# Patient Record
Sex: Female | Born: 1935 | Race: White | Hispanic: No | Smoking: Current every day smoker
Health system: Southern US, Community
[De-identification: ages and names within clinical notes are randomized; demographics above are authoritative.]

## PROBLEM LIST (undated history)

## (undated) DIAGNOSIS — D751 Secondary polycythemia: Secondary | ICD-10-CM

## (undated) DIAGNOSIS — D333 Benign neoplasm of cranial nerves: Secondary | ICD-10-CM

## (undated) DIAGNOSIS — I739 Peripheral vascular disease, unspecified: Secondary | ICD-10-CM

## (undated) DIAGNOSIS — E785 Hyperlipidemia, unspecified: Secondary | ICD-10-CM

## (undated) DIAGNOSIS — Z96651 Presence of right artificial knee joint: Secondary | ICD-10-CM

## (undated) DIAGNOSIS — Z95828 Presence of other vascular implants and grafts: Secondary | ICD-10-CM

## (undated) DIAGNOSIS — Z950 Presence of cardiac pacemaker: Secondary | ICD-10-CM

## (undated) DIAGNOSIS — G459 Transient cerebral ischemic attack, unspecified: Secondary | ICD-10-CM

## (undated) DIAGNOSIS — G8929 Other chronic pain: Secondary | ICD-10-CM

## (undated) DIAGNOSIS — S22000A Wedge compression fracture of unspecified thoracic vertebra, initial encounter for closed fracture: Secondary | ICD-10-CM

## (undated) DIAGNOSIS — S62101A Fracture of unspecified carpal bone, right wrist, initial encounter for closed fracture: Secondary | ICD-10-CM

## (undated) DIAGNOSIS — M549 Dorsalgia, unspecified: Secondary | ICD-10-CM

## (undated) DIAGNOSIS — J449 Chronic obstructive pulmonary disease, unspecified: Secondary | ICD-10-CM

## (undated) HISTORY — DX: Secondary polycythemia: D75.1

## (undated) HISTORY — PX: OTHER SURGICAL HISTORY: SHX169

## (undated) HISTORY — PX: BACK SURGERY: SHX140

## (undated) HISTORY — PX: CHOLECYSTECTOMY: SHX55

## (undated) HISTORY — PX: REPLACEMENT TOTAL KNEE: SUR1224

## (undated) HISTORY — PX: TONSILLECTOMY: SUR1361

## (undated) HISTORY — PX: APPENDECTOMY: SHX54

## (undated) HISTORY — PX: ABDOMINAL HYSTERECTOMY: SHX81

## (undated) HISTORY — DX: Benign neoplasm of cranial nerves: D33.3

---

## 2004-04-22 ENCOUNTER — Other Ambulatory Visit: Payer: Self-pay

## 2004-06-20 ENCOUNTER — Ambulatory Visit: Payer: Self-pay | Admitting: Unknown Physician Specialty

## 2004-07-04 ENCOUNTER — Ambulatory Visit: Payer: Self-pay | Admitting: Unknown Physician Specialty

## 2004-07-30 ENCOUNTER — Ambulatory Visit: Payer: Self-pay | Admitting: Surgery

## 2004-07-30 ENCOUNTER — Other Ambulatory Visit: Payer: Self-pay

## 2004-08-06 ENCOUNTER — Ambulatory Visit: Payer: Self-pay | Admitting: Surgery

## 2004-10-09 ENCOUNTER — Ambulatory Visit: Payer: Self-pay | Admitting: Unknown Physician Specialty

## 2004-10-22 ENCOUNTER — Other Ambulatory Visit: Payer: Self-pay

## 2004-10-28 ENCOUNTER — Inpatient Hospital Stay: Payer: Self-pay | Admitting: Unknown Physician Specialty

## 2005-07-01 ENCOUNTER — Other Ambulatory Visit: Payer: Self-pay

## 2005-07-01 ENCOUNTER — Emergency Department: Payer: Self-pay | Admitting: Emergency Medicine

## 2005-12-25 ENCOUNTER — Other Ambulatory Visit: Payer: Self-pay

## 2005-12-25 ENCOUNTER — Inpatient Hospital Stay: Payer: Self-pay | Admitting: Internal Medicine

## 2005-12-26 ENCOUNTER — Other Ambulatory Visit: Payer: Self-pay

## 2005-12-30 ENCOUNTER — Ambulatory Visit: Payer: Self-pay | Admitting: Internal Medicine

## 2006-01-05 ENCOUNTER — Ambulatory Visit: Payer: Self-pay | Admitting: Internal Medicine

## 2006-02-05 ENCOUNTER — Ambulatory Visit: Payer: Self-pay | Admitting: Internal Medicine

## 2006-02-25 ENCOUNTER — Inpatient Hospital Stay: Payer: Self-pay | Admitting: Internal Medicine

## 2006-03-07 ENCOUNTER — Ambulatory Visit: Payer: Self-pay | Admitting: Internal Medicine

## 2006-04-07 ENCOUNTER — Ambulatory Visit: Payer: Self-pay | Admitting: Internal Medicine

## 2006-04-23 ENCOUNTER — Ambulatory Visit: Payer: Self-pay | Admitting: Unknown Physician Specialty

## 2006-05-08 ENCOUNTER — Ambulatory Visit: Payer: Self-pay | Admitting: Internal Medicine

## 2006-06-16 ENCOUNTER — Other Ambulatory Visit: Payer: Self-pay

## 2006-06-24 ENCOUNTER — Inpatient Hospital Stay: Payer: Self-pay | Admitting: Unknown Physician Specialty

## 2006-06-25 ENCOUNTER — Other Ambulatory Visit: Payer: Self-pay

## 2006-07-01 ENCOUNTER — Ambulatory Visit: Payer: Self-pay | Admitting: Internal Medicine

## 2006-07-14 ENCOUNTER — Ambulatory Visit: Payer: Self-pay | Admitting: Internal Medicine

## 2006-08-11 ENCOUNTER — Ambulatory Visit: Payer: Self-pay | Admitting: Internal Medicine

## 2006-08-13 ENCOUNTER — Ambulatory Visit: Payer: Self-pay | Admitting: Internal Medicine

## 2006-08-19 ENCOUNTER — Ambulatory Visit: Payer: Self-pay | Admitting: Internal Medicine

## 2006-09-07 ENCOUNTER — Ambulatory Visit: Payer: Self-pay | Admitting: Internal Medicine

## 2006-10-08 ENCOUNTER — Ambulatory Visit: Payer: Self-pay | Admitting: Internal Medicine

## 2006-11-06 ENCOUNTER — Ambulatory Visit: Payer: Self-pay | Admitting: Internal Medicine

## 2006-12-07 ENCOUNTER — Ambulatory Visit: Payer: Self-pay | Admitting: Internal Medicine

## 2007-01-06 ENCOUNTER — Ambulatory Visit: Payer: Self-pay | Admitting: Internal Medicine

## 2007-02-06 ENCOUNTER — Ambulatory Visit: Payer: Self-pay | Admitting: Internal Medicine

## 2007-02-16 ENCOUNTER — Ambulatory Visit: Payer: Self-pay

## 2007-02-21 ENCOUNTER — Ambulatory Visit: Payer: Self-pay | Admitting: Internal Medicine

## 2007-03-08 ENCOUNTER — Ambulatory Visit: Payer: Self-pay | Admitting: Internal Medicine

## 2007-04-08 ENCOUNTER — Ambulatory Visit: Payer: Self-pay | Admitting: Internal Medicine

## 2007-05-09 ENCOUNTER — Ambulatory Visit: Payer: Self-pay | Admitting: Internal Medicine

## 2007-06-08 ENCOUNTER — Ambulatory Visit: Payer: Self-pay | Admitting: Internal Medicine

## 2007-07-09 ENCOUNTER — Ambulatory Visit: Payer: Self-pay | Admitting: Internal Medicine

## 2007-08-08 ENCOUNTER — Ambulatory Visit: Payer: Self-pay | Admitting: Internal Medicine

## 2007-09-08 ENCOUNTER — Ambulatory Visit: Payer: Self-pay | Admitting: Internal Medicine

## 2007-10-09 ENCOUNTER — Ambulatory Visit: Payer: Self-pay | Admitting: Internal Medicine

## 2007-11-06 ENCOUNTER — Ambulatory Visit: Payer: Self-pay | Admitting: Internal Medicine

## 2007-12-07 ENCOUNTER — Ambulatory Visit: Payer: Self-pay | Admitting: Internal Medicine

## 2008-01-06 ENCOUNTER — Ambulatory Visit: Payer: Self-pay | Admitting: Internal Medicine

## 2008-02-06 ENCOUNTER — Ambulatory Visit: Payer: Self-pay | Admitting: Internal Medicine

## 2008-02-14 ENCOUNTER — Ambulatory Visit: Payer: Self-pay

## 2008-02-20 ENCOUNTER — Ambulatory Visit: Payer: Self-pay | Admitting: Internal Medicine

## 2008-03-07 ENCOUNTER — Ambulatory Visit: Payer: Self-pay | Admitting: Internal Medicine

## 2008-04-07 ENCOUNTER — Ambulatory Visit: Payer: Self-pay | Admitting: Internal Medicine

## 2008-05-08 ENCOUNTER — Ambulatory Visit: Payer: Self-pay | Admitting: Internal Medicine

## 2008-06-07 ENCOUNTER — Ambulatory Visit: Payer: Self-pay | Admitting: Internal Medicine

## 2008-06-21 ENCOUNTER — Ambulatory Visit: Payer: Self-pay | Admitting: Internal Medicine

## 2008-07-08 ENCOUNTER — Ambulatory Visit: Payer: Self-pay | Admitting: Internal Medicine

## 2008-07-19 ENCOUNTER — Ambulatory Visit: Payer: Self-pay | Admitting: Internal Medicine

## 2008-08-07 ENCOUNTER — Ambulatory Visit: Payer: Self-pay | Admitting: Internal Medicine

## 2008-08-16 ENCOUNTER — Ambulatory Visit: Payer: Self-pay | Admitting: Internal Medicine

## 2008-08-20 ENCOUNTER — Ambulatory Visit: Payer: Self-pay | Admitting: Internal Medicine

## 2008-09-07 ENCOUNTER — Ambulatory Visit: Payer: Self-pay | Admitting: Internal Medicine

## 2008-10-08 ENCOUNTER — Ambulatory Visit: Payer: Self-pay | Admitting: Internal Medicine

## 2008-11-05 ENCOUNTER — Ambulatory Visit: Payer: Self-pay | Admitting: Internal Medicine

## 2008-12-06 ENCOUNTER — Ambulatory Visit: Payer: Self-pay | Admitting: Internal Medicine

## 2009-01-05 ENCOUNTER — Ambulatory Visit: Payer: Self-pay | Admitting: Internal Medicine

## 2009-02-05 ENCOUNTER — Ambulatory Visit: Payer: Self-pay | Admitting: Internal Medicine

## 2009-03-07 ENCOUNTER — Ambulatory Visit: Payer: Self-pay | Admitting: Internal Medicine

## 2009-04-07 ENCOUNTER — Ambulatory Visit: Payer: Self-pay | Admitting: Internal Medicine

## 2009-05-08 ENCOUNTER — Ambulatory Visit: Payer: Self-pay | Admitting: Internal Medicine

## 2009-06-07 ENCOUNTER — Ambulatory Visit: Payer: Self-pay | Admitting: Internal Medicine

## 2009-07-08 ENCOUNTER — Ambulatory Visit: Payer: Self-pay | Admitting: Internal Medicine

## 2009-08-07 ENCOUNTER — Ambulatory Visit: Payer: Self-pay | Admitting: Internal Medicine

## 2009-08-15 ENCOUNTER — Ambulatory Visit: Payer: Self-pay | Admitting: Internal Medicine

## 2009-09-07 ENCOUNTER — Ambulatory Visit: Payer: Self-pay | Admitting: Internal Medicine

## 2009-10-08 ENCOUNTER — Ambulatory Visit: Payer: Self-pay | Admitting: Internal Medicine

## 2009-11-05 ENCOUNTER — Ambulatory Visit: Payer: Self-pay | Admitting: Internal Medicine

## 2009-12-06 ENCOUNTER — Ambulatory Visit: Payer: Self-pay | Admitting: Internal Medicine

## 2009-12-31 ENCOUNTER — Ambulatory Visit: Payer: Self-pay | Admitting: Internal Medicine

## 2010-01-05 ENCOUNTER — Ambulatory Visit: Payer: Self-pay | Admitting: Internal Medicine

## 2010-02-17 ENCOUNTER — Ambulatory Visit: Payer: Self-pay | Admitting: Family Medicine

## 2010-02-27 ENCOUNTER — Ambulatory Visit: Payer: Self-pay | Admitting: Internal Medicine

## 2010-03-07 ENCOUNTER — Ambulatory Visit: Payer: Self-pay | Admitting: Internal Medicine

## 2010-03-07 ENCOUNTER — Ambulatory Visit: Payer: Self-pay | Admitting: Otolaryngology

## 2010-03-08 ENCOUNTER — Ambulatory Visit: Payer: Self-pay | Admitting: Internal Medicine

## 2010-04-07 ENCOUNTER — Ambulatory Visit: Payer: Self-pay | Admitting: Internal Medicine

## 2010-05-08 ENCOUNTER — Ambulatory Visit: Payer: Self-pay | Admitting: Internal Medicine

## 2010-06-07 ENCOUNTER — Ambulatory Visit: Payer: Self-pay | Admitting: Internal Medicine

## 2010-07-08 ENCOUNTER — Ambulatory Visit: Payer: Self-pay

## 2010-07-08 ENCOUNTER — Ambulatory Visit: Payer: Self-pay | Admitting: Internal Medicine

## 2010-08-07 ENCOUNTER — Ambulatory Visit: Payer: Self-pay | Admitting: Internal Medicine

## 2010-09-07 ENCOUNTER — Ambulatory Visit: Payer: Self-pay | Admitting: Internal Medicine

## 2010-10-08 ENCOUNTER — Ambulatory Visit: Payer: Self-pay | Admitting: Internal Medicine

## 2010-11-06 ENCOUNTER — Ambulatory Visit: Payer: Self-pay | Admitting: Internal Medicine

## 2010-12-07 ENCOUNTER — Ambulatory Visit: Payer: Self-pay | Admitting: Internal Medicine

## 2011-01-06 ENCOUNTER — Ambulatory Visit: Payer: Self-pay | Admitting: Internal Medicine

## 2011-02-06 ENCOUNTER — Ambulatory Visit: Payer: Self-pay | Admitting: Internal Medicine

## 2011-03-08 ENCOUNTER — Ambulatory Visit: Payer: Self-pay | Admitting: Internal Medicine

## 2011-04-08 ENCOUNTER — Ambulatory Visit: Payer: Self-pay | Admitting: Internal Medicine

## 2011-05-09 ENCOUNTER — Ambulatory Visit: Payer: Self-pay | Admitting: Internal Medicine

## 2011-06-08 ENCOUNTER — Ambulatory Visit: Payer: Self-pay | Admitting: Internal Medicine

## 2011-07-09 ENCOUNTER — Ambulatory Visit: Payer: Self-pay | Admitting: Internal Medicine

## 2011-07-14 ENCOUNTER — Ambulatory Visit: Payer: Self-pay | Admitting: Vascular Surgery

## 2011-08-05 ENCOUNTER — Ambulatory Visit: Payer: Self-pay | Admitting: Internal Medicine

## 2011-08-08 ENCOUNTER — Ambulatory Visit: Payer: Self-pay | Admitting: Internal Medicine

## 2011-09-08 ENCOUNTER — Ambulatory Visit: Payer: Self-pay | Admitting: Internal Medicine

## 2011-09-17 LAB — CANCER CENTER HEMATOCRIT: HCT: 42.7 % (ref 35.0–47.0)

## 2011-10-08 LAB — CANCER CENTER HEMATOCRIT: HCT: 43.3 % (ref 35.0–47.0)

## 2011-10-09 ENCOUNTER — Ambulatory Visit: Payer: Self-pay | Admitting: Internal Medicine

## 2011-11-05 LAB — CBC CANCER CENTER
Basophil #: 0 x10 3/mm (ref 0.0–0.1)
Basophil %: 1.1 %
Eosinophil #: 0.1 x10 3/mm (ref 0.0–0.7)
HCT: 40 % (ref 35.0–47.0)
Lymphocyte #: 1.2 x10 3/mm (ref 1.0–3.6)
Lymphocyte %: 29.9 %
MCH: 24.1 pg — ABNORMAL LOW (ref 26.0–34.0)
MCHC: 32.2 g/dL (ref 32.0–36.0)
MCV: 75 fL — ABNORMAL LOW (ref 80–100)
Monocyte #: 0.5 x10 3/mm (ref 0.0–0.7)
Neutrophil #: 2.2 x10 3/mm (ref 1.4–6.5)
Neutrophil %: 55.4 %
RBC: 5.33 10*6/uL — ABNORMAL HIGH (ref 3.80–5.20)
RDW: 17.4 % — ABNORMAL HIGH (ref 11.5–14.5)
WBC: 4.1 x10 3/mm (ref 3.6–11.0)

## 2011-11-06 ENCOUNTER — Ambulatory Visit: Payer: Self-pay | Admitting: Internal Medicine

## 2011-12-03 LAB — CBC CANCER CENTER
Basophil #: 0 x10 3/mm (ref 0.0–0.1)
Eosinophil #: 0.1 x10 3/mm (ref 0.0–0.7)
Eosinophil %: 1.6 %
HGB: 13.3 g/dL (ref 12.0–16.0)
MCH: 23.9 pg — ABNORMAL LOW (ref 26.0–34.0)
Monocyte #: 0.4 x10 3/mm (ref 0.0–0.7)
Monocyte %: 10.1 %
RBC: 5.55 10*6/uL — ABNORMAL HIGH (ref 3.80–5.20)

## 2011-12-07 ENCOUNTER — Ambulatory Visit: Payer: Self-pay | Admitting: Internal Medicine

## 2011-12-17 LAB — CANCER CENTER HEMATOCRIT: HCT: 41.7 % (ref 35.0–47.0)

## 2012-01-06 ENCOUNTER — Ambulatory Visit: Payer: Self-pay | Admitting: Internal Medicine

## 2012-01-14 LAB — CBC CANCER CENTER
Basophil %: 1.4 %
Eosinophil %: 2.3 %
HCT: 42.9 % (ref 35.0–47.0)
HGB: 13.5 g/dL (ref 12.0–16.0)
Lymphocyte #: 1.3 x10 3/mm (ref 1.0–3.6)
Lymphocyte %: 30.1 %
MCH: 23.9 pg — ABNORMAL LOW (ref 26.0–34.0)
Monocyte #: 0.6 x10 3/mm (ref 0.2–0.9)
Monocyte %: 14.4 %
Neutrophil #: 2.2 x10 3/mm (ref 1.4–6.5)
RBC: 5.65 10*6/uL — ABNORMAL HIGH (ref 3.80–5.20)
RDW: 19.4 % — ABNORMAL HIGH (ref 11.5–14.5)

## 2012-02-06 ENCOUNTER — Ambulatory Visit: Payer: Self-pay | Admitting: Internal Medicine

## 2012-02-19 LAB — CBC CANCER CENTER
Basophil #: 0 x10 3/mm (ref 0.0–0.1)
Eosinophil #: 0.1 x10 3/mm (ref 0.0–0.7)
Eosinophil %: 2.3 %
HCT: 41.8 % (ref 35.0–47.0)
Lymphocyte #: 1.3 x10 3/mm (ref 1.0–3.6)
MCH: 24 pg — ABNORMAL LOW (ref 26.0–34.0)
Monocyte #: 0.6 x10 3/mm (ref 0.2–0.9)
Monocyte %: 12 %
Neutrophil #: 2.6 x10 3/mm (ref 1.4–6.5)
Platelet: 369 x10 3/mm (ref 150–440)
WBC: 4.6 x10 3/mm (ref 3.6–11.0)

## 2012-03-07 ENCOUNTER — Ambulatory Visit: Payer: Self-pay | Admitting: Internal Medicine

## 2012-03-18 LAB — CBC CANCER CENTER
Basophil #: 0 x10 3/mm (ref 0.0–0.1)
Eosinophil #: 0.1 x10 3/mm (ref 0.0–0.7)
HCT: 40 % (ref 35.0–47.0)
Lymphocyte #: 1.3 x10 3/mm (ref 1.0–3.6)
Lymphocyte %: 30.8 %
MCH: 23.7 pg — ABNORMAL LOW (ref 26.0–34.0)
MCHC: 31.3 g/dL — ABNORMAL LOW (ref 32.0–36.0)
Monocyte %: 13.6 %
Platelet: 335 x10 3/mm (ref 150–440)
RDW: 17.9 % — ABNORMAL HIGH (ref 11.5–14.5)
WBC: 4.1 x10 3/mm (ref 3.6–11.0)

## 2012-04-07 ENCOUNTER — Ambulatory Visit: Payer: Self-pay | Admitting: Internal Medicine

## 2012-04-08 LAB — CBC CANCER CENTER
Basophil #: 0.1 x10 3/mm (ref 0.0–0.1)
Eosinophil #: 0.1 x10 3/mm (ref 0.0–0.7)
HGB: 13.5 g/dL (ref 12.0–16.0)
Lymphocyte %: 23.5 %
MCH: 24.3 pg — ABNORMAL LOW (ref 26.0–34.0)
MCHC: 31.7 g/dL — ABNORMAL LOW (ref 32.0–36.0)
Neutrophil #: 3.1 x10 3/mm (ref 1.4–6.5)
Neutrophil %: 62.1 %
Platelet: 333 x10 3/mm (ref 150–440)
WBC: 4.9 x10 3/mm (ref 3.6–11.0)

## 2012-05-08 ENCOUNTER — Ambulatory Visit: Payer: Self-pay | Admitting: Internal Medicine

## 2012-05-20 LAB — CANCER CENTER HEMATOCRIT: HCT: 41 % (ref 35.0–47.0)

## 2012-06-07 ENCOUNTER — Ambulatory Visit: Payer: Self-pay | Admitting: Internal Medicine

## 2012-07-08 ENCOUNTER — Ambulatory Visit: Payer: Self-pay | Admitting: Internal Medicine

## 2012-07-08 LAB — CBC CANCER CENTER
Basophil %: 1.4 %
Eosinophil #: 0.1 x10 3/mm (ref 0.0–0.7)
Eosinophil %: 2.1 %
HGB: 13.2 g/dL (ref 12.0–16.0)
Lymphocyte %: 31.3 %
MCHC: 31.2 g/dL — ABNORMAL LOW (ref 32.0–36.0)
MCV: 78 fL — ABNORMAL LOW (ref 80–100)
Monocyte %: 13.6 %
Neutrophil #: 2.3 x10 3/mm (ref 1.4–6.5)
Neutrophil %: 51.6 %
RBC: 5.45 10*6/uL — ABNORMAL HIGH (ref 3.80–5.20)
RDW: 18.3 % — ABNORMAL HIGH (ref 11.5–14.5)

## 2012-08-07 ENCOUNTER — Ambulatory Visit: Payer: Self-pay | Admitting: Internal Medicine

## 2012-08-19 LAB — CANCER CENTER HEMATOCRIT: HCT: 40.6 % (ref 35.0–47.0)

## 2012-09-07 ENCOUNTER — Ambulatory Visit: Payer: Self-pay | Admitting: Internal Medicine

## 2012-09-23 LAB — CBC CANCER CENTER
Basophil #: 0 x10 3/mm (ref 0.0–0.1)
Eosinophil %: 2 %
Lymphocyte #: 1.3 x10 3/mm (ref 1.0–3.6)
MCHC: 32.3 g/dL (ref 32.0–36.0)
Monocyte %: 11.7 %
Neutrophil #: 1.8 x10 3/mm (ref 1.4–6.5)
Neutrophil %: 50.4 %
Platelet: 278 x10 3/mm (ref 150–440)
WBC: 3.6 x10 3/mm (ref 3.6–11.0)

## 2012-10-08 ENCOUNTER — Ambulatory Visit: Payer: Self-pay | Admitting: Internal Medicine

## 2012-10-27 ENCOUNTER — Encounter: Payer: Self-pay | Admitting: Unknown Physician Specialty

## 2012-11-05 ENCOUNTER — Ambulatory Visit: Payer: Self-pay | Admitting: Internal Medicine

## 2012-11-05 ENCOUNTER — Encounter: Payer: Self-pay | Admitting: Unknown Physician Specialty

## 2012-11-17 LAB — CBC CANCER CENTER
Basophil %: 1 %
Eosinophil #: 0.1 x10 3/mm (ref 0.0–0.7)
Eosinophil %: 1.5 %
HCT: 44.1 % (ref 35.0–47.0)
HGB: 14.2 g/dL (ref 12.0–16.0)
Lymphocyte #: 1.3 x10 3/mm (ref 1.0–3.6)
MCH: 26.3 pg (ref 26.0–34.0)
MCHC: 32.2 g/dL (ref 32.0–36.0)
MCV: 81 fL (ref 80–100)
Neutrophil #: 2.3 x10 3/mm (ref 1.4–6.5)
Neutrophil %: 54.7 %
RBC: 5.42 10*6/uL — ABNORMAL HIGH (ref 3.80–5.20)
RDW: 18.9 % — ABNORMAL HIGH (ref 11.5–14.5)
WBC: 4.2 x10 3/mm (ref 3.6–11.0)

## 2012-12-06 ENCOUNTER — Ambulatory Visit: Payer: Self-pay | Admitting: Internal Medicine

## 2012-12-08 LAB — CANCER CENTER HEMATOCRIT: HCT: 43.1 % (ref 35.0–47.0)

## 2012-12-09 DIAGNOSIS — R011 Cardiac murmur, unspecified: Secondary | ICD-10-CM | POA: Insufficient documentation

## 2013-01-05 ENCOUNTER — Ambulatory Visit: Payer: Self-pay | Admitting: Internal Medicine

## 2013-01-05 LAB — CBC CANCER CENTER
Eosinophil #: 0.1 x10 3/mm (ref 0.0–0.7)
Eosinophil %: 1.6 %
HCT: 42.6 % (ref 35.0–47.0)
HGB: 13.5 g/dL (ref 12.0–16.0)
Lymphocyte %: 30.4 %
MCH: 25.9 pg — ABNORMAL LOW (ref 26.0–34.0)
MCHC: 31.7 g/dL — ABNORMAL LOW (ref 32.0–36.0)
MCV: 82 fL (ref 80–100)
Monocyte %: 12 %
Neutrophil #: 2.2 x10 3/mm (ref 1.4–6.5)
Neutrophil %: 54.5 %
RBC: 5.21 10*6/uL — ABNORMAL HIGH (ref 3.80–5.20)
RDW: 16.8 % — ABNORMAL HIGH (ref 11.5–14.5)

## 2013-01-22 ENCOUNTER — Emergency Department: Payer: Self-pay | Admitting: Emergency Medicine

## 2013-01-26 LAB — CBC CANCER CENTER
Basophil #: 0 x10 3/mm (ref 0.0–0.1)
Basophil %: 0.4 %
Eosinophil %: 2 %
HCT: 40.5 % (ref 35.0–47.0)
HGB: 13.1 g/dL (ref 12.0–16.0)
Lymphocyte %: 32.1 %
MCH: 26.4 pg (ref 26.0–34.0)
MCHC: 32.4 g/dL (ref 32.0–36.0)
Monocyte #: 0.7 x10 3/mm (ref 0.2–0.9)
Monocyte %: 14 %
RBC: 4.97 10*6/uL (ref 3.80–5.20)

## 2013-02-05 ENCOUNTER — Ambulatory Visit: Payer: Self-pay | Admitting: Internal Medicine

## 2013-02-16 LAB — CBC CANCER CENTER
Basophil #: 0.1 x10 3/mm (ref 0.0–0.1)
Basophil %: 1.2 %
Eosinophil #: 0.1 x10 3/mm (ref 0.0–0.7)
HCT: 41.6 % (ref 35.0–47.0)
HGB: 13.8 g/dL (ref 12.0–16.0)
Lymphocyte #: 1.5 x10 3/mm (ref 1.0–3.6)
Lymphocyte %: 28 %
Monocyte #: 0.6 x10 3/mm (ref 0.2–0.9)
Neutrophil %: 58.6 %
Platelet: 321 x10 3/mm (ref 150–440)
RBC: 5.17 10*6/uL (ref 3.80–5.20)

## 2013-03-07 ENCOUNTER — Ambulatory Visit: Payer: Self-pay | Admitting: Internal Medicine

## 2013-03-29 LAB — CBC CANCER CENTER
Basophil #: 0.1 x10 3/mm (ref 0.0–0.1)
Basophil %: 0.8 %
Lymphocyte #: 1.5 x10 3/mm (ref 1.0–3.6)
Lymphocyte %: 24.6 %
MCV: 81 fL (ref 80–100)
Monocyte #: 0.7 x10 3/mm (ref 0.2–0.9)
Monocyte %: 12.1 %
Neutrophil #: 3.7 x10 3/mm (ref 1.4–6.5)
Platelet: 491 x10 3/mm — ABNORMAL HIGH (ref 150–440)
RBC: 5.38 10*6/uL — ABNORMAL HIGH (ref 3.80–5.20)
RDW: 18.2 % — ABNORMAL HIGH (ref 11.5–14.5)

## 2013-04-07 ENCOUNTER — Ambulatory Visit: Payer: Self-pay | Admitting: Internal Medicine

## 2013-04-19 LAB — CBC CANCER CENTER
Eosinophil %: 2 %
HCT: 42.3 % (ref 35.0–47.0)
HGB: 13.7 g/dL (ref 12.0–16.0)
Lymphocyte %: 24.6 %
MCV: 80 fL (ref 80–100)
Monocyte %: 10.9 %
Neutrophil #: 3.2 x10 3/mm (ref 1.4–6.5)
Platelet: 341 x10 3/mm (ref 150–440)
RBC: 5.27 10*6/uL — ABNORMAL HIGH (ref 3.80–5.20)
WBC: 5.3 x10 3/mm (ref 3.6–11.0)

## 2013-05-08 ENCOUNTER — Ambulatory Visit: Payer: Self-pay | Admitting: Internal Medicine

## 2013-05-17 LAB — CBC CANCER CENTER
Basophil #: 0.1 x10 3/mm (ref 0.0–0.1)
Basophil %: 1.1 %
Eosinophil %: 1.5 %
HGB: 14 g/dL (ref 12.0–16.0)
Lymphocyte #: 1.2 x10 3/mm (ref 1.0–3.6)
Lymphocyte %: 19.5 %
MCV: 80 fL (ref 80–100)
Monocyte #: 0.7 x10 3/mm (ref 0.2–0.9)
Neutrophil #: 4.1 x10 3/mm (ref 1.4–6.5)
Neutrophil %: 66.9 %
Platelet: 324 x10 3/mm (ref 150–440)
RBC: 5.38 10*6/uL — ABNORMAL HIGH (ref 3.80–5.20)
WBC: 6.1 x10 3/mm (ref 3.6–11.0)

## 2013-06-07 ENCOUNTER — Ambulatory Visit: Payer: Self-pay | Admitting: Internal Medicine

## 2013-06-21 LAB — CBC CANCER CENTER
Basophil #: 0 10*3/uL
Basophil %: 1.3 %
Eosinophil #: 0.1 10*3/uL
Eosinophil %: 2.2 %
HCT: 43 %
HGB: 13.8 g/dL
Lymphocyte %: 28.9 %
Lymphs Abs: 1.1 10*3/uL
MCH: 26.2 pg
MCHC: 32.2 g/dL
MCV: 82 fL
Monocyte #: 0.5 10*3/uL
Monocyte %: 13.6 %
Neutrophil #: 2.1 10*3/uL
Neutrophil %: 54 %
Platelet: 390 10*3/uL
RBC: 5.27 10*6/uL — ABNORMAL HIGH
RDW: 18.1 % — ABNORMAL HIGH
WBC: 4 10*3/uL

## 2013-06-27 DIAGNOSIS — I739 Peripheral vascular disease, unspecified: Secondary | ICD-10-CM | POA: Insufficient documentation

## 2013-06-28 LAB — COMPREHENSIVE METABOLIC PANEL
Albumin: 3 g/dL — ABNORMAL LOW (ref 3.4–5.0)
Alkaline Phosphatase: 80 U/L (ref 50–136)
Anion Gap: 7 (ref 7–16)
BUN: 6 mg/dL — ABNORMAL LOW (ref 7–18)
Bilirubin,Total: 0.3 mg/dL (ref 0.2–1.0)
Chloride: 105 mmol/L (ref 98–107)
Co2: 30 mmol/L (ref 21–32)
Creatinine: 0.92 mg/dL (ref 0.60–1.30)
EGFR (African American): >60
EGFR (Non-African Amer.): 56 — ABNORMAL LOW
Glucose: 92 mg/dL (ref 65–99)
SGPT (ALT): 16 U/L (ref 12–78)
Sodium: 142 mmol/L (ref 136–145)
Total Protein: 6.2 g/dL — ABNORMAL LOW (ref 6.4–8.2)

## 2013-06-28 LAB — LIPID PANEL
HDL Cholesterol: 45 mg/dL (ref 40–60)
Ldl Cholesterol, Calc: 21 mg/dL (ref 0–100)
VLDL Cholesterol, Calc: 31 mg/dL (ref 5–40)

## 2013-06-28 LAB — CANCER CENTER HEMATOCRIT: HCT: 43.2 % (ref 35.0–47.0)

## 2013-07-06 DIAGNOSIS — G8929 Other chronic pain: Secondary | ICD-10-CM | POA: Insufficient documentation

## 2013-07-08 ENCOUNTER — Ambulatory Visit: Payer: Self-pay | Admitting: Internal Medicine

## 2013-08-07 ENCOUNTER — Ambulatory Visit: Payer: Self-pay | Admitting: Internal Medicine

## 2013-08-23 LAB — CBC CANCER CENTER
Eosinophil %: 2.2 %
HCT: 42.3 % (ref 35.0–47.0)
HGB: 13.1 g/dL (ref 12.0–16.0)
Lymphocyte #: 1.4 x10 3/mm (ref 1.0–3.6)
Lymphocyte %: 38.6 %
MCHC: 31 g/dL — ABNORMAL LOW (ref 32.0–36.0)
MCV: 81 fL (ref 80–100)
Monocyte %: 11.1 %
Neutrophil %: 46.9 %
Platelet: 272 x10 3/mm (ref 150–440)
RBC: 5.2 10*6/uL (ref 3.80–5.20)
WBC: 3.7 x10 3/mm (ref 3.6–11.0)

## 2013-09-06 LAB — CBC CANCER CENTER
Basophil #: 0.1 x10 3/mm (ref 0.0–0.1)
Eosinophil %: 1.1 %
HCT: 44.4 % (ref 35.0–47.0)
HGB: 13.8 g/dL (ref 12.0–16.0)
Lymphocyte #: 1.1 x10 3/mm (ref 1.0–3.6)
MCHC: 31.1 g/dL — ABNORMAL LOW (ref 32.0–36.0)
MCV: 81 fL (ref 80–100)
Monocyte #: 0.6 x10 3/mm (ref 0.2–0.9)
Monocyte %: 12.7 %
Neutrophil %: 63.4 %
RBC: 5.48 10*6/uL — ABNORMAL HIGH (ref 3.80–5.20)
RDW: 17.1 % — ABNORMAL HIGH (ref 11.5–14.5)

## 2013-09-07 ENCOUNTER — Ambulatory Visit: Payer: Self-pay | Admitting: Internal Medicine

## 2013-09-28 LAB — CBC CANCER CENTER
BASOS PCT: 1.3 %
Basophil #: 0.1 x10 3/mm (ref 0.0–0.1)
EOS ABS: 0.1 x10 3/mm (ref 0.0–0.7)
Eosinophil %: 1.3 %
HCT: 43 % (ref 35.0–47.0)
HGB: 13.4 g/dL (ref 12.0–16.0)
LYMPHS ABS: 1.3 x10 3/mm (ref 1.0–3.6)
LYMPHS PCT: 25.9 %
MCH: 24.9 pg — AB (ref 26.0–34.0)
MCHC: 31.2 g/dL — ABNORMAL LOW (ref 32.0–36.0)
MCV: 80 fL (ref 80–100)
MONOS PCT: 10.9 %
Monocyte #: 0.5 x10 3/mm (ref 0.2–0.9)
Neutrophil #: 3 x10 3/mm (ref 1.4–6.5)
Neutrophil %: 60.6 %
Platelet: 332 x10 3/mm (ref 150–440)
RBC: 5.4 10*6/uL — ABNORMAL HIGH (ref 3.80–5.20)
RDW: 16.9 % — AB (ref 11.5–14.5)
WBC: 5 x10 3/mm (ref 3.6–11.0)

## 2013-10-08 ENCOUNTER — Ambulatory Visit: Payer: Self-pay | Admitting: Internal Medicine

## 2013-11-09 ENCOUNTER — Ambulatory Visit: Payer: Self-pay | Admitting: Internal Medicine

## 2013-11-10 LAB — CBC CANCER CENTER
BASOS ABS: 0 x10 3/mm (ref 0.0–0.1)
Basophil %: 0.6 %
EOS ABS: 0.1 x10 3/mm (ref 0.0–0.7)
EOS PCT: 1.3 %
HCT: 42.6 % (ref 35.0–47.0)
HGB: 13.3 g/dL (ref 12.0–16.0)
Lymphocyte #: 1.4 x10 3/mm (ref 1.0–3.6)
Lymphocyte %: 23.5 %
MCH: 23.4 pg — ABNORMAL LOW (ref 26.0–34.0)
MCHC: 31.1 g/dL — AB (ref 32.0–36.0)
MCV: 75 fL — ABNORMAL LOW (ref 80–100)
Monocyte #: 0.6 x10 3/mm (ref 0.2–0.9)
Monocyte %: 10.9 %
NEUTROS PCT: 63.7 %
Neutrophil #: 3.8 x10 3/mm (ref 1.4–6.5)
PLATELETS: 442 x10 3/mm — AB (ref 150–440)
RBC: 5.66 10*6/uL — AB (ref 3.80–5.20)
RDW: 17.1 % — ABNORMAL HIGH (ref 11.5–14.5)
WBC: 5.9 x10 3/mm (ref 3.6–11.0)

## 2013-11-24 LAB — CBC CANCER CENTER
Basophil #: 0 x10 3/mm (ref 0.0–0.1)
Basophil %: 0.2 %
EOS PCT: 1.7 %
Eosinophil #: 0.1 x10 3/mm (ref 0.0–0.7)
HCT: 42.3 % (ref 35.0–47.0)
HGB: 13.1 g/dL (ref 12.0–16.0)
LYMPHS ABS: 1.2 x10 3/mm (ref 1.0–3.6)
Lymphocyte %: 29.8 %
MCH: 23.2 pg — ABNORMAL LOW (ref 26.0–34.0)
MCHC: 30.8 g/dL — ABNORMAL LOW (ref 32.0–36.0)
MCV: 75 fL — AB (ref 80–100)
Monocyte #: 0.5 x10 3/mm (ref 0.2–0.9)
Monocyte %: 12.3 %
Neutrophil #: 2.3 x10 3/mm (ref 1.4–6.5)
Neutrophil %: 56 %
Platelet: 312 x10 3/mm (ref 150–440)
RBC: 5.62 10*6/uL — AB (ref 3.80–5.20)
RDW: 17.5 % — ABNORMAL HIGH (ref 11.5–14.5)
WBC: 4.1 x10 3/mm (ref 3.6–11.0)

## 2013-12-06 ENCOUNTER — Ambulatory Visit: Payer: Self-pay | Admitting: Internal Medicine

## 2013-12-08 ENCOUNTER — Ambulatory Visit: Payer: Self-pay | Admitting: Internal Medicine

## 2013-12-08 LAB — CBC CANCER CENTER
BASOS PCT: 1.3 %
Basophil #: 0.1 x10 3/mm (ref 0.0–0.1)
EOS PCT: 1.5 %
Eosinophil #: 0.1 x10 3/mm (ref 0.0–0.7)
HCT: 41 % (ref 35.0–47.0)
HGB: 12.6 g/dL (ref 12.0–16.0)
LYMPHS ABS: 1.5 x10 3/mm (ref 1.0–3.6)
LYMPHS PCT: 21.8 %
MCH: 22.7 pg — ABNORMAL LOW (ref 26.0–34.0)
MCHC: 30.8 g/dL — ABNORMAL LOW (ref 32.0–36.0)
MCV: 74 fL — ABNORMAL LOW (ref 80–100)
MONO ABS: 1 x10 3/mm — AB (ref 0.2–0.9)
Monocyte %: 14.3 %
Neutrophil #: 4.3 x10 3/mm (ref 1.4–6.5)
Neutrophil %: 61.1 %
Platelet: 447 x10 3/mm — ABNORMAL HIGH (ref 150–440)
RBC: 5.55 10*6/uL — AB (ref 3.80–5.20)
RDW: 17.7 % — AB (ref 11.5–14.5)
WBC: 7 x10 3/mm (ref 3.6–11.0)

## 2014-01-03 ENCOUNTER — Ambulatory Visit: Payer: Self-pay | Admitting: Family Medicine

## 2014-01-05 ENCOUNTER — Ambulatory Visit: Payer: Self-pay | Admitting: Internal Medicine

## 2014-01-12 LAB — CBC CANCER CENTER
BASOS ABS: 0.1 x10 3/mm (ref 0.0–0.1)
Basophil %: 1.3 %
Eosinophil #: 0.1 x10 3/mm (ref 0.0–0.7)
Eosinophil %: 1.4 %
HCT: 41.5 % (ref 35.0–47.0)
HGB: 13.2 g/dL (ref 12.0–16.0)
LYMPHS ABS: 1.4 x10 3/mm (ref 1.0–3.6)
Lymphocyte %: 20.6 %
MCH: 22.7 pg — ABNORMAL LOW (ref 26.0–34.0)
MCHC: 31.7 g/dL — AB (ref 32.0–36.0)
MCV: 72 fL — AB (ref 80–100)
Monocyte #: 0.7 x10 3/mm (ref 0.2–0.9)
Monocyte %: 10.6 %
NEUTROS PCT: 66.1 %
Neutrophil #: 4.5 x10 3/mm (ref 1.4–6.5)
PLATELETS: 460 x10 3/mm — AB (ref 150–440)
RBC: 5.81 10*6/uL — ABNORMAL HIGH (ref 3.80–5.20)
RDW: 18.4 % — AB (ref 11.5–14.5)
WBC: 6.8 x10 3/mm (ref 3.6–11.0)

## 2014-01-12 LAB — COMPREHENSIVE METABOLIC PANEL
ANION GAP: 5 — AB (ref 7–16)
Albumin: 3.5 g/dL (ref 3.4–5.0)
Alkaline Phosphatase: 113 U/L
BUN: 14 mg/dL (ref 7–18)
Bilirubin,Total: 0.4 mg/dL (ref 0.2–1.0)
CO2: 31 mmol/L (ref 21–32)
Calcium, Total: 9 mg/dL (ref 8.5–10.1)
Chloride: 102 mmol/L (ref 98–107)
Creatinine: 1.27 mg/dL (ref 0.60–1.30)
EGFR (Non-African Amer.): 38 — ABNORMAL LOW
GFR CALC AF AMER: 44 — AB
Glucose: 114 mg/dL — ABNORMAL HIGH (ref 65–99)
OSMOLALITY: 277 (ref 275–301)
POTASSIUM: 3.1 mmol/L — AB (ref 3.5–5.1)
SGOT(AST): 22 U/L (ref 15–37)
SGPT (ALT): 12 U/L (ref 12–78)
Sodium: 138 mmol/L (ref 136–145)
Total Protein: 7.3 g/dL (ref 6.4–8.2)

## 2014-01-12 LAB — LIPID PANEL
Cholesterol: 102 mg/dL (ref 0–200)
HDL Cholesterol: 36 mg/dL — ABNORMAL LOW (ref 40–60)
LDL CHOLESTEROL, CALC: 26 mg/dL (ref 0–100)
Triglycerides: 200 mg/dL (ref 0–200)
VLDL Cholesterol, Calc: 40 mg/dL (ref 5–40)

## 2014-02-05 ENCOUNTER — Ambulatory Visit: Payer: Self-pay | Admitting: Internal Medicine

## 2014-02-09 LAB — CBC CANCER CENTER
BASOS PCT: 2.4 %
Basophil #: 0.2 x10 3/mm — ABNORMAL HIGH (ref 0.0–0.1)
EOS ABS: 0.1 x10 3/mm (ref 0.0–0.7)
EOS PCT: 1.8 %
HCT: 40.8 % (ref 35.0–47.0)
HGB: 12.7 g/dL (ref 12.0–16.0)
Lymphocyte #: 1.5 x10 3/mm (ref 1.0–3.6)
Lymphocyte %: 22.6 %
MCH: 22.1 pg — ABNORMAL LOW (ref 26.0–34.0)
MCHC: 31.1 g/dL — ABNORMAL LOW (ref 32.0–36.0)
MCV: 71 fL — ABNORMAL LOW (ref 80–100)
MONOS PCT: 9.9 %
Monocyte #: 0.7 x10 3/mm (ref 0.2–0.9)
NEUTROS ABS: 4.2 x10 3/mm (ref 1.4–6.5)
NEUTROS PCT: 63.3 %
Platelet: 541 x10 3/mm — ABNORMAL HIGH (ref 150–440)
RBC: 5.74 10*6/uL — ABNORMAL HIGH (ref 3.80–5.20)
RDW: 19.4 % — AB (ref 11.5–14.5)
WBC: 6.6 x10 3/mm (ref 3.6–11.0)

## 2014-03-02 LAB — CBC CANCER CENTER
Basophil #: 0.1 x10 3/mm (ref 0.0–0.1)
Basophil %: 1.4 %
EOS ABS: 0.1 x10 3/mm (ref 0.0–0.7)
Eosinophil %: 1.3 %
HCT: 42.1 % (ref 35.0–47.0)
HGB: 13.1 g/dL (ref 12.0–16.0)
Lymphocyte #: 1.6 x10 3/mm (ref 1.0–3.6)
Lymphocyte %: 26.9 %
MCH: 22.2 pg — AB (ref 26.0–34.0)
MCHC: 31.1 g/dL — AB (ref 32.0–36.0)
MCV: 71 fL — ABNORMAL LOW (ref 80–100)
Monocyte #: 0.7 x10 3/mm (ref 0.2–0.9)
Monocyte %: 11.3 %
Neutrophil #: 3.5 x10 3/mm (ref 1.4–6.5)
Neutrophil %: 59.1 %
PLATELETS: 269 x10 3/mm (ref 150–440)
RBC: 5.91 10*6/uL — ABNORMAL HIGH (ref 3.80–5.20)
RDW: 20.4 % — ABNORMAL HIGH (ref 11.5–14.5)
WBC: 5.8 x10 3/mm (ref 3.6–11.0)

## 2014-03-07 ENCOUNTER — Ambulatory Visit: Payer: Self-pay | Admitting: Internal Medicine

## 2014-03-15 DIAGNOSIS — M8580 Other specified disorders of bone density and structure, unspecified site: Secondary | ICD-10-CM | POA: Insufficient documentation

## 2014-03-23 LAB — CBC CANCER CENTER
BASOS PCT: 1.2 %
Basophil #: 0.1 x10 3/mm (ref 0.0–0.1)
Eosinophil #: 0.1 x10 3/mm (ref 0.0–0.7)
Eosinophil %: 1.3 %
HCT: 43.4 % (ref 35.0–47.0)
HGB: 13.5 g/dL (ref 12.0–16.0)
Lymphocyte #: 1.5 x10 3/mm (ref 1.0–3.6)
Lymphocyte %: 20.8 %
MCH: 22.6 pg — ABNORMAL LOW (ref 26.0–34.0)
MCHC: 31.2 g/dL — ABNORMAL LOW (ref 32.0–36.0)
MCV: 72 fL — ABNORMAL LOW (ref 80–100)
Monocyte #: 0.9 x10 3/mm (ref 0.2–0.9)
Monocyte %: 11.8 %
Neutrophil #: 4.7 x10 3/mm (ref 1.4–6.5)
Neutrophil %: 64.9 %
PLATELETS: 398 x10 3/mm (ref 150–440)
RBC: 6.01 10*6/uL — ABNORMAL HIGH (ref 3.80–5.20)
RDW: 21.7 % — AB (ref 11.5–14.5)
WBC: 7.3 x10 3/mm (ref 3.6–11.0)

## 2014-04-02 DIAGNOSIS — R634 Abnormal weight loss: Secondary | ICD-10-CM | POA: Insufficient documentation

## 2014-04-02 DIAGNOSIS — R413 Other amnesia: Secondary | ICD-10-CM | POA: Insufficient documentation

## 2014-04-07 ENCOUNTER — Ambulatory Visit: Payer: Self-pay | Admitting: Internal Medicine

## 2014-04-16 ENCOUNTER — Other Ambulatory Visit (HOSPITAL_COMMUNITY): Payer: Self-pay | Admitting: Internal Medicine

## 2014-04-20 LAB — CBC CANCER CENTER
BASOS ABS: 0.1 x10 3/mm (ref 0.0–0.1)
Basophil %: 1.1 %
EOS ABS: 0.1 x10 3/mm (ref 0.0–0.7)
EOS PCT: 2.2 %
HCT: 41.8 % (ref 35.0–47.0)
HGB: 13 g/dL (ref 12.0–16.0)
LYMPHS ABS: 1.7 x10 3/mm (ref 1.0–3.6)
Lymphocyte %: 25.6 %
MCH: 23.4 pg — ABNORMAL LOW (ref 26.0–34.0)
MCHC: 31.1 g/dL — ABNORMAL LOW (ref 32.0–36.0)
MCV: 75 fL — AB (ref 80–100)
Monocyte #: 0.7 x10 3/mm (ref 0.2–0.9)
Monocyte %: 10.9 %
NEUTROS ABS: 4.1 x10 3/mm (ref 1.4–6.5)
NEUTROS PCT: 60.2 %
Platelet: 469 x10 3/mm — ABNORMAL HIGH (ref 150–440)
RBC: 5.57 10*6/uL — ABNORMAL HIGH (ref 3.80–5.20)
RDW: 23.4 % — ABNORMAL HIGH (ref 11.5–14.5)
WBC: 6.8 x10 3/mm (ref 3.6–11.0)

## 2014-05-08 ENCOUNTER — Ambulatory Visit: Payer: Self-pay | Admitting: Internal Medicine

## 2014-05-23 LAB — CBC CANCER CENTER
BASOS ABS: 0.1 x10 3/mm (ref 0.0–0.1)
BASOS PCT: 1.4 %
EOS ABS: 0.1 x10 3/mm (ref 0.0–0.7)
Eosinophil %: 1.4 %
HCT: 43.6 % (ref 35.0–47.0)
HGB: 13.5 g/dL (ref 12.0–16.0)
LYMPHS ABS: 1.2 x10 3/mm (ref 1.0–3.6)
Lymphocyte %: 26.6 %
MCH: 23.6 pg — ABNORMAL LOW (ref 26.0–34.0)
MCHC: 30.9 g/dL — AB (ref 32.0–36.0)
MCV: 77 fL — ABNORMAL LOW (ref 80–100)
MONO ABS: 0.5 x10 3/mm (ref 0.2–0.9)
Monocyte %: 10.7 %
Neutrophil #: 2.6 x10 3/mm (ref 1.4–6.5)
Neutrophil %: 59.9 %
PLATELETS: 303 x10 3/mm (ref 150–440)
RBC: 5.71 10*6/uL — ABNORMAL HIGH (ref 3.80–5.20)
RDW: 21.5 % — ABNORMAL HIGH (ref 11.5–14.5)
WBC: 4.3 x10 3/mm (ref 3.6–11.0)

## 2014-06-07 ENCOUNTER — Ambulatory Visit: Payer: Self-pay | Admitting: Internal Medicine

## 2014-06-13 LAB — CBC CANCER CENTER
Basophil #: 0.1 x10 3/mm (ref 0.0–0.1)
Basophil %: 1.9 %
Eosinophil #: 0.1 x10 3/mm (ref 0.0–0.7)
Eosinophil %: 2.8 %
HCT: 41 % (ref 35.0–47.0)
HGB: 12.5 g/dL (ref 12.0–16.0)
Lymphocyte #: 1.4 x10 3/mm (ref 1.0–3.6)
Lymphocyte %: 32 %
MCH: 23.9 pg — AB (ref 26.0–34.0)
MCHC: 30.6 g/dL — ABNORMAL LOW (ref 32.0–36.0)
MCV: 78 fL — AB (ref 80–100)
MONO ABS: 0.5 x10 3/mm (ref 0.2–0.9)
Monocyte %: 12.3 %
Neutrophil #: 2.2 x10 3/mm (ref 1.4–6.5)
Neutrophil %: 51 %
PLATELETS: 348 x10 3/mm (ref 150–440)
RBC: 5.24 10*6/uL — ABNORMAL HIGH (ref 3.80–5.20)
RDW: 19.8 % — AB (ref 11.5–14.5)
WBC: 4.3 x10 3/mm (ref 3.6–11.0)

## 2014-07-08 ENCOUNTER — Ambulatory Visit: Payer: Self-pay | Admitting: Internal Medicine

## 2014-07-11 LAB — CBC CANCER CENTER
BASOS ABS: 0 x10 3/mm (ref 0.0–0.1)
Basophil %: 0.3 %
EOS PCT: 0.2 %
Eosinophil #: 0 x10 3/mm (ref 0.0–0.7)
HCT: 41.4 % (ref 35.0–47.0)
HGB: 12.9 g/dL (ref 12.0–16.0)
LYMPHS PCT: 9.8 %
Lymphocyte #: 0.8 x10 3/mm — ABNORMAL LOW (ref 1.0–3.6)
MCH: 23.7 pg — ABNORMAL LOW (ref 26.0–34.0)
MCHC: 31.2 g/dL — AB (ref 32.0–36.0)
MCV: 76 fL — ABNORMAL LOW (ref 80–100)
MONO ABS: 0.9 x10 3/mm (ref 0.2–0.9)
Monocyte %: 11.3 %
Neutrophil #: 6.6 x10 3/mm — ABNORMAL HIGH (ref 1.4–6.5)
Neutrophil %: 78.4 %
PLATELETS: 461 x10 3/mm — AB (ref 150–440)
RBC: 5.45 10*6/uL — ABNORMAL HIGH (ref 3.80–5.20)
RDW: 19 % — ABNORMAL HIGH (ref 11.5–14.5)
WBC: 8.4 x10 3/mm (ref 3.6–11.0)

## 2014-08-07 ENCOUNTER — Ambulatory Visit: Payer: Self-pay | Admitting: Internal Medicine

## 2014-08-08 LAB — CBC CANCER CENTER
Basophil #: 0.1 x10 3/mm (ref 0.0–0.1)
Basophil %: 1.1 %
EOS PCT: 1.4 %
Eosinophil #: 0.1 x10 3/mm (ref 0.0–0.7)
HCT: 42.6 % (ref 35.0–47.0)
HGB: 13.2 g/dL (ref 12.0–16.0)
Lymphocyte #: 1.8 x10 3/mm (ref 1.0–3.6)
Lymphocyte %: 33.9 %
MCH: 23.8 pg — AB (ref 26.0–34.0)
MCHC: 31 g/dL — ABNORMAL LOW (ref 32.0–36.0)
MCV: 77 fL — ABNORMAL LOW (ref 80–100)
MONO ABS: 0.8 x10 3/mm (ref 0.2–0.9)
Monocyte %: 15.2 %
NEUTROS PCT: 48.4 %
Neutrophil #: 2.6 x10 3/mm (ref 1.4–6.5)
Platelet: 536 x10 3/mm — ABNORMAL HIGH (ref 150–440)
RBC: 5.55 10*6/uL — ABNORMAL HIGH (ref 3.80–5.20)
RDW: 20.5 % — ABNORMAL HIGH (ref 11.5–14.5)
WBC: 5.4 x10 3/mm (ref 3.6–11.0)

## 2014-08-22 LAB — CBC CANCER CENTER
BASOS PCT: 1.2 %
Basophil #: 0.1 x10 3/mm (ref 0.0–0.1)
EOS PCT: 1.3 %
Eosinophil #: 0.1 x10 3/mm (ref 0.0–0.7)
HCT: 45 % (ref 35.0–47.0)
HGB: 13.9 g/dL (ref 12.0–16.0)
LYMPHS ABS: 1.6 x10 3/mm (ref 1.0–3.6)
Lymphocyte %: 31.8 %
MCH: 23.5 pg — ABNORMAL LOW (ref 26.0–34.0)
MCHC: 30.8 g/dL — ABNORMAL LOW (ref 32.0–36.0)
MCV: 76 fL — AB (ref 80–100)
MONOS PCT: 11.8 %
Monocyte #: 0.6 x10 3/mm (ref 0.2–0.9)
NEUTROS ABS: 2.8 x10 3/mm (ref 1.4–6.5)
NEUTROS PCT: 53.9 %
Platelet: 317 x10 3/mm (ref 150–440)
RBC: 5.9 10*6/uL — ABNORMAL HIGH (ref 3.80–5.20)
RDW: 20.2 % — ABNORMAL HIGH (ref 11.5–14.5)
WBC: 5.2 x10 3/mm (ref 3.6–11.0)

## 2014-09-05 LAB — CBC CANCER CENTER
Basophil #: 0.1 x10 3/mm (ref 0.0–0.1)
Basophil %: 1.5 %
EOS ABS: 0.1 x10 3/mm (ref 0.0–0.7)
Eosinophil %: 2.8 %
HCT: 40.7 % (ref 35.0–47.0)
HGB: 12.8 g/dL (ref 12.0–16.0)
Lymphocyte #: 1.7 x10 3/mm (ref 1.0–3.6)
Lymphocyte %: 35 %
MCH: 23.7 pg — ABNORMAL LOW (ref 26.0–34.0)
MCHC: 31.4 g/dL — ABNORMAL LOW (ref 32.0–36.0)
MCV: 76 fL — ABNORMAL LOW (ref 80–100)
MONO ABS: 0.6 x10 3/mm (ref 0.2–0.9)
Monocyte %: 12.6 %
Neutrophil #: 2.4 x10 3/mm (ref 1.4–6.5)
Neutrophil %: 48.1 %
Platelet: 440 x10 3/mm (ref 150–440)
RBC: 5.38 10*6/uL — AB (ref 3.80–5.20)
RDW: 19.7 % — AB (ref 11.5–14.5)
WBC: 4.9 x10 3/mm (ref 3.6–11.0)

## 2014-09-07 ENCOUNTER — Ambulatory Visit: Payer: Self-pay | Admitting: Internal Medicine

## 2014-09-19 LAB — CBC CANCER CENTER
BASOS ABS: 0.1 x10 3/mm (ref 0.0–0.1)
Basophil %: 2.3 %
Eosinophil #: 0.1 x10 3/mm (ref 0.0–0.7)
Eosinophil %: 3.1 %
HCT: 41.2 % (ref 35.0–47.0)
HGB: 12.5 g/dL (ref 12.0–16.0)
LYMPHS ABS: 1.4 x10 3/mm (ref 1.0–3.6)
Lymphocyte %: 34.1 %
MCH: 23.6 pg — AB (ref 26.0–34.0)
MCHC: 30.5 g/dL — AB (ref 32.0–36.0)
MCV: 77 fL — ABNORMAL LOW (ref 80–100)
MONO ABS: 0.5 x10 3/mm (ref 0.2–0.9)
Monocyte %: 12.5 %
NEUTROS ABS: 1.9 x10 3/mm (ref 1.4–6.5)
Neutrophil %: 48 %
Platelet: 230 x10 3/mm (ref 150–440)
RBC: 5.32 10*6/uL — ABNORMAL HIGH (ref 3.80–5.20)
RDW: 19.5 % — AB (ref 11.5–14.5)
WBC: 4 x10 3/mm (ref 3.6–11.0)

## 2014-10-03 DIAGNOSIS — R0600 Dyspnea, unspecified: Secondary | ICD-10-CM | POA: Insufficient documentation

## 2014-10-03 DIAGNOSIS — R079 Chest pain, unspecified: Secondary | ICD-10-CM | POA: Insufficient documentation

## 2014-10-08 ENCOUNTER — Ambulatory Visit: Payer: Self-pay | Admitting: Internal Medicine

## 2014-10-10 LAB — CBC CANCER CENTER
BASOS ABS: 0.2 x10 3/mm — AB (ref 0.0–0.1)
Basophil %: 3 %
Eosinophil #: 0.1 x10 3/mm (ref 0.0–0.7)
Eosinophil %: 2.2 %
HCT: 40.8 % (ref 35.0–47.0)
HGB: 12.7 g/dL (ref 12.0–16.0)
Lymphocyte #: 1.8 x10 3/mm (ref 1.0–3.6)
Lymphocyte %: 31.2 %
MCH: 23.5 pg — ABNORMAL LOW (ref 26.0–34.0)
MCHC: 31.1 g/dL — ABNORMAL LOW (ref 32.0–36.0)
MCV: 76 fL — ABNORMAL LOW (ref 80–100)
MONOS PCT: 12.1 %
Monocyte #: 0.7 x10 3/mm (ref 0.2–0.9)
NEUTROS PCT: 51.5 %
Neutrophil #: 2.9 x10 3/mm (ref 1.4–6.5)
PLATELETS: 317 x10 3/mm (ref 150–440)
RBC: 5.4 10*6/uL — ABNORMAL HIGH (ref 3.80–5.20)
RDW: 20.1 % — ABNORMAL HIGH (ref 11.5–14.5)
WBC: 5.6 x10 3/mm (ref 3.6–11.0)

## 2014-10-25 DIAGNOSIS — R0681 Apnea, not elsewhere classified: Secondary | ICD-10-CM | POA: Insufficient documentation

## 2014-10-25 DIAGNOSIS — R600 Localized edema: Secondary | ICD-10-CM | POA: Insufficient documentation

## 2014-10-25 DIAGNOSIS — I071 Rheumatic tricuspid insufficiency: Secondary | ICD-10-CM | POA: Insufficient documentation

## 2014-10-31 ENCOUNTER — Ambulatory Visit: Payer: Self-pay | Admitting: Internal Medicine

## 2014-11-06 ENCOUNTER — Ambulatory Visit
Admit: 2014-11-06 | Disposition: A | Discharge status: Home / Self Care | Payer: Self-pay | Attending: Internal Medicine | Admitting: Internal Medicine

## 2014-11-08 ENCOUNTER — Ambulatory Visit: Payer: Self-pay | Admitting: Family Medicine

## 2014-11-30 LAB — CBC CANCER CENTER
Basophil #: 0.1 x10 3/mm (ref 0.0–0.1)
Basophil %: 3 %
Eosinophil #: 0.1 x10 3/mm (ref 0.0–0.7)
Eosinophil %: 2.7 %
HCT: 39.4 % (ref 35.0–47.0)
HGB: 12.6 g/dL (ref 12.0–16.0)
Lymphocyte #: 1.7 x10 3/mm (ref 1.0–3.6)
Lymphocyte %: 33.5 %
MCH: 24.1 pg — AB (ref 26.0–34.0)
MCHC: 31.9 g/dL — ABNORMAL LOW (ref 32.0–36.0)
MCV: 75 fL — AB (ref 80–100)
MONO ABS: 0.6 x10 3/mm (ref 0.2–0.9)
MONOS PCT: 12.7 %
NEUTROS ABS: 2.4 x10 3/mm (ref 1.4–6.5)
Neutrophil %: 48.1 %
Platelet: 319 x10 3/mm (ref 150–440)
RBC: 5.23 10*6/uL — ABNORMAL HIGH (ref 3.80–5.20)
RDW: 19 % — ABNORMAL HIGH (ref 11.5–14.5)
WBC: 5 x10 3/mm (ref 3.6–11.0)

## 2014-12-07 ENCOUNTER — Ambulatory Visit
Admit: 2014-12-07 | Disposition: A | Discharge status: Home / Self Care | Payer: Self-pay | Attending: Internal Medicine | Admitting: Internal Medicine

## 2015-01-04 LAB — CBC CANCER CENTER
BASOS ABS: 0 x10 3/mm (ref 0.0–0.1)
Basophil %: 0.8 %
Eosinophil #: 0.1 x10 3/mm (ref 0.0–0.7)
Eosinophil %: 1.9 %
HCT: 41.3 % (ref 35.0–47.0)
HGB: 13.4 g/dL (ref 12.0–16.0)
LYMPHS PCT: 29.3 %
Lymphocyte #: 1.7 x10 3/mm (ref 1.0–3.6)
MCH: 25.7 pg — ABNORMAL LOW (ref 26.0–34.0)
MCHC: 32.4 g/dL (ref 32.0–36.0)
MCV: 79 fL — ABNORMAL LOW (ref 80–100)
Monocyte #: 0.6 x10 3/mm (ref 0.2–0.9)
Monocyte %: 10.4 %
NEUTROS PCT: 57.6 %
Neutrophil #: 3.3 x10 3/mm (ref 1.4–6.5)
Platelet: 395 x10 3/mm (ref 150–440)
RBC: 5.22 10*6/uL — ABNORMAL HIGH (ref 3.80–5.20)
RDW: 20.8 % — ABNORMAL HIGH (ref 11.5–14.5)
WBC: 5.7 x10 3/mm (ref 3.6–11.0)

## 2015-01-26 ENCOUNTER — Other Ambulatory Visit: Payer: Self-pay | Admitting: Internal Medicine

## 2015-02-01 ENCOUNTER — Inpatient Hospital Stay: Payer: Medicare Other | Attending: Family Medicine

## 2015-02-01 ENCOUNTER — Inpatient Hospital Stay: Payer: Medicare Other

## 2015-02-01 ENCOUNTER — Encounter (INDEPENDENT_AMBULATORY_CARE_PROVIDER_SITE_OTHER): Payer: Self-pay

## 2015-02-01 DIAGNOSIS — D45 Polycythemia vera: Secondary | ICD-10-CM | POA: Insufficient documentation

## 2015-02-01 DIAGNOSIS — C801 Malignant (primary) neoplasm, unspecified: Secondary | ICD-10-CM

## 2015-02-01 DIAGNOSIS — Z452 Encounter for adjustment and management of vascular access device: Secondary | ICD-10-CM | POA: Diagnosis not present

## 2015-02-01 LAB — CBC WITH DIFFERENTIAL/PLATELET
BASOS ABS: 0.1 10*3/uL (ref 0–0.1)
Basophils Relative: 1 %
Eosinophils Absolute: 0.1 10*3/uL (ref 0–0.7)
Eosinophils Relative: 1 %
HCT: 42.6 % (ref 35.0–47.0)
Hemoglobin: 13.7 g/dL (ref 12.0–16.0)
LYMPHS PCT: 29 %
Lymphs Abs: 1.4 10*3/uL (ref 1.0–3.6)
MCH: 26 pg (ref 26.0–34.0)
MCHC: 32.2 g/dL (ref 32.0–36.0)
MCV: 81 fL (ref 80.0–100.0)
MONO ABS: 0.6 10*3/uL (ref 0.2–0.9)
Monocytes Relative: 12 %
NEUTROS PCT: 57 %
Neutro Abs: 2.8 10*3/uL (ref 1.4–6.5)
PLATELETS: 295 10*3/uL (ref 150–440)
RBC: 5.25 MIL/uL — ABNORMAL HIGH (ref 3.80–5.20)
RDW: 20.3 % — ABNORMAL HIGH (ref 11.5–14.5)
WBC: 4.9 10*3/uL (ref 3.6–11.0)

## 2015-02-01 MED ORDER — HEPARIN SOD (PORK) LOCK FLUSH 100 UNIT/ML IV SOLN
500.0000 [IU] | Freq: Once | INTRAVENOUS | Status: AC
Start: 2015-02-01 — End: 2015-02-01
  Administered 2015-02-01: 500 [IU] via INTRAVENOUS

## 2015-02-01 MED ORDER — SODIUM CHLORIDE 0.9 % IJ SOLN
10.0000 mL | INTRAMUSCULAR | Status: DC | PRN
  Filled 2015-02-01: qty 10

## 2015-02-01 MED ORDER — HEPARIN SOD (PORK) LOCK FLUSH 100 UNIT/ML IV SOLN
INTRAVENOUS | Status: AC
Start: 2015-02-01 — End: 2015-02-01
  Filled 2015-02-01: qty 5

## 2015-02-15 ENCOUNTER — Inpatient Hospital Stay
Admission: EM | Admit: 2015-02-15 | Discharge: 2015-02-19 | DRG: 243 | Disposition: A | Discharge status: Home / Self Care | Payer: Medicare Other | Attending: Internal Medicine | Admitting: Internal Medicine

## 2015-02-15 ENCOUNTER — Emergency Department: Payer: Medicare Other

## 2015-02-15 ENCOUNTER — Observation Stay (HOSPITAL_COMMUNITY)
Admit: 2015-02-15 | Discharge: 2015-02-15 | Disposition: A | Discharge status: Home / Self Care | Payer: Medicare Other | Attending: Specialist | Admitting: Specialist

## 2015-02-15 DIAGNOSIS — Z8249 Family history of ischemic heart disease and other diseases of the circulatory system: Secondary | ICD-10-CM

## 2015-02-15 DIAGNOSIS — I10 Essential (primary) hypertension: Secondary | ICD-10-CM | POA: Diagnosis present

## 2015-02-15 DIAGNOSIS — E876 Hypokalemia: Secondary | ICD-10-CM | POA: Diagnosis present

## 2015-02-15 DIAGNOSIS — R55 Syncope and collapse: Secondary | ICD-10-CM | POA: Diagnosis present

## 2015-02-15 DIAGNOSIS — E785 Hyperlipidemia, unspecified: Secondary | ICD-10-CM | POA: Diagnosis present

## 2015-02-15 DIAGNOSIS — G8929 Other chronic pain: Secondary | ICD-10-CM | POA: Diagnosis present

## 2015-02-15 DIAGNOSIS — Z887 Allergy status to serum and vaccine status: Secondary | ICD-10-CM

## 2015-02-15 DIAGNOSIS — F17210 Nicotine dependence, cigarettes, uncomplicated: Secondary | ICD-10-CM | POA: Diagnosis present

## 2015-02-15 DIAGNOSIS — Z823 Family history of stroke: Secondary | ICD-10-CM

## 2015-02-15 DIAGNOSIS — Z888 Allergy status to other drugs, medicaments and biological substances status: Secondary | ICD-10-CM

## 2015-02-15 DIAGNOSIS — I495 Sick sinus syndrome: Principal | ICD-10-CM | POA: Diagnosis present

## 2015-02-15 DIAGNOSIS — W1830XA Fall on same level, unspecified, initial encounter: Secondary | ICD-10-CM | POA: Diagnosis present

## 2015-02-15 DIAGNOSIS — I455 Other specified heart block: Secondary | ICD-10-CM | POA: Diagnosis present

## 2015-02-15 DIAGNOSIS — I739 Peripheral vascular disease, unspecified: Secondary | ICD-10-CM | POA: Diagnosis present

## 2015-02-15 DIAGNOSIS — G4730 Sleep apnea, unspecified: Secondary | ICD-10-CM | POA: Diagnosis present

## 2015-02-15 DIAGNOSIS — Z880 Allergy status to penicillin: Secondary | ICD-10-CM

## 2015-02-15 DIAGNOSIS — Z950 Presence of cardiac pacemaker: Secondary | ICD-10-CM

## 2015-02-15 DIAGNOSIS — Z7902 Long term (current) use of antithrombotics/antiplatelets: Secondary | ICD-10-CM

## 2015-02-15 DIAGNOSIS — R579 Shock, unspecified: Secondary | ICD-10-CM | POA: Diagnosis present

## 2015-02-15 DIAGNOSIS — Z8673 Personal history of transient ischemic attack (TIA), and cerebral infarction without residual deficits: Secondary | ICD-10-CM

## 2015-02-15 DIAGNOSIS — Z79899 Other long term (current) drug therapy: Secondary | ICD-10-CM

## 2015-02-15 DIAGNOSIS — D751 Secondary polycythemia: Secondary | ICD-10-CM | POA: Diagnosis present

## 2015-02-15 DIAGNOSIS — W19XXXA Unspecified fall, initial encounter: Secondary | ICD-10-CM

## 2015-02-15 DIAGNOSIS — J449 Chronic obstructive pulmonary disease, unspecified: Secondary | ICD-10-CM | POA: Diagnosis present

## 2015-02-15 DIAGNOSIS — S2231XA Fracture of one rib, right side, initial encounter for closed fracture: Secondary | ICD-10-CM | POA: Diagnosis present

## 2015-02-15 DIAGNOSIS — Z7982 Long term (current) use of aspirin: Secondary | ICD-10-CM

## 2015-02-15 DIAGNOSIS — I639 Cerebral infarction, unspecified: Secondary | ICD-10-CM

## 2015-02-15 DIAGNOSIS — I6523 Occlusion and stenosis of bilateral carotid arteries: Secondary | ICD-10-CM | POA: Diagnosis present

## 2015-02-15 HISTORY — DX: Hyperlipidemia, unspecified: E78.5

## 2015-02-15 HISTORY — DX: Other chronic pain: G89.29

## 2015-02-15 HISTORY — DX: Transient cerebral ischemic attack, unspecified: G45.9

## 2015-02-15 HISTORY — DX: Peripheral vascular disease, unspecified: I73.9

## 2015-02-15 HISTORY — DX: Dorsalgia, unspecified: M54.9

## 2015-02-15 HISTORY — DX: Presence of other vascular implants and grafts: Z95.828

## 2015-02-15 HISTORY — DX: Chronic obstructive pulmonary disease, unspecified: J44.9

## 2015-02-15 HISTORY — DX: Secondary polycythemia: D75.1

## 2015-02-15 LAB — CBC
HCT: 46.7 % (ref 35.0–47.0)
Hemoglobin: 14.8 g/dL (ref 12.0–16.0)
MCH: 26.6 pg (ref 26.0–34.0)
MCHC: 31.7 g/dL — ABNORMAL LOW (ref 32.0–36.0)
MCV: 83.8 fL (ref 80.0–100.0)
Platelets: 396 10*3/uL (ref 150–440)
RBC: 5.57 MIL/uL — ABNORMAL HIGH (ref 3.80–5.20)
RDW: 20.8 % — ABNORMAL HIGH (ref 11.5–14.5)
WBC: 6.2 10*3/uL (ref 3.6–11.0)

## 2015-02-15 LAB — BASIC METABOLIC PANEL
Anion gap: 10 (ref 5–15)
BUN: 10 mg/dL (ref 6–20)
CO2: 30 mmol/L (ref 22–32)
Calcium: 9.1 mg/dL (ref 8.9–10.3)
Chloride: 99 mmol/L — ABNORMAL LOW (ref 101–111)
Creatinine, Ser: 1.11 mg/dL — ABNORMAL HIGH (ref 0.44–1.00)
GFR calc Af Amer: 50 mL/min — ABNORMAL LOW (ref >60)
GFR calc non Af Amer: 43 mL/min — ABNORMAL LOW (ref >60)
Glucose, Bld: 100 mg/dL — ABNORMAL HIGH (ref 65–99)
Potassium: 3.1 mmol/L — ABNORMAL LOW (ref 3.5–5.1)
SODIUM: 139 mmol/L (ref 135–145)

## 2015-02-15 LAB — TROPONIN I: Troponin I: <0.03 ng/mL (ref <0.031)

## 2015-02-15 MED ORDER — ACETAMINOPHEN 650 MG RE SUPP: 650.0000 mg | Freq: Four times a day (QID) | RECTAL | Status: DC | PRN

## 2015-02-15 MED ORDER — TRAMADOL HCL 50 MG PO TABS
50.0000 mg | ORAL_TABLET | Freq: Every evening | ORAL | Status: DC
  Administered 2015-02-15 – 2015-02-18 (×4): 50 mg via ORAL
  Filled 2015-02-15 (×4): qty 1

## 2015-02-15 MED ORDER — ATORVASTATIN CALCIUM 10 MG PO TABS
10.0000 mg | ORAL_TABLET | Freq: Every day | ORAL | Status: DC
  Administered 2015-02-16 – 2015-02-19 (×4): 10 mg via ORAL
  Filled 2015-02-15 (×4): qty 1

## 2015-02-15 MED ORDER — HYDROCODONE-ACETAMINOPHEN 5-325 MG PO TABS: 1.0000 | ORAL_TABLET | ORAL | Status: DC | PRN

## 2015-02-15 MED ORDER — ONDANSETRON HCL 4 MG PO TABS: 4.0000 mg | ORAL_TABLET | Freq: Four times a day (QID) | ORAL | Status: DC | PRN

## 2015-02-15 MED ORDER — POTASSIUM CHLORIDE ER 10 MEQ PO TBCR
10.0000 meq | EXTENDED_RELEASE_TABLET | Freq: Every day | ORAL | Status: DC
  Administered 2015-02-15 – 2015-02-19 (×4): 10 meq via ORAL
  Filled 2015-02-15 (×9): qty 1

## 2015-02-15 MED ORDER — ENOXAPARIN SODIUM 40 MG/0.4ML ~~LOC~~ SOLN
40.0000 mg | SUBCUTANEOUS | Status: DC
  Administered 2015-02-15 – 2015-02-16 (×2): 40 mg via SUBCUTANEOUS
  Filled 2015-02-15 (×2): qty 0.4

## 2015-02-15 MED ORDER — CLOPIDOGREL BISULFATE 75 MG PO TABS: 75.0000 mg | ORAL_TABLET | Freq: Every day | ORAL | Status: DC

## 2015-02-15 MED ORDER — ONDANSETRON HCL 4 MG/2ML IJ SOLN: 4.0000 mg | Freq: Four times a day (QID) | INTRAMUSCULAR | Status: DC | PRN

## 2015-02-15 MED ORDER — FUROSEMIDE 20 MG PO TABS: 20.0000 mg | ORAL_TABLET | Freq: Every day | ORAL | Status: DC

## 2015-02-15 MED ORDER — HYDROXYUREA 500 MG PO CAPS
500.0000 mg | ORAL_CAPSULE | ORAL | Status: DC
  Administered 2015-02-18: 500 mg via ORAL
  Filled 2015-02-15 (×3): qty 1

## 2015-02-15 MED ORDER — SODIUM CHLORIDE 0.9 % IJ SOLN: 3.0000 mL | Freq: Two times a day (BID) | INTRAMUSCULAR | Status: DC

## 2015-02-15 MED ORDER — ACETAMINOPHEN 325 MG PO TABS: 650.0000 mg | ORAL_TABLET | Freq: Four times a day (QID) | ORAL | Status: DC | PRN

## 2015-02-15 MED ORDER — SODIUM CHLORIDE 0.9 % IJ SOLN: 3.0000 mL | INTRAMUSCULAR | Status: DC | PRN

## 2015-02-15 NOTE — ED Notes (Signed)
Pt back from xray at this time.

## 2015-02-15 NOTE — Progress Notes (Signed)
*  PRELIMINARY RESULTS* Echocardiogram 2D Echocardiogram has been performed.  Krista Ortiz 02/15/2015, 5:50 PM

## 2015-02-15 NOTE — H&P (Signed)
[place] [facility] [place] NAME: Krista Ortiz    MR#:  [id number]  DATE OF BIRTH:  [birth year]  DATE OF ADMISSION:  02/15/2015  PRIMARY CARE PHYSICIAN: Hortencia Pilar, MD   REQUESTING/REFERRING PHYSICIAN: Dr. Lenise Arena  CHIEF COMPLAINT:   Chief Complaint  Patient presents with  . Loss of Consciousness  . Back Pain  . Laceration   Syncope with collapse  HISTORY OF PRESENT ILLNESS:  Krista Ortiz  is a 81 y.o. female with a known history of chronic back pain, hyperlipidemia, polycythemia, status post Port-A-Cath placement, history of previous TIA, peripheral vascular disease, who presents to the hospital after a syncopal episode. Patient says that she woke up around 11 PM last night and went to get something out of her fridge she then turned around and was walking towards her couch in the next thing she remembers is waking up on the floor. Patient cannot recall how much time had elapsed from when she finally gaining consciousness. Patient denies any prodromal symptoms of palpitations, chest pain, diaphoresis, nausea vomiting or any other associated symptoms prior to her fall. She was not incontinent, and she has no history of seizure type activity in the past. Patient presented to emergency room and is now being admitted for further evaluation of her syncope.  PAST MEDICAL HISTORY:   Past Medical History  Diagnosis Date  . Chronic back pain   . Hyperlipidemia   . Polycythemia   . COPD (chronic obstructive pulmonary disease)   . TIA (transient ischemic attack)   . PVD (peripheral vascular disease)   . Port-a-cath in place     PAST SURGICAL HISTORY:   Past Surgical History  Procedure Laterality Date  . Back surgery    . Abdominal hysterectomy    . Tonsillectomy      SOCIAL HISTORY:   History  Substance Use Topics  . Smoking status: Current Every Day Smoker -- 0.50 packs/day    Types: Cigarettes  . Smokeless  tobacco: Never Used  . Alcohol Use: No    FAMILY HISTORY:   Family History  Problem Relation Age of Onset  . CVA Sister   . Heart attack Brother     DRUG ALLERGIES:   Allergies  Allergen Reactions  . Citalopram Other (See Comments)    Reaction:  Fatigue   . Pneumovax [Pneumococcal Polysaccharide Vaccine] Other (See Comments)    Reaction:  Unknown   . Requip [Ropinirole Hcl] Other (See Comments)    Reaction:  Mouth dryness   . Penicillins Rash    REVIEW OF SYSTEMS:   Review of Systems  Constitutional: Negative for fever and weight loss.  HENT: Negative for congestion, nosebleeds and tinnitus.   Eyes: Negative for blurred vision, double vision and redness.  Respiratory: Negative for cough, hemoptysis and shortness of breath.   Cardiovascular: Negative for chest pain, orthopnea, leg swelling and PND.       + syncope.   Gastrointestinal: Negative for nausea, vomiting, abdominal pain, diarrhea and melena.  Genitourinary: Negative for dysuria, urgency and hematuria.  Musculoskeletal: Negative for joint pain and falls.  Skin: Negative for rash.  Neurological: Negative for dizziness, tingling, sensory change, focal weakness, seizures, weakness and headaches.  Endo/Heme/Allergies: Negative for polydipsia. Does not bruise/bleed easily.  Psychiatric/Behavioral: Negative for depression and memory loss. The patient is not nervous/anxious.     MEDICATIONS AT HOME:   Prior to Admission medications   Medication Sig Start Date End Date Taking? Authorizing  Provider  atorvastatin (LIPITOR) 10 MG tablet Take 10 mg by mouth daily.   Yes Historical Provider, MD  clopidogrel (PLAVIX) 75 MG tablet Take 75 mg by mouth daily.   Yes Historical Provider, MD  furosemide (LASIX) 20 MG tablet Take 20 mg by mouth daily.   Yes Historical Provider, MD  hydroxyurea (HYDREA) 500 MG capsule Take 500 mg by mouth 3 (three) times a week. Pt takes on Monday, Wednesday, and Friday.   Yes Historical Provider,  MD  potassium chloride (K-DUR) 10 MEQ tablet Take 10 mEq by mouth daily.   Yes Historical Provider, MD  traMADol (ULTRAM) 50 MG tablet Take 50 mg by mouth every evening.   Yes Historical Provider, MD      VITAL SIGNS:  Blood pressure 144/54, pulse 75, temperature 98.1 F (36.7 C), temperature source Oral, resp. rate 18, height 5\' 5"  (1.651 m), weight 70.761 kg (156 lb), SpO2 97 %.  PHYSICAL EXAMINATION:  Physical Exam  GENERAL:  81 y.o.-year-old patient lying in the bed with no acute distress.  EYES: Pupils equal, round, reactive to light and accommodation. No scleral icterus. Extraocular muscles intact.  HEENT: Head atraumatic, normocephalic. Oropharynx and nasopharynx clear. No oropharyngeal erythema, moist oral mucosa  NECK:  Supple, no jugular venous distention. No thyroid enlargement, no tenderness.  LUNGS: Normal breath sounds bilaterally, no wheezing, rales, rhonchi. No use of accessory muscles of respiration.  CARDIOVASCULAR: S1, S2 normal. II/VI SEM at RSB0, no rubs, gallops or clicks.  ABDOMEN: Soft, nontender, nondistended. Bowel sounds present. No organomegaly or mass.  EXTREMITIES: No pedal edema, cyanosis, or clubbing. + 2 pedal & radial pulses b/l.   NEUROLOGIC: Cranial nerves II through XII are intact. No focal Motor or sensory deficits appreciated b/l PSYCHIATRIC: The patient is alert and oriented x 3. Good affect.  SKIN: No obvious rash, lesion, or ulcer.   LABORATORY PANEL:   CBC  Recent Labs Lab 02/15/15 1354  WBC 6.2  HGB 14.8  HCT 46.7  PLT 396   ------------------------------------------------------------------------------------------------------------------  Chemistries   Recent Labs Lab 02/15/15 1354  NA 139  K 3.1*  CL 99*  CO2 30  GLUCOSE 100*  BUN 10  CREATININE 1.11*  CALCIUM 9.1   ------------------------------------------------------------------------------------------------------------------  Cardiac Enzymes  Recent Labs Lab  02/15/15 1354  TROPONINI <0.03   ------------------------------------------------------------------------------------------------------------------  RADIOLOGY:  Dg Ribs Unilateral W/chest Right  02/15/2015   CLINICAL DATA:  [place] last night walking with a walker. Right posterior chest wall pain.  EXAM: RIGHT RIBS AND CHEST - 3+ VIEW  COMPARISON:  07/04/2007  FINDINGS: Heart size is normal. There is calcification of the aorta. Port-A-Cath on the right has its tip in the SVC 2 cm above the right atrium. The lungs are clear. No pneumothorax or hemothorax. The bones are osteopenic, but I do not see a definite rib fracture. One could question a nondisplaced fracture of the right fifth rib posteriorly, but this is not definite. On the oblique view, there appears to be a partial compression fracture of T6, age indeterminate.  IMPRESSION: No active cardiopulmonary disease. Question nondisplaced fracture of the right posterior fifth rib. Partial compression fracture of T6, age indeterminate.   Electronically Signed   By: Nelson Chimes M.D.   On: 02/15/2015 15:03   Dg Lumbar Spine Complete  02/15/2015   CLINICAL DATA:  Loss of consciousness with back pain and laceration  EXAM: LUMBAR SPINE - COMPLETE 4+ VIEW  COMPARISON:  None currently available  FINDINGS: Posterior fixation from L3-L5  with rod and pedicle screws. Bone cement present in the L4 body which has indistinct cortical margins. There is no evidence of hardware or bony fracture. No subluxation. Degenerative disc change which is greatest adjacent to the fusion at L2-3, with disc narrowing and vacuum phenomenon.  Diffuse atherosclerosis.  Osteopenia.  IMPRESSION: 1. No acute findings. 2. L4 body bone cement and L3-L5 posterior fixation.   Electronically Signed   By: Monte Fantasia M.D.   On: 02/15/2015 13:10   Ct Head Wo Contrast  02/15/2015   CLINICAL DATA:  Loss of consciousness with back pain and laceration.  EXAM: CT HEAD WITHOUT CONTRAST  TECHNIQUE:  Contiguous axial images were obtained from the base of the skull through the vertex without intravenous contrast.  COMPARISON:  Brain MRI 02/16/2007  FINDINGS: Skull and Sinuses:Negative for fracture or destructive process. The mastoids, middle ears, and imaged paranasal sinuses are clear.  Orbits: Bilateral cataract resection.  Brain: No evidence of acute infarct, hemorrhage, hydrocephalus, or shift. 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 Stable 17 mm left CP angle mass, likely a meningioma based on previous imaging.  IMPRESSION: 1. No acute findings. 2. 17 mm left CP angle mass is stable over multiple years, likely a meningioma. 3. Age related volume loss and white matter disease.   Electronically Signed   By: Monte Fantasia M.D.   On: 02/15/2015 13:18   Dg Hip Unilat With Pelvis 2-3 Views Right  02/15/2015   CLINICAL DATA:  Status post fall last night with walker.  EXAM: RIGHT HIP (WITH PELVIS) 2-3 VIEWS  COMPARISON:  None.  FINDINGS: There is generalized osteopenia. There is no hip fracture or dislocation. There is no lytic or sclerotic osseous lesion. There are degenerative changes of the pubic symphysis.  There are mild degenerative changes of bilateral SI joints. There is posterior lumbar interbody fusion of the lower lumbar spine.  IMPRESSION: No acute osseous injury of the right hip. Given the patient's age and osteopenia, if there is persistent clinical concern for an occult hip fracture, a MRI of the hip is recommended for increased sensitivity.   Electronically Signed   By: Kathreen Devoid   On: 02/15/2015 15:01     IMPRESSION AND PLAN:   81 year old female with past medical history of polycythemia, hyperlipidemia, peripheral asked disease, COPD with ongoing tobacco abuse, who presented to the hospital with a syncopal episode.  #1 syncope with  collapse-the exact etiology of this is unclear presently. -We'll observe him on telemetry and watch for any arrhythmias. We'll cycle her cardiac markers 3. -We'll check carotid duplex, and  two-dimensional echocardiogram -Patient's CT head on admission is negative for any acute intracranial pathology.  #2 status post fall-this is a result of the syncope. -We'll get a physical therapy consult.  #3 hypokalemia-we'll continue potassium supplementation and repeat level in the morning.  #4 history of previous TIA-continue Plavix continue statin.  #5 chronic back pain-continue tramadol.   #6 right rib fracture-continue incentive spirometry and pain control with tramadol.    All the records are reviewed and case discussed with ED provider. Management plans discussed with the patient, family and they are in agreement.  CODE STATUS: Full  TOTAL TIME TAKING CARE OF THIS PATIENT: 45 minutes.    Henreitta Leber M.D on 02/15/2015 at 3:49 PM  Between 7am to 6pm - Pager - [phone]  After  6pm go to [email or web address] - password [email or web address] [facility]  Office  [phone]  CC: Primary care physician; Hortencia Pilar, MD

## 2015-02-15 NOTE — ED Notes (Signed)
Pt states she was up in the kitchen last night with her walker around 11pm states she went to turn and passed out, states she woke up on the floor and just went back to bed. Today states she is having pain from lower back all the way up to the upper back with a hx of back surgery in the past, pt also has a C-shaped skin tear to the left FA..with controlled bleeding.the patient is a/ox4, VSS

## 2015-02-15 NOTE — ED Provider Notes (Signed)
[facility] Emergency Department Provider Note     Time seen: ----------------------------------------- 2:10 PM on 02/15/2015 -----------------------------------------    I have reviewed the triage vital signs and the nursing notes.   HISTORY  Chief Complaint Loss of Consciousness; Back Pain; and Laceration    HPI Krista Ortiz is a 81 y.o. female who presents ER after simple episode last night. Patient states she was up in the kitchen using her walker around p.m. she states she went to turn and passed out and woke up on the floor. She will complaining of right side pain since that period time. She states she just got up went back to bed. Leg of moderate right chest wall and right hip pain. Nothing makes it better or worse. Is not having history of syncope or frequent falls. Denies any recent illness or medication changes.   Past Medical History  Diagnosis Date  . Chronic back pain   . Hyperlipidemia     There are no active problems to display for this patient.   Past Surgical History  Procedure Laterality Date  . Back surgery    . Abdominal hysterectomy    . Tonsillectomy      Allergies Penicillins  Social History History  Substance Use Topics  . Smoking status: Current Every Day Smoker -- 0.50 packs/day    Types: Cigarettes  . Smokeless tobacco: Never Used  . Alcohol Use: No    Review of Systems Constitutional: Negative for fever. Eyes: Negative for visual changes. ENT: Negative for sore throat. Cardiovascular: Negative for chest pain. Positive for syncope Respiratory: Negative for shortness of breath. Gastrointestinal: Negative for abdominal pain, vomiting and diarrhea. Genitourinary: Negative for dysuria. Musculoskeletal: Right chest wall pain, right hip pain Skin: Negative for rash. Neurological: Negative for headaches, focal weakness or numbness.  10-point ROS otherwise  negative.  ____________________________________________   PHYSICAL EXAM:  VITAL SIGNS: ED Triage Vitals  Enc Vitals Group     BP 02/15/15 1205 141/55 mmHg     Pulse Rate 02/15/15 1205 84     Resp 02/15/15 1205 18     Temp 02/15/15 1205 98.1 F (36.7 C)     Temp Source 02/15/15 1205 Oral     SpO2 02/15/15 1205 94 %     Weight 02/15/15 1205 156 lb (70.761 kg)     Height 02/15/15 1205 5\' 5"  (1.651 m)     Head Cir --      Peak Flow --      Pain Score 02/15/15 1206 5     Pain Loc --      Pain Edu? --      Excl. in [place]? --     Constitutional: Alert and oriented. Well appearing and in no distress. Eyes: Conjunctivae are normal. PERRL. Normal extraocular movements. ENT   Head: Normocephalic and atraumatic.   Nose: No congestion/rhinnorhea.   Mouth/Throat: Mucous membranes are moist.   Neck: No stridor. Hematological/Lymphatic/Immunilogical: No cervical lymphadenopathy. Cardiovascular: Normal rate, regular rhythm. Normal and symmetric distal pulses are present in all extremities. No murmurs, rubs, or gallops. Respiratory: Normal respiratory effort without tachypnea nor retractions. Breath sounds are clear and equal bilaterally. No wheezes/rales/rhonchi. Gastrointestinal: Soft and nontender. No distention. No abdominal bruits. There is no CVA tenderness. Musculoskeletal: Right chest wall and inferior axillary rib tenderness on the right, right hip tenderness and iliac crest tenderness. Neurologic:  Normal speech and language. No gross focal neurologic deficits are appreciated. Speech is normal. No gait instability. Skin:  Skin is warm, dry and intact. No rash noted. Psychiatric: Mood and affect are normal. Speech and behavior are normal. Patient exhibits appropriate insight and judgment. ____________________________________________  EKG: Interpreted by me. Sinus rhythm with rate 83, normal axis normal intervals, no evidence of hypertrophy or acute  infarction.  ____________________________________________  ED COURSE:  Pertinent labs & imaging results that were available during my care of the patient were reviewed by me and considered in my medical decision making (see chart for details). Patient with true syncope, will likely need hospitalization and telemetry. No clear etiology for syncope. ____________________________________________    LABS (pertinent positives/negatives)  Labs Reviewed  CBC - Abnormal; Notable for the following:    RBC 5.57 (*)    MCHC 31.7 (*)    RDW 20.8 (*)    All other components within normal limits  BASIC METABOLIC PANEL - Abnormal; Notable for the following:    Potassium 3.1 (*)    Chloride 99 (*)    Glucose, Bld 100 (*)    Creatinine, Ser 1.11 (*)    GFR calc non Af Amer 43 (*)    GFR calc Af Amer 50 (*)    All other components within normal limits  TROPONIN I    RADIOLOGY  Rib x-rays, right hip x-rays IMPRESSION: No active cardiopulmonary disease. Question nondisplaced fracture of the right posterior fifth rib. Partial compression fracture of T6, age indeterminate.  IMPRESSION: 1. No acute findings. 2. 17 mm left CP angle mass is stable over multiple years, likely a meningioma. 3. Age related volume loss and white matter disease.   IMPRESSION: No acute osseous injury of the right hip. Given the patient's age and osteopenia, if there is persistent clinical concern for an occult hip fracture, a MRI of the hip is recommended for increased sensitivity. ____________________________________________  FINAL ASSESSMENT AND PLAN  Syncope, contusion, rib fracture  Plan: Patient with true syncopal episode, will need admission to hospital on telemetry, likely echo and carotid Dopplers. Patient is agreeable to admission, no acute distress this time vital signs stable currently.   Earleen Newport, MD   Earleen Newport, MD 02/15/15 [phone]

## 2015-02-16 ENCOUNTER — Observation Stay: Payer: Medicare Other

## 2015-02-16 DIAGNOSIS — Z7982 Long term (current) use of aspirin: Secondary | ICD-10-CM | POA: Diagnosis not present

## 2015-02-16 DIAGNOSIS — E785 Hyperlipidemia, unspecified: Secondary | ICD-10-CM | POA: Diagnosis present

## 2015-02-16 DIAGNOSIS — W1830XA Fall on same level, unspecified, initial encounter: Secondary | ICD-10-CM | POA: Diagnosis present

## 2015-02-16 DIAGNOSIS — D751 Secondary polycythemia: Secondary | ICD-10-CM | POA: Diagnosis present

## 2015-02-16 DIAGNOSIS — J449 Chronic obstructive pulmonary disease, unspecified: Secondary | ICD-10-CM | POA: Diagnosis present

## 2015-02-16 DIAGNOSIS — G8929 Other chronic pain: Secondary | ICD-10-CM | POA: Diagnosis present

## 2015-02-16 DIAGNOSIS — Z8673 Personal history of transient ischemic attack (TIA), and cerebral infarction without residual deficits: Secondary | ICD-10-CM | POA: Diagnosis not present

## 2015-02-16 DIAGNOSIS — I6523 Occlusion and stenosis of bilateral carotid arteries: Secondary | ICD-10-CM | POA: Diagnosis present

## 2015-02-16 DIAGNOSIS — I739 Peripheral vascular disease, unspecified: Secondary | ICD-10-CM | POA: Diagnosis present

## 2015-02-16 DIAGNOSIS — G4730 Sleep apnea, unspecified: Secondary | ICD-10-CM | POA: Diagnosis present

## 2015-02-16 DIAGNOSIS — Z887 Allergy status to serum and vaccine status: Secondary | ICD-10-CM | POA: Diagnosis not present

## 2015-02-16 DIAGNOSIS — I10 Essential (primary) hypertension: Secondary | ICD-10-CM | POA: Diagnosis present

## 2015-02-16 DIAGNOSIS — Z880 Allergy status to penicillin: Secondary | ICD-10-CM | POA: Diagnosis not present

## 2015-02-16 DIAGNOSIS — I455 Other specified heart block: Secondary | ICD-10-CM | POA: Diagnosis present

## 2015-02-16 DIAGNOSIS — Z79899 Other long term (current) drug therapy: Secondary | ICD-10-CM | POA: Diagnosis not present

## 2015-02-16 DIAGNOSIS — Z888 Allergy status to other drugs, medicaments and biological substances status: Secondary | ICD-10-CM | POA: Diagnosis not present

## 2015-02-16 DIAGNOSIS — Z823 Family history of stroke: Secondary | ICD-10-CM | POA: Diagnosis not present

## 2015-02-16 DIAGNOSIS — I495 Sick sinus syndrome: Secondary | ICD-10-CM | POA: Diagnosis present

## 2015-02-16 DIAGNOSIS — S2231XA Fracture of one rib, right side, initial encounter for closed fracture: Secondary | ICD-10-CM | POA: Diagnosis present

## 2015-02-16 DIAGNOSIS — Z8249 Family history of ischemic heart disease and other diseases of the circulatory system: Secondary | ICD-10-CM | POA: Diagnosis not present

## 2015-02-16 DIAGNOSIS — Z7902 Long term (current) use of antithrombotics/antiplatelets: Secondary | ICD-10-CM | POA: Diagnosis not present

## 2015-02-16 DIAGNOSIS — R55 Syncope and collapse: Secondary | ICD-10-CM | POA: Diagnosis present

## 2015-02-16 DIAGNOSIS — E876 Hypokalemia: Secondary | ICD-10-CM | POA: Diagnosis present

## 2015-02-16 DIAGNOSIS — F17210 Nicotine dependence, cigarettes, uncomplicated: Secondary | ICD-10-CM | POA: Diagnosis present

## 2015-02-16 DIAGNOSIS — R579 Shock, unspecified: Secondary | ICD-10-CM | POA: Diagnosis present

## 2015-02-16 LAB — BASIC METABOLIC PANEL
Anion gap: 9 (ref 5–15)
BUN: 8 mg/dL (ref 6–20)
CHLORIDE: 103 mmol/L (ref 101–111)
CO2: 30 mmol/L (ref 22–32)
CREATININE: 0.96 mg/dL (ref 0.44–1.00)
Calcium: 8.5 mg/dL — ABNORMAL LOW (ref 8.9–10.3)
GFR calc Af Amer: 59 mL/min — ABNORMAL LOW (ref >60)
GFR, EST NON AFRICAN AMERICAN: 51 mL/min — AB (ref >60)
Glucose, Bld: 97 mg/dL (ref 65–99)
Potassium: 3.1 mmol/L — ABNORMAL LOW (ref 3.5–5.1)
Sodium: 142 mmol/L (ref 135–145)

## 2015-02-16 LAB — TROPONIN I: Troponin I: <0.03 ng/mL (ref <0.031)

## 2015-02-16 LAB — MAGNESIUM: Magnesium: 1.4 mg/dL — ABNORMAL LOW (ref 1.7–2.4)

## 2015-02-16 MED ORDER — ASPIRIN EC 81 MG PO TBEC
81.0000 mg | DELAYED_RELEASE_TABLET | Freq: Every day | ORAL | Status: DC
  Administered 2015-02-16 – 2015-02-19 (×4): 81 mg via ORAL
  Filled 2015-02-16 (×4): qty 1

## 2015-02-16 MED ORDER — MAGNESIUM SULFATE 2 GM/50ML IV SOLN
2.0000 g | Freq: Once | INTRAVENOUS | Status: AC
  Administered 2015-02-16: 2 g via INTRAVENOUS
  Filled 2015-02-16: qty 50

## 2015-02-16 MED ORDER — POTASSIUM CHLORIDE CRYS ER 20 MEQ PO TBCR
40.0000 meq | EXTENDED_RELEASE_TABLET | Freq: Once | ORAL | Status: AC
  Administered 2015-02-16: 40 meq via ORAL
  Filled 2015-02-16: qty 2

## 2015-02-16 NOTE — Consult Note (Signed)
Reason for Consult: syncope, sinus arrest Referring Physician:  Dr. Earleen Newport  hospitalist Primary cardiologist Dr. Nehemiah Massed Oncology Dr. Patrecia Pace Shirah Krista Ortiz is an 81 y.o. female.  HPI:  81 year old white female history of what sounds like polycythemia getting recurrent phlebotomy treatments has a port in her right chest reportedly was doing reasonably well and at home had a syncopal episode she had no warning she was using a walker and a nursing she knew she woke up on the floor she was brought into the hospital admitted placed on telemetry she had what appears to be at 10 second pause and multiple 3 second pauses. Patient had a coughing episode at the time so did not have syncope and again had no warning no significant other symptoms. Patient denies any chest pain symptoms no leg edema still has some weakness and fatigue. Patient appears has polycythemia still has significant chronic back pain.  Past Medical History  Diagnosis Date  . Chronic back pain   . Hyperlipidemia   . Polycythemia   . COPD (chronic obstructive pulmonary disease)   . TIA (transient ischemic attack)   . PVD (peripheral vascular disease)   . Port-a-cath in place     Past Surgical History  Procedure Laterality Date  . Back surgery    . Abdominal hysterectomy    . Tonsillectomy      Family History  Problem Relation Age of Onset  . CVA Sister   . Heart attack Brother   . Other Mother     Old age  . Other Father     Old age    Social History:  reports that she has been smoking Cigarettes.  She has been smoking about 0.50 packs per day. She has never used smokeless tobacco. She reports that she does not drink alcohol or use illicit drugs.  Allergies:  Allergies  Allergen Reactions  . Citalopram Other (See Comments)    Reaction:  Fatigue   . Pneumovax [Pneumococcal Polysaccharide Vaccine] Other (See Comments)    Reaction:  Unknown   . Requip [Ropinirole Hcl] Other (See Comments)    Reaction:  Dry  mouth   . Penicillins Rash    Medications:  Prior to Admission:  Prescriptions prior to admission  Medication Sig Dispense Refill Last Dose  . atorvastatin (LIPITOR) 10 MG tablet Take 10 mg by mouth daily.   02/14/2015 at Unknown time  . clopidogrel (PLAVIX) 75 MG tablet Take 75 mg by mouth daily.   02/15/2015 at Unknown time  . furosemide (LASIX) 20 MG tablet Take 20 mg by mouth daily.   02/15/2015 at Unknown time  . hydroxyurea (HYDREA) 500 MG capsule Take 500 mg by mouth 3 (three) times a week. Pt takes on Monday, Wednesday, and Friday.   02/15/2015 at Unknown time  . potassium chloride (K-DUR) 10 MEQ tablet Take 10 mEq by mouth daily.   02/15/2015 at Unknown time  . traMADol (ULTRAM) 50 MG tablet Take 50 mg by mouth every evening.   02/14/2015 at [place]    Results for orders placed or performed during the hospital encounter of 02/15/15 (from the past 48 hour(s))  CBC     Status: Abnormal   Collection Time: 02/15/15  1:54 PM  Result Value Ref Range   WBC 6.2 3.6 - 11.0 K/uL   RBC 5.57 (H) 3.80 - 5.20 MIL/uL   Hemoglobin 14.8 12.0 - 16.0 g/dL   HCT 46.7 35.0 - 47.0 %   MCV 83.8 80.0 - 100.0 fL  MCH 26.6 26.0 - 34.0 pg   MCHC 31.7 (L) 32.0 - 36.0 g/dL   RDW 20.8 (H) 11.5 - 14.5 %   Platelets 396 150 - 440 K/uL  Basic metabolic panel     Status: Abnormal   Collection Time: 02/15/15  1:54 PM  Result Value Ref Range   Sodium 139 135 - 145 mmol/L   Potassium 3.1 (L) 3.5 - 5.1 mmol/L   Chloride 99 (L) 101 - 111 mmol/L   CO2 30 22 - 32 mmol/L   Glucose, Bld 100 (H) 65 - 99 mg/dL   BUN 10 6 - 20 mg/dL   Creatinine, Ser 1.11 (H) 0.44 - 1.00 mg/dL   Calcium 9.1 8.9 - 10.3 mg/dL   GFR calc non Af Amer 43 (L) >60 mL/min   GFR calc Af Amer 50 (L) >60 mL/min    Comment: (NOTE) The eGFR has been calculated using the CKD EPI equation. This calculation has not been validated in all clinical situations. eGFR's persistently <60 mL/min signify possible Chronic Kidney Disease.    Anion gap 10 5  - 15  Troponin I     Status: None   Collection Time: 02/15/15  1:54 PM  Result Value Ref Range   Troponin I <0.03 <0.031 ng/mL    Comment:        NO INDICATION OF MYOCARDIAL INJURY.   Troponin I     Status: None   Collection Time: 02/15/15  6:25 PM  Result Value Ref Range   Troponin I <0.03 <0.031 ng/mL    Comment:        NO INDICATION OF MYOCARDIAL INJURY.   Troponin I     Status: None   Collection Time: 02/15/15 11:39 PM  Result Value Ref Range   Troponin I <0.03 <0.031 ng/mL    Comment:        NO INDICATION OF MYOCARDIAL INJURY.   Basic metabolic panel     Status: Abnormal   Collection Time: 02/16/15  3:51 AM  Result Value Ref Range   Sodium 142 135 - 145 mmol/L   Potassium 3.1 (L) 3.5 - 5.1 mmol/L   Chloride 103 101 - 111 mmol/L   CO2 30 22 - 32 mmol/L   Glucose, Bld 97 65 - 99 mg/dL   BUN 8 6 - 20 mg/dL   Creatinine, Ser 0.96 0.44 - 1.00 mg/dL   Calcium 8.5 (L) 8.9 - 10.3 mg/dL   GFR calc non Af Amer 51 (L) >60 mL/min   GFR calc Af Amer 59 (L) >60 mL/min    Comment: (NOTE) The eGFR has been calculated using the CKD EPI equation. This calculation has not been validated in all clinical situations. eGFR's persistently <60 mL/min signify possible Chronic Kidney Disease.    Anion gap 9 5 - 15  Magnesium     Status: Abnormal   Collection Time: 02/16/15  3:51 AM  Result Value Ref Range   Magnesium 1.4 (L) 1.7 - 2.4 mg/dL    Dg Ribs Unilateral W/chest Right  02/15/2015   CLINICAL DATA:  [place] last night walking with a walker. Right posterior chest wall pain.  EXAM: RIGHT RIBS AND CHEST - 3+ VIEW  COMPARISON:  07/04/2007  FINDINGS: Heart size is normal. There is calcification of the aorta. Port-A-Cath on the right has its tip in the SVC 2 cm above the right atrium. The lungs are clear. No pneumothorax or hemothorax. The bones are osteopenic, but I do not see a definite rib  fracture. One could question a nondisplaced fracture of the right fifth rib posteriorly, but this  is not definite. On the oblique view, there appears to be a partial compression fracture of T6, age indeterminate.  IMPRESSION: No active cardiopulmonary disease. Question nondisplaced fracture of the right posterior fifth rib. Partial compression fracture of T6, age indeterminate.   Electronically Signed   By: Nelson Chimes M.D.   On: 02/15/2015 15:03   Dg Lumbar Spine Complete  02/15/2015   CLINICAL DATA:  Loss of consciousness with back pain and laceration  EXAM: LUMBAR SPINE - COMPLETE 4+ VIEW  COMPARISON:  None currently available  FINDINGS: Posterior fixation from L3-L5 with rod and pedicle screws. Bone cement present in the L4 body which has indistinct cortical margins. There is no evidence of hardware or bony fracture. No subluxation. Degenerative disc change which is greatest adjacent to the fusion at L2-3, with disc narrowing and vacuum phenomenon.  Diffuse atherosclerosis.  Osteopenia.  IMPRESSION: 1. No acute findings. 2. L4 body bone cement and L3-L5 posterior fixation.   Electronically Signed   By: Monte Fantasia M.D.   On: 02/15/2015 13:10   Ct Head Wo Contrast  02/15/2015   CLINICAL DATA:  Loss of consciousness with back pain and laceration.  EXAM: CT HEAD WITHOUT CONTRAST  TECHNIQUE: Contiguous axial images were obtained from the base of the skull through the vertex without intravenous contrast.  COMPARISON:  Brain MRI 02/16/2007  FINDINGS: Skull and Sinuses:Negative for fracture or destructive process. The mastoids, middle ears, and imaged paranasal sinuses are clear.  Orbits: Bilateral cataract resection.  Brain: No evidence of acute infarct, hemorrhage, hydrocephalus, or shift. 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 Stable 17 mm left CP angle mass, likely a meningioma based on previous imaging.  IMPRESSION: 1. No acute findings. 2. 17 mm left CP  angle mass is stable over multiple years, likely a meningioma. 3. Age related volume loss and white matter disease.   Electronically Signed   By: Monte Fantasia M.D.   On: 02/15/2015 13:18   US Carotid Bilateral  02/16/2015   CLINICAL DATA:  Recent CVA  EXAM: BILATERAL CAROTID DUPLEX ULTRASOUND  TECHNIQUE: Pearline Cables scale imaging, color Doppler and duplex ultrasound were performed of bilateral carotid and vertebral arteries in the neck.  COMPARISON:  None.  FINDINGS: Criteria: Quantification of carotid stenosis is based on velocity parameters that correlate the residual internal carotid diameter with NASCET-based stenosis levels, using the diameter of the distal internal carotid lumen as the denominator for stenosis measurement.  The following velocity measurements were obtained:  RIGHT  ICA:  144/19 cm/sec  CCA:  440/10 cm/sec  SYSTOLIC ICA/CCA RATIO:  1.1  DIASTOLIC ICA/CCA RATIO:  1  ECA:  158 cm/sec  LEFT  ICA:  166/25 cm/sec  CCA:  27/25 cm/sec  SYSTOLIC ICA/CCA RATIO:  1.8  DIASTOLIC ICA/CCA RATIO:  2.3  ECA:  191 cm/sec  RIGHT CAROTID ARTERY: Grayscale images demonstrate atherosclerotic plaque in the carotid bulb and extending into the proximal internal carotid artery. The waveforms, velocities and flow velocity ratios show a stenosis in the 50-69% range in the lower end of that spectrum.  RIGHT VERTEBRAL ARTERY:  Antegrade in nature.  LEFT CAROTID ARTERY: Grayscale images demonstrate atherosclerotic change with plaque formation similar to that seen on the right. The waveforms, velocities and flow velocity ratios show evidence of  a stenosis in the 50-69% range in the middle of that spectrum.  LEFT VERTEBRAL ARTERY:  Antegrade in nature.  IMPRESSION: Bilateral 50-69% stenosis within the internal carotid arteries.   Electronically Signed   By: Inez Catalina M.D.   On: 02/16/2015 09:07   Dg Hip Unilat With Pelvis 2-3 Views Right  02/15/2015   CLINICAL DATA:  Status post fall last night with walker.  EXAM: RIGHT  HIP (WITH PELVIS) 2-3 VIEWS  COMPARISON:  None.  FINDINGS: There is generalized osteopenia. There is no hip fracture or dislocation. There is no lytic or sclerotic osseous lesion. There are degenerative changes of the pubic symphysis.  There are mild degenerative changes of bilateral SI joints. There is posterior lumbar interbody fusion of the lower lumbar spine.  IMPRESSION: No acute osseous injury of the right hip. Given the patient's age and osteopenia, if there is persistent clinical concern for an occult hip fracture, a MRI of the hip is recommended for increased sensitivity.   Electronically Signed   By: Kathreen Devoid   On: 02/15/2015 15:01    Review of Systems  Constitutional: Negative.   HENT: Negative.   Eyes: Negative.   Respiratory: Negative.   Cardiovascular: Negative.   Gastrointestinal: Negative.   Genitourinary: Negative.   Musculoskeletal: Positive for back pain and joint pain.  Skin: Negative.   Neurological: Positive for loss of consciousness.  Endo/Heme/Allergies: Negative.   Psychiatric/Behavioral: Negative.    Blood pressure 145/58, pulse 84, temperature 98.1 F (36.7 C), temperature source Oral, resp. rate 18, height '5\' 5"'  (1.651 m), weight 71.351 kg (157 lb 4.8 oz), SpO2 95 %. Physical Exam  Assessment/Plan:  pauses  sick sinus syndrome  syncope  polycythemia  chronic lower back pain  hyper lipidemia . PLAN  AND AGREE WITH IT MADE FOR EVALUATION FOR SYNCOPE  recommend permanent pacemaker placement for pauses  preop for permanent pacemaker placement  continue Lipitor therapy for lipid management  agree with treatment for polycythemia  continue chronic lower back pain with tramadol  will try to arrange permanent pacemaker placement for early next week Monday or Tuesday with Dr. Saralyn Pilar  echocardiogram may be helpful for assessment and evaluation  CALLWOOD,DWAYNE D. 02/16/2015, 12:19 PM

## 2015-02-16 NOTE — Progress Notes (Signed)
No further pauses, pt remains in SR.  Pt in NAD, skin warm and dry, up in recliner visiting with family.  Pt denies any pain or discomfort at this time.  Dressing on L FA skin tear changed,  Pt for probable PMP on Monday with Dr. Clayborn Bigness.  Continued monitoring.

## 2015-02-16 NOTE — Progress Notes (Signed)
Per tele monitor pt had 3 sec, 12 sec, and 2 sec pause.  RN immediately to room.  Pt Asymptomatic, pt was coughing and reported "I cough a lot when I drink water".  Dr. Leslye Peer aware, in  room to assess pt.  Consult to Dr. Clayborn Bigness made.

## 2015-02-16 NOTE — Evaluation (Signed)
Physical Therapy Evaluation Patient Details Name: Krista Ortiz MRN: [id number] DOB: [birth year] Today's Date: 02/16/2015   History of Present Illness  81 yo female with onset of syncope after HR had 12 second pause, with new pauses noted at [facility].  Pacemaker insertion scheduled.  Clinical Impression  Pt was seen for assessment of gait with recent syncope, but is noted mainly to be generally weak from chronic conditions.  Pt is having a pacemaker insertion and may need to upgrade her to SNF care if this leaves her weaker.  Will reassess as she progresses with her care here.    Follow Up Recommendations Home health PT    Equipment Recommendations  None recommended by PT    Recommendations for Other Services       Precautions / Restrictions Precautions Precautions: Other (comment);Fall (syncope from pacer being needed) Restrictions Weight Bearing Restrictions: No      Mobility  Bed Mobility Overal bed mobility: Needs Assistance Bed Mobility: Supine to Sit     Supine to sit: Min assist     General bed mobility comments: assisted under trunk to ease due to R rib fracture  Transfers Overall transfer level: Needs assistance Equipment used: Rolling walker (2 wheeled);1 person hand held assist Transfers: Sit to/from [facility] Sit to Stand: Min guard;Min assist Stand pivot transfers: Min guard       General transfer comment: care due to her R rib fracture and pauses in HR that have caused recent syncope  Ambulation/Gait Ambulation/Gait assistance: Min guard Ambulation Distance (Feet): 175 Feet Assistive device: Rolling walker (2 wheeled);1 person hand held assist Gait Pattern/deviations: Step-through pattern;Wide base of support;Trunk flexed Gait velocity: normal Gait velocity interpretation: at or above normal speed for age/gender General Gait Details: care due to syncope but has not lost balance with paced activity  Stairs             Wheelchair Mobility    Modified Rankin (Stroke Patients Only)       Balance Overall balance assessment: Needs assistance Sitting-balance support: Feet supported Sitting balance-Leahy Scale: Good     Standing balance support: Bilateral upper extremity supported Standing balance-Leahy Scale: Fair                               Pertinent Vitals/Pain Pain Assessment: No/denies pain    Home Living Family/patient expects to be discharged to:: Private residence Living Arrangements: Alone Available Help at Discharge: Family Type of Home: Apartment Home Access: Level entry     Home Layout: One level Home Equipment: Environmental consultant - 4 wheels;Shower seat;Toilet riser      Prior Function Level of Independence: Independent with assistive device(s)               Hand Dominance        Extremity/Trunk Assessment   Upper Extremity Assessment: Overall WFL for tasks assessed           Lower Extremity Assessment: Generalized weakness      Cervical / Trunk Assessment: Kyphotic  Communication   Communication: HOH  Cognition Arousal/Alertness: Awake/alert Behavior During Therapy: WFL for tasks assessed/performed Overall Cognitive Status: Within Functional Limits for tasks assessed                      General Comments General comments (skin integrity, edema, etc.): maintained care with gait due to 11 second HR pause this am with no reaction from pt per  nsg    Exercises        Assessment/Plan    PT Assessment Patient needs continued PT services  PT Diagnosis Generalized weakness   PT Problem List Decreased strength;Decreased range of motion;Decreased activity tolerance;Decreased balance;Decreased mobility;Decreased coordination;Cardiopulmonary status limiting activity  PT Treatment Interventions DME instruction;Gait training;Functional mobility training;Therapeutic activities;Therapeutic exercise;Balance training;Neuromuscular  re-education;Patient/family education   PT Goals (Current goals can be found in the Care Plan section) Acute Rehab PT Goals Patient Stated Goal: to be healthy and safe PT Goal Formulation: With patient Time For Goal Achievement: 03/02/15 Potential to Achieve Goals: Good    Frequency Min 2X/week   Barriers to discharge Decreased caregiver support home alone    Co-evaluation               End of Session Equipment Utilized During Treatment: Gait belt Activity Tolerance: Patient tolerated treatment well Patient left: in chair;with call bell/phone within reach;with chair alarm set Nurse Communication: Mobility status    Functional Assessment Tool Used: clinical judgment Functional Limitation: Mobility: Walking and moving around Mobility: Walking and Moving Around Current Status [phone]): At least 20 percent but less than 40 percent impaired, limited or restricted Mobility: Walking and Moving Around Goal Status [phone]): At least 1 percent but less than 20 percent impaired, limited or restricted    Time: 1158-1223 PT Time Calculation (min) (ACUTE ONLY): 25 min   Charges:   PT Evaluation $Initial PT Evaluation Tier I: 1 Procedure PT Treatments $Gait Training: 8-22 mins   PT G Codes:   PT G-Codes **NOT FOR INPATIENT CLASS** Functional Assessment Tool Used: clinical judgment Functional Limitation: Mobility: Walking and moving around Mobility: Walking and Moving Around Current Status ([id number]): At least 20 percent but less than 40 percent impaired, limited or restricted Mobility: Walking and Moving Around Goal Status [phone]): At least 1 percent but less than 20 percent impaired, limited or restricted    Ramond Dial 02/16/2015, 1:12 PM   Mee Hives, PT MS Acute Rehab Dept. Number: [facility] [id number] and [place] [phone]

## 2015-02-16 NOTE — Progress Notes (Signed)
Patient ID: Krista Ortiz, female   DOB: May 18, 1924, 81 y.o.   MRN: [id number] [facility] PROGRESS NOTE PCP: Hortencia Pilar, MD  HPI/Subjective: Patient feels well. She was hoping to go home today. At home she went to the refrigerator and got herself to drink water. She took a sip and got choked a little bit. She started walking with her walker.  The next thing she knows she woke up on the floor.  Here in the hospital, the patient drank some water and got choked on it a little bit and had a 12 second pause. She also had a another pause of about 2+ seconds. Patient is not on any rate controlling medications.  Objective: Filed Vitals:   02/16/15 0509  BP: 104/70  Pulse: 74  Temp: 97.7 F (36.5 C)  Resp: 18    Intake/Output Summary (Last 24 hours) at 02/16/15 0921 Last data filed at 02/15/15 2128  Gross per 24 hour  Intake      0 ml  Output    450 ml  Net   -450 ml   Filed Weights   02/15/15 1205 02/15/15 1633 02/16/15 0509  Weight: 70.761 kg (156 lb) 69.99 kg (154 lb 4.8 oz) 71.351 kg (157 lb 4.8 oz)    ROS: Review of Systems  Constitutional: Negative for fever and chills.  Eyes: Negative for blurred vision.  Respiratory: Negative for cough and shortness of breath.   Cardiovascular: Negative for chest pain.  Gastrointestinal: Negative for nausea, vomiting, abdominal pain, diarrhea and constipation.  Genitourinary: Negative for dysuria.  Musculoskeletal: Negative for joint pain.  Neurological: Negative for dizziness and headaches.   Exam: Physical Exam  HENT:  Nose: No mucosal edema.  Mouth/Throat: No oropharyngeal exudate or posterior oropharyngeal edema.  Eyes: Conjunctivae, EOM and lids are normal. Pupils are equal, round, and reactive to light.  Neck: No JVD present. Carotid bruit is not present. No edema present. No thyroid mass and no thyromegaly present.  Cardiovascular: S1 normal and S2 normal.  Exam reveals no gallop.   No murmur  heard. Pulses:      Dorsalis pedis pulses are 2+ on the right side, and 2+ on the left side.  Respiratory: No respiratory distress. She has wheezes in the right lower field and the left lower field. She has no rhonchi. She has no rales.  GI: Soft. Bowel sounds are normal. There is no tenderness.  Musculoskeletal:       Right ankle: She exhibits swelling.       Left ankle: She exhibits swelling.  Lymphadenopathy:    She has no cervical adenopathy.  Neurological: She is alert. No cranial nerve deficit.  Skin: Skin is warm. No rash noted. Nails show no clubbing.  Psychiatric: She has a normal mood and affect.    Data Reviewed: Basic Metabolic Panel:  Recent Labs Lab 02/15/15 1354 02/16/15 0351  NA 139 142  K 3.1* 3.1*  CL 99* 103  CO2 30 30  GLUCOSE 100* 97  BUN 10 8  CREATININE 1.11* 0.96  CALCIUM 9.1 8.5*  MG  --  1.4*   CBC:  Recent Labs Lab 02/15/15 1354  WBC 6.2  HGB 14.8  HCT 46.7  MCV 83.8  PLT 396   Cardiac Enzymes:  Recent Labs Lab 02/15/15 1354 02/15/15 1825 02/15/15 2339  TROPONINI <0.03 <0.03 <0.03   Studies: Dg Ribs Unilateral W/chest Right  02/15/2015   CLINICAL DATA:  [place] last night walking with a walker. Right posterior  chest wall pain.  EXAM: RIGHT RIBS AND CHEST - 3+ VIEW  COMPARISON:  07/04/2007  FINDINGS: Heart size is normal. There is calcification of the aorta. Port-A-Cath on the right has its tip in the SVC 2 cm above the right atrium. The lungs are clear. No pneumothorax or hemothorax. The bones are osteopenic, but I do not see a definite rib fracture. One could question a nondisplaced fracture of the right fifth rib posteriorly, but this is not definite. On the oblique view, there appears to be a partial compression fracture of T6, age indeterminate.  IMPRESSION: No active cardiopulmonary disease. Question nondisplaced fracture of the right posterior fifth rib. Partial compression fracture of T6, age indeterminate.   Electronically Signed    By: Nelson Chimes M.D.   On: 02/15/2015 15:03   Dg Lumbar Spine Complete  02/15/2015   CLINICAL DATA:  Loss of consciousness with back pain and laceration  EXAM: LUMBAR SPINE - COMPLETE 4+ VIEW  COMPARISON:  None currently available  FINDINGS: Posterior fixation from L3-L5 with rod and pedicle screws. Bone cement present in the L4 body which has indistinct cortical margins. There is no evidence of hardware or bony fracture. No subluxation. Degenerative disc change which is greatest adjacent to the fusion at L2-3, with disc narrowing and vacuum phenomenon.  Diffuse atherosclerosis.  Osteopenia.  IMPRESSION: 1. No acute findings. 2. L4 body bone cement and L3-L5 posterior fixation.   Electronically Signed   By: Monte Fantasia M.D.   On: 02/15/2015 13:10   Ct Head Wo Contrast  02/15/2015   CLINICAL DATA:  Loss of consciousness with back pain and laceration.  EXAM: CT HEAD WITHOUT CONTRAST  TECHNIQUE: Contiguous axial images were obtained from the base of the skull through the vertex without intravenous contrast.  COMPARISON:  Brain MRI 02/16/2007  FINDINGS: Skull and Sinuses:Negative for fracture or destructive process. The mastoids, middle ears, and imaged paranasal sinuses are clear.  Orbits: Bilateral cataract resection.  Brain: No evidence of acute infarct, hemorrhage, hydrocephalus, or shift. 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 Stable 17 mm left CP angle mass, likely a meningioma based on previous imaging.  IMPRESSION: 1. No acute findings. 2. 17 mm left CP angle mass is stable over multiple years, likely a meningioma. 3. Age related volume loss and white matter disease.   Electronically Signed   By: Monte Fantasia M.D.   On: 02/15/2015 13:18   US Carotid Bilateral  02/16/2015   CLINICAL DATA:  Recent CVA  EXAM: BILATERAL CAROTID DUPLEX ULTRASOUND  TECHNIQUE:  Pearline Cables scale imaging, color Doppler and duplex ultrasound were performed of bilateral carotid and vertebral arteries in the neck.  COMPARISON:  None.  FINDINGS: Criteria: Quantification of carotid stenosis is based on velocity parameters that correlate the residual internal carotid diameter with NASCET-based stenosis levels, using the diameter of the distal internal carotid lumen as the denominator for stenosis measurement.  The following velocity measurements were obtained:  RIGHT  ICA:  144/19 cm/sec  CCA:  366/44 cm/sec  SYSTOLIC ICA/CCA RATIO:  1.1  DIASTOLIC ICA/CCA RATIO:  1  ECA:  158 cm/sec  LEFT  ICA:  166/25 cm/sec  CCA:  03/47 cm/sec  SYSTOLIC ICA/CCA RATIO:  1.8  DIASTOLIC ICA/CCA RATIO:  2.3  ECA:  191 cm/sec  RIGHT CAROTID ARTERY: Grayscale images demonstrate atherosclerotic plaque in the carotid bulb and extending  into the proximal internal carotid artery. The waveforms, velocities and flow velocity ratios show a stenosis in the 50-69% range in the lower end of that spectrum.  RIGHT VERTEBRAL ARTERY:  Antegrade in nature.  LEFT CAROTID ARTERY: Grayscale images demonstrate atherosclerotic change with plaque formation similar to that seen on the right. The waveforms, velocities and flow velocity ratios show evidence of a stenosis in the 50-69% range in the middle of that spectrum.  LEFT VERTEBRAL ARTERY:  Antegrade in nature.  IMPRESSION: Bilateral 50-69% stenosis within the internal carotid arteries.   Electronically Signed   By: Inez Catalina M.D.   On: 02/16/2015 09:07   Dg Hip Unilat With Pelvis 2-3 Views Right  02/15/2015   CLINICAL DATA:  Status post fall last night with walker.  EXAM: RIGHT HIP (WITH PELVIS) 2-3 VIEWS  COMPARISON:  None.  FINDINGS: There is generalized osteopenia. There is no hip fracture or dislocation. There is no lytic or sclerotic osseous lesion. There are degenerative changes of the pubic symphysis.  There are mild degenerative changes of bilateral SI joints. There is  posterior lumbar interbody fusion of the lower lumbar spine.  IMPRESSION: No acute osseous injury of the right hip. Given the patient's age and osteopenia, if there is persistent clinical concern for an occult hip fracture, a MRI of the hip is recommended for increased sensitivity.   Electronically Signed   By: Kathreen Devoid   On: 02/15/2015 15:01    Scheduled Meds: . aspirin EC  81 mg Oral Daily  . atorvastatin  10 mg Oral Daily  . enoxaparin (LOVENOX) injection  40 mg Subcutaneous Q24H  . hydroxyurea  500 mg Oral Once per day on Mon Wed Fri  . potassium chloride  10 mEq Oral Daily  . traMADol  50 mg Oral QPM    Assessment/Plan:  1. Sick sinus syndrome: Patient had syncope and collapse at home. Patient had a 12 second pause in another 2+ second pause here on telemetry monitoring. Patient is not on any rate controlling medications. I spoke with Dr. Clayborn Bigness cardiology who is covering for Dr. Nehemiah Massed. He will come evaluate the patient for possible pacemaker placement. I will stop the Plavix and use aspirin instead. 2. Hypokalemia. I will hold Lasix this morning and give extra potassium supplementation and check magnesium and replace if low. 3. Carotid stenosis seen on ultrasound of the carotids. Will likely need CT angio of the carotids at some point. 4. Hyperlipidemia unspecified continue atorvastatin. 5. Patient states that she is on hydroxyurea because her blood is too thick. 6. Sleep apnea on CPAP at night.  Code Status:     Code Status Orders        Start     Ordered   02/15/15 1646  Full code   Continuous     02/15/15 1645     Disposition Plan: Home potentially Monday or Tuesday after pacer.  Time spent: 35 minutes  Loletha Grayer  [facility]

## 2015-02-17 LAB — BASIC METABOLIC PANEL
Anion gap: 4 — ABNORMAL LOW (ref 5–15)
BUN: 9 mg/dL (ref 6–20)
CALCIUM: 8.4 mg/dL — AB (ref 8.9–10.3)
CHLORIDE: 107 mmol/L (ref 101–111)
CO2: 28 mmol/L (ref 22–32)
Creatinine, Ser: 0.94 mg/dL (ref 0.44–1.00)
GFR calc Af Amer: >60 mL/min (ref >60)
GFR calc non Af Amer: 52 mL/min — ABNORMAL LOW (ref >60)
Glucose, Bld: 101 mg/dL — ABNORMAL HIGH (ref 65–99)
Potassium: 3.5 mmol/L (ref 3.5–5.1)
SODIUM: 139 mmol/L (ref 135–145)

## 2015-02-17 LAB — MAGNESIUM: MAGNESIUM: 2 mg/dL (ref 1.7–2.4)

## 2015-02-17 MED ORDER — POLYETHYLENE GLYCOL 3350 17 G PO PACK
17.0000 g | PACK | Freq: Every day | ORAL | Status: DC
  Administered 2015-02-18 – 2015-02-19 (×2): 17 g via ORAL
  Filled 2015-02-17 (×3): qty 1

## 2015-02-17 NOTE — Progress Notes (Signed)
Subjective:   patient doing reasonably well no significant chest pain weakness resting comfortably denies any palpitations or tachycardia no syncope  Objective:  Vital Signs in the last 24 hours: Temp:  [97.7 F (36.5 C)-98.4 F (36.9 C)] 97.7 F (36.5 C) (06/12 1110) Pulse Rate:  [65-67] 67 (06/12 1110) Resp:  [18-20] 20 (06/12 1110) BP: (130-147)/(45-57) 132/57 mmHg (06/12 1110) SpO2:  [94 %-95 %] 94 % (06/12 1110) Weight:  [72.213 kg (159 lb 3.2 oz)] 72.213 kg (159 lb 3.2 oz) (06/12 0429)  Intake/Output from previous day: 06/11 0701 - 06/12 0700 In: 720 [P.O.:720] Out: 1475 [Urine:1475] Intake/Output from this shift: Total I/O In: 240 [P.O.:240] Out: 550 [Urine:550]  Physical Exam: General appearance: alert and cooperative Neck: no adenopathy, no carotid bruit, no JVD, supple, symmetrical, trachea midline and thyroid not enlarged, symmetric, no tenderness/mass/nodules Lungs: clear to auscultation bilaterally Heart: regular rate and rhythm, S1, S2 normal, no murmur, click, rub or gallop, prominent apical impulse and Bradycardia Abdomen: soft, non-tender; bowel sounds normal; no masses,  no organomegaly Extremities: extremities normal, atraumatic, no cyanosis or edema Pulses: 2+ and symmetric Skin: Skin color, texture, turgor normal. No rashes or lesions Neurologic: Grossly normal  Lab Results:  Recent Labs  02/15/15 1354  WBC 6.2  HGB 14.8  PLT 396    Recent Labs  02/16/15 0351 02/17/15 0421  NA 142 139  K 3.1* 3.5  CL 103 107  CO2 30 28  GLUCOSE 97 101*  BUN 8 9  CREATININE 0.96 0.94    Recent Labs  02/15/15 1825 02/15/15 2339  TROPONINI <0.03 <0.03   Hepatic Function Panel No results for input(s): PROT, ALBUMIN, AST, ALT, ALKPHOS, BILITOT, BILIDIR, IBILI in the last 72 hours. No results for input(s): CHOL in the last 72 hours. No results for input(s): PROTIME in the last 72 hours.  Imaging: Imaging results have been reviewed  Cardiac  Studies:  Assessment/Plan:   preop for permanent pacemaker placement Arrhythmia Edema Hypotension/Shock Palpitations Shortness of Breath Syncope Bradycardia   pauses/ sinus arrest  polycythemia  hyperlipidemia  syncope  sick sinus syndrome . PLAN  continue telemetry  agree with echocardiogram  proceed with permanent pacemaker placement  continue lipid management  With Lipitor  continue hydroxyurea for polycythemia  continue pain control  continue low-dose aspirin therapy for arteriosclerotic vascular disease  in p.o. In the morning for permanent pacemaker placement    LOS: 1 day    CALLWOOD,DWAYNE D. 02/17/2015, 5:08 PM

## 2015-02-17 NOTE — Progress Notes (Signed)
Patient ID: Krista Ortiz, female   DOB: [birth year], 81 y.o.   MRN: [id number] [facility] PROGRESS NOTE PCP: Hortencia Pilar, MD  HPI/Subjective: Feels okay. Some rib pain. Patient has not had anymore pauses since yesterday.  Objective: Filed Vitals:   02/17/15 0429  BP: 130/45  Pulse: 65  Temp: 97.8 F (36.6 C)  Resp: 20    Intake/Output Summary (Last 24 hours) at 02/17/15 0853 Last data filed at 02/17/15 0434  Gross per 24 hour  Intake    480 ml  Output   1475 ml  Net   -995 ml   Filed Weights   02/15/15 1633 02/16/15 0509 02/17/15 0429  Weight: 69.99 kg (154 lb 4.8 oz) 71.351 kg (157 lb 4.8 oz) 72.213 kg (159 lb 3.2 oz)    ROS: Review of Systems  Constitutional: Negative for fever and chills.  Eyes: Negative for blurred vision.  Respiratory: Negative for cough and shortness of breath.   Cardiovascular: Negative for chest pain.  Gastrointestinal: Positive for constipation. Negative for nausea, vomiting, abdominal pain and diarrhea.  Genitourinary: Negative for dysuria.  Musculoskeletal: Negative for joint pain.  Neurological: Negative for dizziness and headaches.   Exam: Physical Exam  HENT:  Nose: No mucosal edema.  Mouth/Throat: No oropharyngeal exudate or posterior oropharyngeal edema.  Eyes: Conjunctivae, EOM and lids are normal. Pupils are equal, round, and reactive to light.  Neck: No JVD present. Carotid bruit is not present. No edema present. No thyroid mass and no thyromegaly present.  Cardiovascular: S1 normal and S2 normal.  Exam reveals no gallop.   No murmur heard. Pulses:      Dorsalis pedis pulses are 2+ on the right side, and 2+ on the left side.  Respiratory: No respiratory distress. She has no wheezes. She has no rhonchi. She has no rales.  GI: Soft. Bowel sounds are normal. There is no tenderness.  Musculoskeletal:       Right ankle: She exhibits swelling.       Left ankle: She exhibits swelling.  Lymphadenopathy:    She  has no cervical adenopathy.  Neurological: She is alert. No cranial nerve deficit.  Skin: Skin is warm. No rash noted. Nails show no clubbing.  Psychiatric: She has a normal mood and affect.    Data Reviewed: Basic Metabolic Panel:  Recent Labs Lab 02/15/15 1354 02/16/15 0351 02/17/15 0421  NA 139 142 139  K 3.1* 3.1* 3.5  CL 99* 103 107  CO2 30 30 28   GLUCOSE 100* 97 101*  BUN 10 8 9   CREATININE 1.11* 0.96 0.94  CALCIUM 9.1 8.5* 8.4*  MG  --  1.4* 2.0   CBC:  Recent Labs Lab 02/15/15 1354  WBC 6.2  HGB 14.8  HCT 46.7  MCV 83.8  PLT 396   Cardiac Enzymes:  Recent Labs Lab 02/15/15 1354 02/15/15 1825 02/15/15 2339  TROPONINI <0.03 <0.03 <0.03   Studies: Dg Ribs Unilateral W/chest Right  02/15/2015   CLINICAL DATA:  [place] last night walking with a walker. Right posterior chest wall pain.  EXAM: RIGHT RIBS AND CHEST - 3+ VIEW  COMPARISON:  07/04/2007  FINDINGS: Heart size is normal. There is calcification of the aorta. Port-A-Cath on the right has its tip in the SVC 2 cm above the right atrium. The lungs are clear. No pneumothorax or hemothorax. The bones are osteopenic, but I do not see a definite rib fracture. One could question a nondisplaced fracture of the right fifth rib posteriorly,  but this is not definite. On the oblique view, there appears to be a partial compression fracture of T6, age indeterminate.  IMPRESSION: No active cardiopulmonary disease. Question nondisplaced fracture of the right posterior fifth rib. Partial compression fracture of T6, age indeterminate.   Electronically Signed   By: Nelson Chimes M.D.   On: 02/15/2015 15:03   Dg Lumbar Spine Complete  02/15/2015   CLINICAL DATA:  Loss of consciousness with back pain and laceration  EXAM: LUMBAR SPINE - COMPLETE 4+ VIEW  COMPARISON:  None currently available  FINDINGS: Posterior fixation from L3-L5 with rod and pedicle screws. Bone cement present in the L4 body which has indistinct cortical margins.  There is no evidence of hardware or bony fracture. No subluxation. Degenerative disc change which is greatest adjacent to the fusion at L2-3, with disc narrowing and vacuum phenomenon.  Diffuse atherosclerosis.  Osteopenia.  IMPRESSION: 1. No acute findings. 2. L4 body bone cement and L3-L5 posterior fixation.   Electronically Signed   By: Monte Fantasia M.D.   On: 02/15/2015 13:10   Ct Head Wo Contrast  02/15/2015   CLINICAL DATA:  Loss of consciousness with back pain and laceration.  EXAM: CT HEAD WITHOUT CONTRAST  TECHNIQUE: Contiguous axial images were obtained from the base of the skull through the vertex without intravenous contrast.  COMPARISON:  Brain MRI 02/16/2007  FINDINGS: Skull and Sinuses:Negative for fracture or destructive process. The mastoids, middle ears, and imaged paranasal sinuses are clear.  Orbits: Bilateral cataract resection.  Brain: No evidence of acute infarct, hemorrhage, hydrocephalus, or shift. 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 Stable 17 mm left CP angle mass, likely a meningioma based on previous imaging.  IMPRESSION: 1. No acute findings. 2. 17 mm left CP angle mass is stable over multiple years, likely a meningioma. 3. Age related volume loss and white matter disease.   Electronically Signed   By: Monte Fantasia M.D.   On: 02/15/2015 13:18   US Carotid Bilateral  02/16/2015   CLINICAL DATA:  Recent CVA  EXAM: BILATERAL CAROTID DUPLEX ULTRASOUND  TECHNIQUE: Pearline Cables scale imaging, color Doppler and duplex ultrasound were performed of bilateral carotid and vertebral arteries in the neck.  COMPARISON:  None.  FINDINGS: Criteria: Quantification of carotid stenosis is based on velocity parameters that correlate the residual internal carotid diameter with NASCET-based stenosis levels, using the diameter of the distal internal carotid  lumen as the denominator for stenosis measurement.  The following velocity measurements were obtained:  RIGHT  ICA:  144/19 cm/sec  CCA:  151/76 cm/sec  SYSTOLIC ICA/CCA RATIO:  1.1  DIASTOLIC ICA/CCA RATIO:  1  ECA:  158 cm/sec  LEFT  ICA:  166/25 cm/sec  CCA:  16/07 cm/sec  SYSTOLIC ICA/CCA RATIO:  1.8  DIASTOLIC ICA/CCA RATIO:  2.3  ECA:  191 cm/sec  RIGHT CAROTID ARTERY: Grayscale images demonstrate atherosclerotic plaque in the carotid bulb and extending into the proximal internal carotid artery. The waveforms, velocities and flow velocity ratios show a stenosis in the 50-69% range in the lower end of that spectrum.  RIGHT VERTEBRAL ARTERY:  Antegrade in nature.  LEFT CAROTID ARTERY: Grayscale images demonstrate atherosclerotic change with plaque formation similar to that seen on the right. The waveforms, velocities and flow velocity ratios show evidence of a stenosis in the 50-69% range in the middle of that spectrum.  LEFT VERTEBRAL ARTERY:  Antegrade in nature.  IMPRESSION: Bilateral 50-69% stenosis within the internal carotid arteries.   Electronically Signed   By: Inez Catalina M.D.   On: 02/16/2015 09:07   Dg Hip Unilat With Pelvis 2-3 Views Right  02/15/2015   CLINICAL DATA:  Status post fall last night with walker.  EXAM: RIGHT HIP (WITH PELVIS) 2-3 VIEWS  COMPARISON:  None.  FINDINGS: There is generalized osteopenia. There is no hip fracture or dislocation. There is no lytic or sclerotic osseous lesion. There are degenerative changes of the pubic symphysis.  There are mild degenerative changes of bilateral SI joints. There is posterior lumbar interbody fusion of the lower lumbar spine.  IMPRESSION: No acute osseous injury of the right hip. Given the patient's age and osteopenia, if there is persistent clinical concern for an occult hip fracture, a MRI of the hip is recommended for increased sensitivity.   Electronically Signed   By: Kathreen Devoid   On: 02/15/2015 15:01    Scheduled Meds: . aspirin  EC  81 mg Oral Daily  . atorvastatin  10 mg Oral Daily  . enoxaparin (LOVENOX) injection  40 mg Subcutaneous Q24H  . hydroxyurea  500 mg Oral Once per day on Mon Wed Fri  . potassium chloride  10 mEq Oral Daily  . traMADol  50 mg Oral QPM    Assessment/Plan:  1. Sick sinus syndrome: Patient had syncope and collapse at home. Patient had a 12 second pause in another 2+ second pause here on telemetry monitoring. Patient is not on any rate controlling medications. Likely pacemaker placement on Monday. Will keep nothing by mouth after midnight. I will hold Lovenox injection. 2. Hypokalemia and hypomagnesemia.  These electrolytes were replaced yesterday. In normal range today 3. Carotid stenosis seen on ultrasound of the carotids. Will likely need CT angio of the carotids at some point as outpatient. 4. Hyperlipidemia unspecified continue atorvastatin. 5. Polycythemia- on hydroxyurea. 6. Sleep apnea on CPAP at night. 7. Rib fracture closed secondary to syncope.  Code Status:     Code Status Orders        Start     Ordered   02/15/15 1646  Full code   Continuous     02/15/15 1645     Disposition Plan: Home potentially Tuesday after pacer.  Time spent: 25 minutes  Loletha Grayer  [facility]

## 2015-02-18 ENCOUNTER — Inpatient Hospital Stay: Payer: Medicare Other

## 2015-02-18 ENCOUNTER — Encounter: Payer: Self-pay | Admitting: Cardiology

## 2015-02-18 ENCOUNTER — Inpatient Hospital Stay: Payer: Medicare Other | Admitting: Anesthesiology

## 2015-02-18 ENCOUNTER — Encounter
Admission: EM | Disposition: A | Discharge status: Home / Self Care | Payer: Self-pay | Source: Home / Self Care | Attending: Internal Medicine

## 2015-02-18 HISTORY — PX: PACEMAKER INSERTION: SHX728

## 2015-02-18 LAB — CBC
HCT: 40.5 % (ref 35.0–47.0)
Hemoglobin: 12.9 g/dL (ref 12.0–16.0)
MCH: 26.9 pg (ref 26.0–34.0)
MCHC: 31.8 g/dL — AB (ref 32.0–36.0)
MCV: 84.6 fL (ref 80.0–100.0)
Platelets: 319 10*3/uL (ref 150–440)
RBC: 4.78 MIL/uL (ref 3.80–5.20)
RDW: 20.6 % — AB (ref 11.5–14.5)
WBC: 4 10*3/uL (ref 3.6–11.0)

## 2015-02-18 LAB — PROTIME-INR
INR: 0.94
PROTHROMBIN TIME: 12.8 s (ref 11.4–15.0)

## 2015-02-18 LAB — TYPE AND SCREEN
ABO/RH(D): A POS
ANTIBODY SCREEN: NEGATIVE

## 2015-02-18 LAB — ABO/RH: ABO/RH(D): A POS

## 2015-02-18 LAB — SURGICAL PCR SCREEN
MRSA, PCR: NEGATIVE
STAPHYLOCOCCUS AUREUS: NEGATIVE

## 2015-02-18 LAB — APTT: APTT: 31 s (ref 24–36)

## 2015-02-18 SURGERY — INSERTION, CARDIAC PACEMAKER
Anesthesia: General | Laterality: Left

## 2015-02-18 MED ORDER — PROPOFOL INFUSION 10 MG/ML OPTIME
INTRAVENOUS | Status: DC | PRN
  Administered 2015-02-18: 50 ug/kg/min via INTRAVENOUS

## 2015-02-18 MED ORDER — ONDANSETRON HCL 4 MG/2ML IJ SOLN: 4.0000 mg | Freq: Four times a day (QID) | INTRAMUSCULAR | Status: DC | PRN

## 2015-02-18 MED ORDER — SODIUM CHLORIDE 0.9 % IJ SOLN
INTRAMUSCULAR | Status: DC | PRN
  Administered 2015-02-18: 30 mL

## 2015-02-18 MED ORDER — IPRATROPIUM-ALBUTEROL 0.5-2.5 (3) MG/3ML IN SOLN
RESPIRATORY_TRACT | Status: AC
  Administered 2015-02-18: 3 mL via RESPIRATORY_TRACT
  Filled 2015-02-18: qty 3

## 2015-02-18 MED ORDER — SODIUM CHLORIDE 0.9 % IR SOLN
Status: DC | PRN
  Administered 2015-02-18: 250 mL

## 2015-02-18 MED ORDER — DEXTROSE 5 % IV SOLN
1.5000 g | INTRAVENOUS | Status: AC
  Administered 2015-02-18: 1.5 g via INTRAVENOUS
  Filled 2015-02-18: qty 1.5

## 2015-02-18 MED ORDER — DEXTROSE 50 % IV SOLN
INTRAVENOUS | Status: AC
  Filled 2015-02-18: qty 50

## 2015-02-18 MED ORDER — IPRATROPIUM-ALBUTEROL 0.5-2.5 (3) MG/3ML IN SOLN
3.0000 mL | Freq: Four times a day (QID) | RESPIRATORY_TRACT | Status: DC
  Administered 2015-02-18 – 2015-02-19 (×3): 3 mL via RESPIRATORY_TRACT
  Filled 2015-02-18 (×4): qty 3

## 2015-02-18 MED ORDER — FENTANYL CITRATE (PF) 100 MCG/2ML IJ SOLN: 25.0000 ug | INTRAMUSCULAR | Status: DC | PRN

## 2015-02-18 MED ORDER — FENTANYL CITRATE (PF) 100 MCG/2ML IJ SOLN
INTRAMUSCULAR | Status: DC | PRN
  Administered 2015-02-18 (×2): 25 ug via INTRAVENOUS

## 2015-02-18 MED ORDER — SODIUM CHLORIDE 0.9 % IJ SOLN
INTRAMUSCULAR | Status: AC
  Filled 2015-02-18: qty 50

## 2015-02-18 MED ORDER — ACETAMINOPHEN 325 MG PO TABS: 325.0000 mg | ORAL_TABLET | ORAL | Status: DC | PRN

## 2015-02-18 MED ORDER — IPRATROPIUM-ALBUTEROL 0.5-2.5 (3) MG/3ML IN SOLN
3.0000 mL | Freq: Once | RESPIRATORY_TRACT | Status: AC
  Administered 2015-02-18: 3 mL via RESPIRATORY_TRACT

## 2015-02-18 MED ORDER — LACTATED RINGERS IV SOLN
INTRAVENOUS | Status: DC | PRN
  Administered 2015-02-18: 13:00:00 via INTRAVENOUS

## 2015-02-18 MED ORDER — LIDOCAINE 1 % OPTIME INJ - NO CHARGE
INTRAMUSCULAR | Status: DC | PRN
  Administered 2015-02-18: 30 mL

## 2015-02-18 MED ORDER — GENTAMICIN SULFATE 40 MG/ML IJ SOLN
INTRAMUSCULAR | Status: AC
  Filled 2015-02-18: qty 2

## 2015-02-18 MED ORDER — PHENYLEPHRINE HCL 10 MG/ML IJ SOLN
INTRAMUSCULAR | Status: DC | PRN
  Administered 2015-02-18: 100 ug via INTRAVENOUS

## 2015-02-18 MED ORDER — ONDANSETRON HCL 4 MG/2ML IJ SOLN: 4.0000 mg | Freq: Once | INTRAMUSCULAR | Status: DC | PRN

## 2015-02-18 MED ORDER — CLARITHROMYCIN 250 MG PO TABS
250.0000 mg | ORAL_TABLET | Freq: Two times a day (BID) | ORAL | Status: DC
  Administered 2015-02-18 – 2015-02-19 (×3): 250 mg via ORAL
  Filled 2015-02-18 (×5): qty 1

## 2015-02-18 SURGICAL SUPPLY — 33 items
BAG DECANTER STRL (MISCELLANEOUS) ×3 IMPLANT
BRUSH SCRUB 4% CHG (MISCELLANEOUS) ×3 IMPLANT
CABLE SURG 12 DISP A/V CHANNEL (MISCELLANEOUS) ×3 IMPLANT
CANISTER SUCT 1200ML W/VALVE (MISCELLANEOUS) ×3 IMPLANT
CHLORAPREP W/TINT 26ML (MISCELLANEOUS) ×3 IMPLANT
COVER LIGHT HANDLE STERIS (MISCELLANEOUS) ×6 IMPLANT
COVER MAYO STAND STRL (DRAPES) ×3 IMPLANT
DRAPE C-ARM XRAY 36X54 (DRAPES) ×3 IMPLANT
DRESSING TELFA 4X3 1S ST N-ADH (GAUZE/BANDAGES/DRESSINGS) ×3 IMPLANT
DRSG TEGADERM 4X4.75 (GAUZE/BANDAGES/DRESSINGS) ×3 IMPLANT
GLOVE BIO SURGEON STRL SZ7.5 (GLOVE) ×6 IMPLANT
GLOVE BIO SURGEON STRL SZ8 (GLOVE) ×3 IMPLANT
GOWN STRL REUS W/ TWL LRG LVL3 (GOWN DISPOSABLE) ×1 IMPLANT
GOWN STRL REUS W/ TWL XL LVL3 (GOWN DISPOSABLE) ×1 IMPLANT
GOWN STRL REUS W/TWL LRG LVL3 (GOWN DISPOSABLE) ×2
GOWN STRL REUS W/TWL XL LVL3 (GOWN DISPOSABLE) ×2
IMMOBILIZER SHDR MD LX WHT (SOFTGOODS) ×3 IMPLANT
IMMOBILIZER SHDR XL LX WHT (SOFTGOODS) IMPLANT
INTRO PACEMKR SHEATH II 7FR (MISCELLANEOUS) ×3
INTRODUCER PACEMKR SHTH II 7FR (MISCELLANEOUS) ×1 IMPLANT
IV NS 500ML (IV SOLUTION) ×2
IV NS 500ML BAXH (IV SOLUTION) ×1 IMPLANT
KIT RM TURNOVER STRD PROC AR (KITS) ×3 IMPLANT
LABEL OR SOLS (LABEL) ×3 IMPLANT
LEAD CAPSURE NOVUS 5092-52CM (Lead) ×3 IMPLANT
LEAD CAPSURE NOVUS 5592-45CM (Lead) IMPLANT
MARKER SKIN W/RULER 31145785 (MISCELLANEOUS) ×3 IMPLANT
PACEMAKER ADAPTA SR ADSR01 (Pacemaker) ×1 IMPLANT
PACK PACE INSERTION (MISCELLANEOUS) ×3 IMPLANT
PAD GROUND ADULT SPLIT (MISCELLANEOUS) ×3 IMPLANT
PAD STATPAD (MISCELLANEOUS) ×3 IMPLANT
PPM ADAPTA SR ADSR01 (Pacemaker) ×3 IMPLANT
SUT SILK 0 SH 30 (SUTURE) ×9 IMPLANT

## 2015-02-18 NOTE — Transfer of Care (Signed)
Immediate Anesthesia Transfer of Care Note  Patient: Krista Ortiz  Procedure(s) Performed: Procedure(s): INSERTION PACEMAKER (Left)  Patient Location: PACU  Anesthesia Type:General  Level of Consciousness: awake, alert  and oriented  Airway & Oxygen Therapy: Patient Spontanous Breathing  Post-op Assessment: Report given to RN and Post -op Vital signs reviewed and stable  Post vital signs: Reviewed and stable  Last Vitals:  Filed Vitals:   02/18/15 1428  BP: 167/57  Pulse: 65  Temp: 36.4 C  Resp: 18    Complications: No apparent anesthesia complications

## 2015-02-18 NOTE — Op Note (Signed)
[facility]   02/15/2015 - 02/18/2015                     2:25 PM  PATIENT:  Krista Ortiz    PRE-OPERATIVE DIAGNOSIS:  sick sinus syndrome  POST-OPERATIVE DIAGNOSIS:  Same  PROCEDURE:  INSERTION PACEMAKER  SURGEON:  Falicia Lizotte, MD    ANESTHESIA:     PREOPERATIVE INDICATIONS:  [name] is a  81 y.o. female with a diagnosis of sick sinus syndrome who failed conservative measures and elected for surgical management.    The risks benefits and alternatives were discussed with the patient preoperatively including but not limited to the risks of infection, bleeding, cardiopulmonary complications, the need for revision surgery, among others, and the patient was willing to proceed.   OPERATIVE PROCEDURE: the patient was brought to the operating room in fasting state. Left pectoral region was prepped and draped in the usual sterile manner. Access was obtained by fine-needle aspiration.Atrial lead was positioned into the right ventricular apical site septum under fluoroscopic guidance. After proper thresholds were obtained lead was sutured in place.pacemaker pocket was irrigated by gentamicin solution. Ventricular lead was connected to a single chamber rate responsive pacemaker generator and positioned in the pocket. Pocket was closed with 2-0 and 4-0, respectively. Steri-Strips and a pressure dressing were applied.

## 2015-02-18 NOTE — Anesthesia Preprocedure Evaluation (Signed)
Anesthesia Evaluation  Patient identified by MRN, date of birth, ID band Patient awake    Reviewed: Allergy & Precautions, NPO status , Patient's Chart, lab work & pertinent test results  History of Anesthesia Complications Negative for: history of anesthetic complications  Airway Mallampati: II  TM Distance: >3 FB Neck ROM: Full    Dental no notable dental hx. (+) Upper Dentures, Lower Dentures   Pulmonary COPD COPD inhaler and oxygen dependent, Current Smoker,  o2 at night   Pulmonary exam normal       Cardiovascular Exercise Tolerance: Poor + Peripheral Vascular Disease Normal cardiovascular exam+ dysrhythmias     Neuro/Psych TIAnegative psych ROS   GI/Hepatic negative GI ROS, Neg liver ROS,   Endo/Other  negative endocrine ROS  Renal/GU negative Renal ROS  negative genitourinary   Musculoskeletal negative musculoskeletal ROS (+)   Abdominal   Peds negative pediatric ROS (+)  Hematology negative hematology ROS (+)   Anesthesia Other Findings   Reproductive/Obstetrics negative OB ROS                             Anesthesia Physical Anesthesia Plan  ASA: III  Anesthesia Plan: MAC   Post-op Pain Management:    Induction: Intravenous  Airway Management Planned: Nasal Cannula  Additional Equipment:   Intra-op Plan:   Post-operative Plan:   Informed Consent: I have reviewed the patients History and Physical, chart, labs and discussed the procedure including the risks, benefits and alternatives for the proposed anesthesia with the patient or authorized representative who has indicated his/her understanding and acceptance.     Plan Discussed with: Surgeon and CRNA  Anesthesia Plan Comments:         Anesthesia Quick Evaluation

## 2015-02-18 NOTE — Anesthesia Postprocedure Evaluation (Signed)
  Anesthesia Post-op Note  Patient: Krista Ortiz  Procedure(s) Performed: Procedure(s): INSERTION PACEMAKER (Left)  Anesthesia type:General  Patient location: PACU  Post pain: Pain level controlled  Post assessment: Post-op Vital signs reviewed, Patient's Cardiovascular Status Stable, Respiratory Function Stable, Patent Airway and No signs of Nausea or vomiting  Post vital signs: Reviewed and stable  Last Vitals:  Filed Vitals:   02/18/15 1535  BP: 167/37  Pulse: 70  Temp: 36.4 C  Resp: 16    Level of consciousness: awake, alert  and patient cooperative  Complications: No apparent anesthesia complications

## 2015-02-18 NOTE — Care Management (Signed)
Patient p resents from home.  Physical evaluated prior to patient's pacemaker insertion and recommended home with home 6/10 e health physical therapy.  If patient is able to return home will set up home health physical therapy and nursing.  CM has not been able to speak with patient directly so unable to determine patient's support system. She does present from [facility].  Will reassess 6/14 to see if needs skilled nursing- otherwise will anticipate discharge home with home health.

## 2015-02-18 NOTE — Progress Notes (Signed)
Patient ID: Tamre Cass, female   DOB: 03-12-1924, 81 y.o.   MRN: [id number] [facility] PROGRESS NOTE PCP: Hortencia Pilar, MD  HPI/Subjective: Patient having another pause of 2.9 seconds yesterday morning after I saw her. Patient currently feels okay. Some shortness of breath. Some rib pain.  Objective: Filed Vitals:   02/18/15 0418  BP: 153/49  Pulse: 63  Temp: 97.5 F (36.4 C)  Resp: 18    Intake/Output Summary (Last 24 hours) at 02/18/15 0752 Last data filed at 02/18/15 0734  Gross per 24 hour  Intake    240 ml  Output   2000 ml  Net  -1760 ml   Filed Weights   02/16/15 0509 02/17/15 0429 02/18/15 0418  Weight: 71.351 kg (157 lb 4.8 oz) 72.213 kg (159 lb 3.2 oz) 71.351 kg (157 lb 4.8 oz)    ROS: Review of Systems  Constitutional: Negative for fever and chills.  Eyes: Negative for blurred vision.  Respiratory: Positive for shortness of breath. Negative for cough.   Cardiovascular: Negative for chest pain.  Gastrointestinal: Positive for constipation. Negative for nausea, vomiting, abdominal pain and diarrhea.  Genitourinary: Negative for dysuria.  Musculoskeletal: Negative for joint pain.  Neurological: Negative for dizziness and headaches.   Exam: Physical Exam  HENT:  Nose: No mucosal edema.  Mouth/Throat: No oropharyngeal exudate or posterior oropharyngeal edema.  Eyes: Conjunctivae, EOM and lids are normal. Pupils are equal, round, and reactive to light.  Neck: No JVD present. Carotid bruit is not present. No edema present. No thyroid mass and no thyromegaly present.  Cardiovascular: S1 normal and S2 normal.  Exam reveals no gallop.   No murmur heard. Pulses:      Dorsalis pedis pulses are 2+ on the right side, and 2+ on the left side.  Respiratory: No respiratory distress. She has wheezes in the right lower field and the left lower field. She has no rhonchi. She has no rales.  GI: Soft. Bowel sounds are normal. There is no tenderness.   Musculoskeletal:       Right ankle: She exhibits swelling.       Left ankle: She exhibits swelling.  Lymphadenopathy:    She has no cervical adenopathy.  Neurological: She is alert. No cranial nerve deficit.  Skin: Skin is warm. No rash noted. Nails show no clubbing.  Psychiatric: She has a normal mood and affect.    Data Reviewed: Basic Metabolic Panel:  Recent Labs Lab 02/15/15 1354 02/16/15 0351 02/17/15 0421  NA 139 142 139  K 3.1* 3.1* 3.5  CL 99* 103 107  CO2 30 30 28   GLUCOSE 100* 97 101*  BUN 10 8 9   CREATININE 1.11* 0.96 0.94  CALCIUM 9.1 8.5* 8.4*  MG  --  1.4* 2.0   CBC:  Recent Labs Lab 02/15/15 1354  WBC 6.2  HGB 14.8  HCT 46.7  MCV 83.8  PLT 396   Cardiac Enzymes:  Recent Labs Lab 02/15/15 1354 02/15/15 1825 02/15/15 2339  TROPONINI <0.03 <0.03 <0.03    Scheduled Meds: . aspirin EC  81 mg Oral Daily  . atorvastatin  10 mg Oral Daily  . cefUROXime (ZINACEF)  IV  1.5 g Intravenous 60 min Pre-Op  . hydroxyurea  500 mg Oral Once per day on Mon Wed Fri  . polyethylene glycol  17 g Oral Daily  . potassium chloride  10 mEq Oral Daily  . traMADol  50 mg Oral QPM    Assessment/Plan:  1. Sick  sinus syndrome: Patient had syncope and collapse at home. Patient had a 12 second pause and 2 other pauses greater than 2 seconds on telemetry monitoring. Patient is not on any rate controlling medications. Patient is nothing by mouth for pacemaker placement hopefully around 12 noon today. No contraindications to procedure at this time. 2. Hypokalemia and hypomagnesemia.  Electrolytes replaced. 3. Carotid stenosis seen on ultrasound of the carotids. Will likely need CT angio of the carotids at some point as outpatient.  Would feel more comfortable with patient having pacemaker first. 4. Hyperlipidemia unspecified continue atorvastatin. 5. Polycythemia- on hydroxyurea. 6. Sleep apnea on CPAP at night. 7. Rib fracture closed secondary to syncope.  Code  Status:     Code Status Orders        Start     Ordered   02/15/15 1646  Full code   Continuous     02/15/15 1645     Disposition Plan: Home potentially Tuesday after pacer.  Time spent: 20 minutes  Loletha Grayer  [facility]

## 2015-02-18 NOTE — Progress Notes (Signed)
PT Cancellation Note  Patient Details Name: Coleen Cardiff MRN: [id number] DOB: 1923/12/07   Cancelled Treatment:    Reason Eval/Treat Not Completed: Medical issues which prohibited therapy (see PT cancellation note for further details). Pt with scheduled pacemaker Sx this date. Will hold therapy until next date for re-eval pending medical clearance.  Greggory Stallion, PT, DPT [phone]    Haytham Maher 02/18/2015, 11:45 AM

## 2015-02-19 DIAGNOSIS — I6529 Occlusion and stenosis of unspecified carotid artery: Secondary | ICD-10-CM | POA: Insufficient documentation

## 2015-02-19 MED ORDER — ACETAMINOPHEN 325 MG PO TABS: 650.0000 mg | ORAL_TABLET | Freq: Four times a day (QID) | ORAL | Status: DC | PRN

## 2015-02-19 MED ORDER — CLARITHROMYCIN 250 MG PO TABS: 250.0000 mg | ORAL_TABLET | Freq: Two times a day (BID) | ORAL | Status: DC

## 2015-02-19 NOTE — Discharge Instructions (Addendum)
Pacemaker Implantation, Care After °Refer to this sheet over the next few weeks. These instructions provide you with information on caring for yourself after the procedure. Your health care provider may also give you more specific instructions. Your treatment has been planned according to current medical practices, but problems sometimes occur. Call your health care provider if you have any problems or questions regarding your pacemaker.  °WHAT TO EXPECT AFTER THE PROCEDURE °· You may feel pain. Some pain is normal. It may last a few days. °· A slight bump may be seen over the skin where the device was placed. Sometimes, it is possible to feel the device under the skin. This is normal. °· In the months and years afterward, your health care provider will check the device, the leads, and the battery every few months. Eventually, when the battery is low, the device will be replaced. °HOME CARE INSTRUCTIONS °Medicines °· Take medicines only as directed by your health care provider. °· If you were prescribed an antibiotic medicine, finish it all even if you start to feel better. °· Do not take any other medicines without asking your health care provider first. Some medicines, including certain painkillers, can cause bleeding in your stomach after surgery. °Wound Care °· Do not remove the bandage on your chest until directed to do so by your health care provider. °· After your bandage is removed, you may see pieces of tape called skin adhesive strips over the area where the cut was made (incision site). Let them fall off on their own. °· Check the incision site every day to make sure it is not infected, bleeding, or starting to pull apart. °· Do not use lotions or ointments near the incision site unless directed to do so. °· Keep the incision area clean and dry for 2-3 days after the procedure or as directed by your health care provider. It takes several weeks for the incision site to completely heal. °· Do not take  baths, swim, or use a hot tub until your health care provider approves. °Activities °· Try to walk a little every day. Exercising is important after this procedure. It is also important to use your shoulder on the side of the pacemaker in daily tasks that do not require exaggerated motion. °· Avoid sudden jerking, pulling, or chopping movements that pull your upper arm far away from your body for at least 6 weeks. °· Do not lift your upper arm above your shoulders for at least 6 weeks. This means no tennis, golf, or swimming for this period of time. If you sleep with the arm above your head, use a restraint to prevent this from happening as you sleep. °· You may go back to work when your health care provider says it is okay. Check with your health care provider before you start to drive or play sports. °Other Instructions °· Follow diet instructions if they were provided. You should be able to eat what you usually do right away, but you may need to limit your salt intake. °· Weigh yourself every day. If you suddenly gain weight, fluid may be building up in your body. °· Always carry your pacemaker identification card with you. The card should list the implant date, device model, and manufacturer. Consider wearing a medical alert bracelet or necklace. °· Tell all health care providers that you have a pacemaker. This may prevent them from giving you a magnetic resource imaging scan (MRI) because of the strong magnets used during that test. °·   If you must pass through a metal detector, quickly walk through it. Do not stop under the detector or stand near it.  Avoid places or objects with a strong electric or magnetic field, including:  Engineer, maintenance. When at the airport, let officials know you have a pacemaker. Your ID card will let you be checked in a way that is safe for you and that will not damage your pacemaker. Also, do not let a security person wave a magnetic wand near your pacemaker. That can make  it stop working.  Power plants.  Large electrical generators.  Radiofrequency transmission towers, such as cell phone and radio towers.  Do not use amateur (ham) radio equipment or electric (arc) welding torches. Some devices are safe to use if held at least 1 foot from your pacemaker. These include power tools, lawn mowers, and speakers. If you are unsure of whether something is safe to use, ask your health care provider.  You may safely use electric blankets, heating pads, computers, and microwave ovens.  When using your cell phone, hold it to the ear opposite the pacemaker. Do not leave your cell phone in a pocket over the pacemaker.  Keep all follow-up visits as directed by your health care provider. This is how your health care provider makes sure your chest is healing the way it should. Ask your health care provider when you should come back to have your stitches or staples taken out.  Have your pacemaker checked every 3-6 months or as directed by your health care provider. Most pacemakers last for 4-8 years before a new one is needed. SEEK MEDICAL CARE IF:  You gain weight suddenly.  Your legs or feet swell more than they have before.  It feels like your heart is fluttering or skipping beats (heart palpitations).  You have a fever. SEEK IMMEDIATE MEDICAL CARE IF:  You have chest pain.  You feel more short of breath than you have felt before.  You feel more light-headed than you have felt before.  You have problems with your incision site, such as swelling or bleeding, or it starts to open up.  You have drainage, redness, swelling, or pain at your incision site. Document Released: 03/13/2005 Document Revised: 01/08/2014 Document Reviewed: 12/25/2011 [facility] Patient Information 2015 [place], [place]. This information is not intended to replace advice given to you by your health care provider. Make sure you discuss any questions you have with your health care  provider.   [place] [facility]  [phone]

## 2015-02-19 NOTE — Discharge Summary (Signed)
[place] [facility] [place] NAME: Krista Ortiz    MR#:  [id number]  DATE OF BIRTH:  November 21, 1923  DATE OF ADMISSION:  02/15/2015 ADMITTING PHYSICIAN: Henreitta Leber, MD  DATE OF DISCHARGE: 02/19/2015  PRIMARY CARE PHYSICIAN: Hortencia Pilar, MD    ADMISSION DIAGNOSIS:  Syncope and collapse [R55] Fall [W19.XXXA] Rib fracture, right, closed, initial encounter [S22.31XA]  DISCHARGE DIAGNOSIS:  Active Problems:   Syncope and collapse   Sick sinus syndrome   SECONDARY DIAGNOSIS:   Past Medical History  Diagnosis Date  . Chronic back pain   . Hyperlipidemia   . Polycythemia   . COPD (chronic obstructive pulmonary disease)   . TIA (transient ischemic attack)   . PVD (peripheral vascular disease)   . Port-a-cath in place     HOSPITAL COURSE:   1. Sick sinus syndrome. Patient presented with syncope and collapse. On telemetry monitoring she had a 12 second pause and 2 other pauses greater than 2 seconds. She had a pacemaker placement by Dr. Lorinda Creed on 02/18/2015. And is stable for discharge home. Dr. Lorinda Creed wanted prophylactic antibiotics. 2. Bilateral carotid stenosis seen on ultrasound of the carotids. I was hesitant on doing a CT angiogram of the carotids prior to a pacemaker placement. Patient deferred this testing at this time and once to do this as outpatient. 3. Hypokalemia and hypomagnesemia. These electrolytes were replaced during the hospital course. 4. Closed fracture of the rib on the right side- patient deferred pain medication at this time. 5. History of TIA- her Plavix was held during the hospital course for pacemaker placement and then placed on aspirin. She will go back on her Plavix as outpatient. 6. Polycythemia- she is on hydroxyurea. 7. Hyperlipidemia unspecified continue atorvastatin.  DISCHARGE CONDITIONS:   Satisfactory  CONSULTS OBTAINED:  Treatment Team:  Yolonda Kida, MD  DRUG ALLERGIES:    Allergies  Allergen Reactions  . Citalopram Other (See Comments)    Reaction:  Fatigue   . Pneumovax [Pneumococcal Polysaccharide Vaccine] Other (See Comments)    Reaction:  Unknown   . Requip [Ropinirole Hcl] Other (See Comments)    Reaction:  Dry mouth   . Penicillins Rash    DISCHARGE MEDICATIONS:   Current Discharge Medication List    START taking these medications   Details  acetaminophen (TYLENOL) 325 MG tablet Take 2 tablets (650 mg total) by mouth every 6 (six) hours as needed for mild pain (or Fever >/= 101).    clarithromycin (BIAXIN) 250 MG tablet Take 1 tablet (250 mg total) by mouth 2 (two) times daily. Qty: 18 tablet, Refills: 0      CONTINUE these medications which have NOT CHANGED   Details  atorvastatin (LIPITOR) 10 MG tablet Take 10 mg by mouth daily.    clopidogrel (PLAVIX) 75 MG tablet Take 75 mg by mouth daily.    furosemide (LASIX) 20 MG tablet Take 20 mg by mouth daily.    hydroxyurea (HYDREA) 500 MG capsule Take 500 mg by mouth 3 (three) times a week. Pt takes on Monday, Wednesday, and Friday.    potassium chloride (K-DUR) 10 MEQ tablet Take 10 mEq by mouth daily.    traMADol (ULTRAM) 50 MG tablet Take 50 mg by mouth every evening.         DISCHARGE INSTRUCTIONS:   Follow-up with Dr. Hortencia Pilar 1-2 weeks. Follow-up with Dr. Nehemiah Massed one week. Recommend outpatient CT in June of the carotids to determine the amount  of stenosis.  If you experience worsening of your admission symptoms, develop shortness of breath, life threatening emergency, suicidal or homicidal thoughts you must seek medical attention immediately by calling 911 or calling your MD immediately  if symptoms less severe.  You Must read complete instructions/literature along with all the possible adverse reactions/side effects for all the Medicines you take and that have been prescribed to you. Take any new Medicines after you have completely understood and accept all the  possible adverse reactions/side effects.   Please note  You were cared for by a hospitalist during your hospital stay. If you have any questions about your discharge medications or the care you received while you were in the hospital after you are discharged, you can call the unit and asked to speak with the hospitalist on call if the hospitalist that took care of you is not available. Once you are discharged, your primary care physician will handle any further medical issues. Please note that NO REFILLS for any discharge medications will be authorized once you are discharged, as it is imperative that you return to your primary care physician (or establish a relationship with a primary care physician if you do not have one) for your aftercare needs so that they can reassess your need for medications and monitor your lab values.    Today   CHIEF COMPLAINT:   Chief Complaint  Patient presents with  . Loss of Consciousness  . Back Pain  . Laceration    HISTORY OF PRESENT ILLNESS:  Krista Ortiz  is a 81 y.o. female with a known history of polycythemia TIA hypertension presented with syncope and collapse.   VITAL SIGNS:  Blood pressure 131/50, pulse 78, temperature 98.2 F (36.8 C), temperature source Oral, resp. rate 18, height 5\' 5"  (1.651 m), weight 72.303 kg (159 lb 6.4 oz), SpO2 94 %.  I/O:    Intake/Output Summary (Last 24 hours) at 02/19/15 0806 Last data filed at 02/19/15 0739  Gross per 24 hour  Intake    570 ml  Output   1100 ml  Net   -530 ml    PHYSICAL EXAMINATION:  GENERAL:  81 y.o.-year-old patient lying in the bed with no acute distress.  EYES: Pupils equal, round, reactive to light and accommodation. No scleral icterus. Extraocular muscles intact.  HEENT: Head atraumatic, normocephalic. Oropharynx and nasopharynx clear.  NECK:  Supple, no jugular venous distention. No thyroid enlargement, no tenderness.  LUNGS: Normal breath sounds bilaterally, no wheezing,  rales,rhonchi or crepitation. No use of accessory muscles of respiration.  CARDIOVASCULAR: S1, S2 normal. No murmurs, rubs, or gallops.  ABDOMEN: Soft, non-tender, non-distended. Bowel sounds present. No organomegaly or mass.  EXTREMITIES: No pedal edema, cyanosis, or clubbing.  NEUROLOGIC: Cranial nerves II through XII are intact. Muscle strength 5/5 in all extremities. Sensation intact. Gait not checked.  PSYCHIATRIC: The patient is alert and oriented x 3.  SKIN: No obvious rash, lesion, or ulcer.  Port-A-Cath right chest. Bandage seen over pacemaker placement left chest.   Management plans discussed with the patient, and she is  in agreement.  CODE STATUS:     Code Status Orders        Start     Ordered   02/18/15 1556  Full code   Continuous     02/18/15 1555      TOTAL TIME TAKING CARE OF THIS PATIENT: 35 minutes  and greater than 50% of the time spent with counseling and coordination of care.  Loletha Grayer M.D on 02/19/2015 at 8:06 AM  Between 7am to 6pm - Pager - [phone].  After 6pm go to [email or web address] - password [email or web address] [facility]  Office  [phone]  CC: Primary care physician; Hortencia Pilar, MD

## 2015-02-19 NOTE — Progress Notes (Signed)
Pt discharged home, pacer site clean and dry, steristrips in place, pt's family given f/u appts, med list, scrip location.  Port de-accessed, site covered, no bleeding.

## 2015-02-19 NOTE — Progress Notes (Signed)
Physical Therapy Treatment Patient Details Name: Krista Ortiz MRN: [id number] DOB: [birth year] Today's Date: 2015-03-02    History of Present Illness Pt now with pacemaker Sx on 02/18/15.     PT Comments    Pt is making good progress towards goals. Pt demonstrates safe ambulation in room with rw. Pt reports no steps to enter/exit house. No SOB symptoms noted.  Follow Up Recommendations  Home health PT     Equipment Recommendations  None recommended by PT    Recommendations for Other Services       Precautions / Restrictions Precautions Precautions: Other (comment);Fall Restrictions Weight Bearing Restrictions: No    Mobility  Bed Mobility Overal bed mobility: Independent Bed Mobility: Supine to Sit     Supine to sit: Supervision     General bed mobility comments: supervision demonstrated for bed mobility. Safe technique performed.  Transfers Overall transfer level: Needs assistance Equipment used: Rolling walker (2 wheeled) Transfers: Sit to/from Stand           General transfer comment: sit<>Stand with rw and cga. Pt demonstrates safe technique.  Ambulation/Gait Ambulation/Gait assistance: Min guard Ambulation Distance (Feet): 50 Feet Assistive device: Rolling walker (2 wheeled)       General Gait Details: ambulated in room with safe technique and cga. Pt refused further ambulation secondary to wanting breakfast tray. Reciprocal gait pattern noted. Safe technique performed.   Stairs            Wheelchair Mobility    Modified Rankin (Stroke Patients Only)       Balance                                    Cognition Arousal/Alertness: Awake/alert Behavior During Therapy: WFL for tasks assessed/performed Overall Cognitive Status: Within Functional Limits for tasks assessed                      Exercises      General Comments        Pertinent Vitals/Pain Pain Assessment: No/denies pain    Home Living                       Prior Function            PT Goals (current goals can now be found in the care plan section) Acute Rehab PT Goals Patient Stated Goal: to be healthy and safe PT Goal Formulation: With patient Time For Goal Achievement: 03/02/15 Potential to Achieve Goals: Good Progress towards PT goals: Progressing toward goals    Frequency  Min 2X/week    PT Plan Current plan remains appropriate    Co-evaluation             End of Session Equipment Utilized During Treatment: Gait belt Activity Tolerance: Patient tolerated treatment well Patient left: in chair;with call bell/phone within reach;with chair alarm set     Time: 2297-9892 PT Time Calculation (min) (ACUTE ONLY): 10 min  Charges:  $Gait Training: 8-22 mins                    G Codes:      Krista Ortiz 02-Mar-2015, 3:37 PM  Greggory Stallion, PT, DPT [phone]

## 2015-02-21 DIAGNOSIS — T887XXA Unspecified adverse effect of drug or medicament, initial encounter: Secondary | ICD-10-CM | POA: Insufficient documentation

## 2015-02-21 DIAGNOSIS — T50A95A Adverse effect of other bacterial vaccines, initial encounter: Secondary | ICD-10-CM | POA: Insufficient documentation

## 2015-02-28 ENCOUNTER — Other Ambulatory Visit: Payer: Self-pay

## 2015-02-28 DIAGNOSIS — R55 Syncope and collapse: Secondary | ICD-10-CM

## 2015-03-01 ENCOUNTER — Inpatient Hospital Stay: Payer: Medicare Other

## 2015-03-01 ENCOUNTER — Inpatient Hospital Stay: Payer: Medicare Other | Attending: Family Medicine

## 2015-03-01 DIAGNOSIS — R55 Syncope and collapse: Secondary | ICD-10-CM

## 2015-03-01 DIAGNOSIS — D45 Polycythemia vera: Secondary | ICD-10-CM | POA: Diagnosis present

## 2015-03-01 LAB — CBC WITH DIFFERENTIAL/PLATELET
BASOS PCT: 1 %
Basophils Absolute: 0.1 10*3/uL (ref 0–0.1)
Eosinophils Absolute: 0.1 10*3/uL (ref 0–0.7)
Eosinophils Relative: 2 %
HEMATOCRIT: 41.7 % (ref 35.0–47.0)
HEMOGLOBIN: 13.4 g/dL (ref 12.0–16.0)
Lymphocytes Relative: 25 %
Lymphs Abs: 1.2 10*3/uL (ref 1.0–3.6)
MCH: 27.3 pg (ref 26.0–34.0)
MCHC: 32.1 g/dL (ref 32.0–36.0)
MCV: 85 fL (ref 80.0–100.0)
MONO ABS: 0.6 10*3/uL (ref 0.2–0.9)
Monocytes Relative: 12 %
Neutro Abs: 3 10*3/uL (ref 1.4–6.5)
Neutrophils Relative %: 60 %
Platelets: 343 10*3/uL (ref 150–440)
RBC: 4.91 MIL/uL (ref 3.80–5.20)
RDW: 20.1 % — ABNORMAL HIGH (ref 11.5–14.5)
WBC: 5 10*3/uL (ref 3.6–11.0)

## 2015-03-01 MED ORDER — HEPARIN SOD (PORK) LOCK FLUSH 100 UNIT/ML IV SOLN
500.0000 [IU] | Freq: Once | INTRAVENOUS | Status: AC
  Administered 2015-03-01: 500 [IU] via INTRAVENOUS

## 2015-03-01 MED ORDER — SODIUM CHLORIDE 0.9 % IJ SOLN
10.0000 mL | INTRAMUSCULAR | Status: DC | PRN
  Administered 2015-03-01: 10 mL
  Filled 2015-03-01: qty 10

## 2015-03-13 ENCOUNTER — Other Ambulatory Visit: Payer: Self-pay | Admitting: Family Medicine

## 2015-03-13 DIAGNOSIS — I6523 Occlusion and stenosis of bilateral carotid arteries: Secondary | ICD-10-CM

## 2015-03-19 ENCOUNTER — Ambulatory Visit: Payer: Medicare Other

## 2015-03-21 ENCOUNTER — Ambulatory Visit
Admission: RE | Admit: 2015-03-21 | Discharge: 2015-03-21 | Disposition: A | Discharge status: Home / Self Care | Payer: Medicare Other | Source: Ambulatory Visit | Attending: Family Medicine | Admitting: Family Medicine

## 2015-03-21 DIAGNOSIS — I6523 Occlusion and stenosis of bilateral carotid arteries: Secondary | ICD-10-CM | POA: Insufficient documentation

## 2015-03-21 DIAGNOSIS — M4184 Other forms of scoliosis, thoracic region: Secondary | ICD-10-CM | POA: Diagnosis not present

## 2015-03-21 DIAGNOSIS — Z950 Presence of cardiac pacemaker: Secondary | ICD-10-CM | POA: Diagnosis not present

## 2015-03-21 MED ORDER — IOHEXOL 300 MG/ML  SOLN
75.0000 mL | Freq: Once | INTRAMUSCULAR | Status: AC | PRN
  Administered 2015-03-21: 60 mL via INTRAVENOUS

## 2015-03-29 ENCOUNTER — Inpatient Hospital Stay: Payer: Medicare Other | Attending: Family Medicine

## 2015-03-29 ENCOUNTER — Inpatient Hospital Stay: Payer: Medicare Other

## 2015-03-29 DIAGNOSIS — D45 Polycythemia vera: Secondary | ICD-10-CM | POA: Insufficient documentation

## 2015-03-29 LAB — CBC WITH DIFFERENTIAL/PLATELET
BASOS ABS: 0.1 10*3/uL (ref 0–0.1)
Basophils Relative: 1 %
EOS ABS: 0.1 10*3/uL (ref 0–0.7)
Eosinophils Relative: 2 %
HCT: 45.8 % (ref 35.0–47.0)
Hemoglobin: 14.6 g/dL (ref 12.0–16.0)
Lymphocytes Relative: 29 %
Lymphs Abs: 1.6 10*3/uL (ref 1.0–3.6)
MCH: 27.5 pg (ref 26.0–34.0)
MCHC: 32 g/dL (ref 32.0–36.0)
MCV: 86 fL (ref 80.0–100.0)
Monocytes Absolute: 0.7 10*3/uL (ref 0.2–0.9)
Monocytes Relative: 13 %
NEUTROS PCT: 55 %
Neutro Abs: 3 10*3/uL (ref 1.4–6.5)
Platelets: 353 10*3/uL (ref 150–440)
RBC: 5.32 MIL/uL — ABNORMAL HIGH (ref 3.80–5.20)
RDW: 17.1 % — AB (ref 11.5–14.5)
WBC: 5.6 10*3/uL (ref 3.6–11.0)

## 2015-03-29 MED ORDER — SODIUM CHLORIDE 0.9 % IJ SOLN
10.0000 mL | Freq: Once | INTRAMUSCULAR | Status: AC
  Administered 2015-03-29: 10 mL via INTRAVENOUS
  Filled 2015-03-29: qty 10

## 2015-03-29 MED ORDER — HEPARIN SOD (PORK) LOCK FLUSH 100 UNIT/ML IV SOLN
500.0000 [IU] | Freq: Once | INTRAVENOUS | Status: AC
  Administered 2015-03-29: 500 [IU] via INTRAVENOUS
  Filled 2015-03-29: qty 5

## 2015-03-29 NOTE — Progress Notes (Signed)
   03/29/15 1300  Clinical Encounter Type  Visited With Patient and family together  Visit Type Initial  Provided pastoral presence and support to patient and family member in cancer center. [place] [phone]

## 2015-04-26 ENCOUNTER — Encounter: Payer: Self-pay | Admitting: Family Medicine

## 2015-04-26 ENCOUNTER — Inpatient Hospital Stay: Payer: Medicare Other

## 2015-04-26 ENCOUNTER — Inpatient Hospital Stay: Payer: Medicare Other | Attending: Family Medicine

## 2015-04-26 ENCOUNTER — Inpatient Hospital Stay (HOSPITAL_BASED_OUTPATIENT_CLINIC_OR_DEPARTMENT_OTHER): Payer: Medicare Other | Admitting: Family Medicine

## 2015-04-26 VITALS — BP 101/66 | HR 106 | Temp 99.1°F | Resp 18 | Wt 153.9 lb

## 2015-04-26 VITALS — BP 104/66 | HR 77 | Resp 18

## 2015-04-26 DIAGNOSIS — D751 Secondary polycythemia: Secondary | ICD-10-CM

## 2015-04-26 DIAGNOSIS — F17210 Nicotine dependence, cigarettes, uncomplicated: Secondary | ICD-10-CM | POA: Insufficient documentation

## 2015-04-26 DIAGNOSIS — J449 Chronic obstructive pulmonary disease, unspecified: Secondary | ICD-10-CM

## 2015-04-26 DIAGNOSIS — I739 Peripheral vascular disease, unspecified: Secondary | ICD-10-CM | POA: Insufficient documentation

## 2015-04-26 DIAGNOSIS — D45 Polycythemia vera: Secondary | ICD-10-CM

## 2015-04-26 DIAGNOSIS — Z950 Presence of cardiac pacemaker: Secondary | ICD-10-CM | POA: Diagnosis not present

## 2015-04-26 DIAGNOSIS — R531 Weakness: Secondary | ICD-10-CM

## 2015-04-26 DIAGNOSIS — D473 Essential (hemorrhagic) thrombocythemia: Secondary | ICD-10-CM

## 2015-04-26 DIAGNOSIS — Z8673 Personal history of transient ischemic attack (TIA), and cerebral infarction without residual deficits: Secondary | ICD-10-CM | POA: Insufficient documentation

## 2015-04-26 DIAGNOSIS — Z79899 Other long term (current) drug therapy: Secondary | ICD-10-CM | POA: Diagnosis not present

## 2015-04-26 DIAGNOSIS — E785 Hyperlipidemia, unspecified: Secondary | ICD-10-CM | POA: Insufficient documentation

## 2015-04-26 DIAGNOSIS — D75839 Thrombocytosis, unspecified: Secondary | ICD-10-CM

## 2015-04-26 HISTORY — DX: Secondary polycythemia: D75.1

## 2015-04-26 LAB — CBC WITH DIFFERENTIAL/PLATELET
BASOS PCT: 1 %
Basophils Absolute: 0.1 10*3/uL (ref 0–0.1)
Eosinophils Absolute: 0 10*3/uL (ref 0–0.7)
Eosinophils Relative: 0 %
HEMATOCRIT: 44.2 % (ref 35.0–47.0)
Hemoglobin: 14.7 g/dL (ref 12.0–16.0)
Lymphocytes Relative: 17 %
Lymphs Abs: 1.6 10*3/uL (ref 1.0–3.6)
MCH: 27.9 pg (ref 26.0–34.0)
MCHC: 33.2 g/dL (ref 32.0–36.0)
MCV: 84 fL (ref 80.0–100.0)
Monocytes Absolute: 1.1 10*3/uL — ABNORMAL HIGH (ref 0.2–0.9)
Monocytes Relative: 12 %
NEUTROS ABS: 6.7 10*3/uL — AB (ref 1.4–6.5)
NEUTROS PCT: 70 %
Platelets: 379 10*3/uL (ref 150–440)
RBC: 5.26 MIL/uL — ABNORMAL HIGH (ref 3.80–5.20)
RDW: 17.2 % — AB (ref 11.5–14.5)
WBC: 9.5 10*3/uL (ref 3.6–11.0)

## 2015-04-26 MED ORDER — TRAMADOL HCL 50 MG PO TABS: 50.0000 mg | ORAL_TABLET | Freq: Every evening | ORAL | Status: DC

## 2015-04-26 MED ORDER — SODIUM CHLORIDE 0.9 % IJ SOLN
10.0000 mL | INTRAMUSCULAR | Status: DC | PRN
  Administered 2015-04-26: 10 mL via INTRAVENOUS
  Filled 2015-04-26: qty 10

## 2015-04-26 MED ORDER — HEPARIN SOD (PORK) LOCK FLUSH 100 UNIT/ML IV SOLN
500.0000 [IU] | Freq: Once | INTRAVENOUS | Status: DC
  Filled 2015-04-26: qty 5

## 2015-04-26 NOTE — Progress Notes (Signed)
[place]  Telephone:([phone]) [phone]  Fax:(336) [phone]     Krista Ortiz DOB: 1923-11-07  MR#: [id number]  MHD#:[id number]  Patient Care Team: Hortencia Pilar, MD as PCP - General (Family Medicine)  CHIEF COMPLAINT:  Chief Complaint  Patient presents with  . Follow-up    polycythemia, thrombocytosis    INTERVAL HISTORY:  Patient is here for continued follow-up and treatment consideration regarding polycythemia and thrombocytosis. Patient is currently taking Hydrea 500 mg 3 times per week on Monday, Wednesday, and Friday. Patient is very upset today that she has not been seen by a physician since April 2016. When asked how she was feeling she would only answer "fine".  REVIEW OF SYSTEMS:   Review of Systems  Constitutional: Negative for fever, chills, weight loss, malaise/fatigue and diaphoresis.  HENT: Negative for congestion, ear discharge, ear pain, hearing loss, nosebleeds, sore throat and tinnitus.   Eyes: Negative for blurred vision, double vision, photophobia, pain, discharge and redness.  Respiratory: Negative for cough, hemoptysis, sputum production, shortness of breath, wheezing and stridor.   Cardiovascular: Negative for chest pain, palpitations, orthopnea, claudication, leg swelling and PND.  Gastrointestinal: Negative for heartburn, nausea, vomiting, abdominal pain, diarrhea, constipation, blood in stool and melena.  Genitourinary: Negative.   Musculoskeletal: Negative.   Skin: Negative.   Neurological: Positive for weakness. Negative for dizziness, tingling, focal weakness, seizures and headaches.  Endo/Heme/Allergies: Does not bruise/bleed easily.  Psychiatric/Behavioral: Negative for depression. The patient is not nervous/anxious and does not have insomnia.     As per HPI. Otherwise, a complete review of systems is negatve.   PAST MEDICAL HISTORY: Past Medical History  Diagnosis Date  . Chronic back pain   . Hyperlipidemia   .  Polycythemia   . COPD (chronic obstructive pulmonary disease)   . TIA (transient ischemic attack)   . PVD (peripheral vascular disease)   . Port-a-cath in place   . Polycythemia, secondary 04/26/2015    PAST SURGICAL HISTORY: Past Surgical History  Procedure Laterality Date  . Back surgery    . Abdominal hysterectomy    . Tonsillectomy    . Pacemaker insertion Left 02/18/2015    Procedure: INSERTION PACEMAKER;  Surgeon: Isaias Cowman, MD;  Location: [facility] ORS;  Service: Cardiovascular;  Laterality: Left;    FAMILY HISTORY Family History  Problem Relation Age of Onset  . CVA Sister   . Heart attack Brother   . Other Mother     Old age  . Other Father     Old age    GYNECOLOGIC HISTORY:  No LMP recorded (lmp unknown). Patient has had a hysterectomy.     ADVANCED DIRECTIVES:    HEALTH MAINTENANCE: Social History  Substance Use Topics  . Smoking status: Current Every Day Smoker -- 0.50 packs/day    Types: Cigarettes  . Smokeless tobacco: Never Used  . Alcohol Use: No     Colonoscopy:  PAP:  Bone density:  Lipid panel:  Allergies  Allergen Reactions  . Citalopram Other (See Comments)    Reaction:  Fatigue   . Pneumovax [Pneumococcal Polysaccharide Vaccine] Other (See Comments)    Reaction:  Unknown   . Requip [Ropinirole Hcl] Other (See Comments)    Reaction:  Dry mouth   . Penicillins Rash    Current Outpatient Prescriptions  Medication Sig Dispense Refill  . acetaminophen (TYLENOL) 325 MG tablet Take 2 tablets (650 mg total) by mouth every 6 (six) hours as needed for mild pain (or Fever >/= 101).    Marland Kitchen  atorvastatin (LIPITOR) 10 MG tablet Take 10 mg by mouth daily.    . clopidogrel (PLAVIX) 75 MG tablet Take 75 mg by mouth daily.    . furosemide (LASIX) 20 MG tablet Take 20 mg by mouth daily.    . hydroxyurea (HYDREA) 500 MG capsule Take 500 mg by mouth 3 (three) times a week. Pt takes on Monday, Wednesday, and Friday.    . potassium chloride (K-DUR)  10 MEQ tablet Take 10 mEq by mouth daily.    . traMADol (ULTRAM) 50 MG tablet Take 1 tablet (50 mg total) by mouth every evening. 30 tablet 0   No current facility-administered medications for this visit.   Facility-Administered Medications Ordered in Other Visits  Medication Dose Route Frequency Provider Last Rate Last Dose  . heparin lock flush 100 unit/mL  500 Units Intravenous Once Evlyn Kanner, NP      . sodium chloride 0.9 % injection 10 mL  10 mL Intravenous PRN Evlyn Kanner, NP   10 mL at 04/26/15 1315    OBJECTIVE: BP 101/66 mmHg  Pulse 106  Temp(Src) 99.1 F (37.3 C) (Tympanic)  Resp 18  Wt 153 lb 14.1 oz (69.8 kg)  LMP  (LMP Unknown)   Body mass index is 25.61 kg/(m^2).    ECOG FS:1 - Symptomatic but completely ambulatory  General: Well-developed, well-nourished, no acute distress. Eyes: Pink conjunctiva, anicteric sclera. HEENT: Normocephalic, moist mucous membranes, clear oropharnyx. Lungs: Patient refused auscultation Heart: Patient refused auscultation Musculoskeletal: No edema, cyanosis, or clubbing. Neuro: Alert, answering all questions appropriately. Cranial nerves grossly intact. Skin: No rashes or petechiae noted. Psych: Normal affect, angry    LAB RESULTS:  Infusion on 04/26/2015  Component Date Value Ref Range Status  . WBC 04/26/2015 9.5  3.6 - 11.0 K/uL Final  . RBC 04/26/2015 5.26* 3.80 - 5.20 MIL/uL Final  . Hemoglobin 04/26/2015 14.7  12.0 - 16.0 g/dL Final  . HCT 04/26/2015 44.2  35.0 - 47.0 % Final  . MCV 04/26/2015 84.0  80.0 - 100.0 fL Final  . MCH 04/26/2015 27.9  26.0 - 34.0 pg Final  . MCHC 04/26/2015 33.2  32.0 - 36.0 g/dL Final  . RDW 04/26/2015 17.2* 11.5 - 14.5 % Final  . Platelets 04/26/2015 379  150 - 440 K/uL Final  . Neutrophils Relative % 04/26/2015 70   Final  . Neutro Abs 04/26/2015 6.7* 1.4 - 6.5 K/uL Final  . Lymphocytes Relative 04/26/2015 17   Final  . Lymphs Abs 04/26/2015 1.6  1.0 - 3.6 K/uL Final  . Monocytes  Relative 04/26/2015 12   Final  . Monocytes Absolute 04/26/2015 1.1* 0.2 - 0.9 K/uL Final  . Eosinophils Relative 04/26/2015 0   Final  . Eosinophils Absolute 04/26/2015 0.0  0 - 0.7 K/uL Final  . Basophils Relative 04/26/2015 1   Final  . Basophils Absolute 04/26/2015 0.1  0 - 0.1 K/uL Final    STUDIES: No results found.  ASSESSMENT:  Polycythemia Thrombocytosis  PLAN:   1. Polycythemia vera. Patient had a previously established goal hematocrit of less than 44 by Dr. Inez Pilgrim. She would typically receive a 300 ML phlebotomy if hematocrit greater than 44, today she is 44.2. We will perform a 200 ML phlebotomy as she is just over her goal hematocrit. 2. Thrombocytosis. Patient is currently on Hydrea 500 mg 3 times per week, Monday, Wednesday, and Friday. No need to alter dose at this time. We'll continue to monitor.  We will have patient return  in 4 weeks for lab and possible phlebotomy and again in 8 weeks to see me provider, labs and possible phlebotomy.  Patient expressed understanding and was in agreement with this plan. She also understands that She can call clinic at any time with any questions, concerns, or complaints.   Dr. Oliva Bustard was available for consultation and review of plan of care for this patient.   Evlyn Kanner, NP   04/26/2015 2:43 PM

## 2015-05-24 ENCOUNTER — Ambulatory Visit: Payer: Medicare Other

## 2015-05-24 ENCOUNTER — Inpatient Hospital Stay: Payer: Medicare Other | Attending: Family Medicine

## 2015-05-24 ENCOUNTER — Inpatient Hospital Stay: Payer: Medicare Other

## 2015-05-24 ENCOUNTER — Other Ambulatory Visit: Payer: Medicare Other

## 2015-05-24 VITALS — BP 110/62 | HR 70 | Resp 18

## 2015-05-24 DIAGNOSIS — D45 Polycythemia vera: Secondary | ICD-10-CM | POA: Diagnosis not present

## 2015-05-24 DIAGNOSIS — D473 Essential (hemorrhagic) thrombocythemia: Secondary | ICD-10-CM

## 2015-05-24 DIAGNOSIS — D751 Secondary polycythemia: Secondary | ICD-10-CM

## 2015-05-24 DIAGNOSIS — D75839 Thrombocytosis, unspecified: Secondary | ICD-10-CM

## 2015-05-24 LAB — CBC WITH DIFFERENTIAL/PLATELET
BASOS ABS: 0.1 10*3/uL (ref 0–0.1)
Basophils Relative: 1 %
EOS ABS: 0.1 10*3/uL (ref 0–0.7)
Eosinophils Relative: 1 %
HCT: 44.2 % (ref 35.0–47.0)
HEMOGLOBIN: 14.4 g/dL (ref 12.0–16.0)
LYMPHS ABS: 1.5 10*3/uL (ref 1.0–3.6)
Lymphocytes Relative: 25 %
MCH: 27.9 pg (ref 26.0–34.0)
MCHC: 32.6 g/dL (ref 32.0–36.0)
MCV: 85.5 fL (ref 80.0–100.0)
Monocytes Absolute: 0.7 10*3/uL (ref 0.2–0.9)
Monocytes Relative: 11 %
NEUTROS PCT: 62 %
Neutro Abs: 3.8 10*3/uL (ref 1.4–6.5)
Platelets: 378 10*3/uL (ref 150–440)
RBC: 5.17 MIL/uL (ref 3.80–5.20)
RDW: 18 % — ABNORMAL HIGH (ref 11.5–14.5)
WBC: 6.2 10*3/uL (ref 3.6–11.0)

## 2015-05-24 MED ORDER — SODIUM CHLORIDE 0.9 % IJ SOLN
10.0000 mL | INTRAMUSCULAR | Status: DC | PRN
  Administered 2015-05-24 (×2): 10 mL via INTRAVENOUS
  Filled 2015-05-24: qty 10

## 2015-05-24 MED ORDER — HEPARIN SOD (PORK) LOCK FLUSH 100 UNIT/ML IV SOLN
500.0000 [IU] | Freq: Once | INTRAVENOUS | Status: AC
  Administered 2015-05-24: 500 [IU] via INTRAVENOUS
  Filled 2015-05-24: qty 5

## 2015-06-12 DIAGNOSIS — G8929 Other chronic pain: Secondary | ICD-10-CM | POA: Insufficient documentation

## 2015-06-12 DIAGNOSIS — M545 Low back pain, unspecified: Secondary | ICD-10-CM | POA: Insufficient documentation

## 2015-06-12 DIAGNOSIS — R7989 Other specified abnormal findings of blood chemistry: Secondary | ICD-10-CM | POA: Insufficient documentation

## 2015-06-21 ENCOUNTER — Inpatient Hospital Stay: Payer: Medicare Other

## 2015-06-21 ENCOUNTER — Inpatient Hospital Stay: Payer: Medicare Other | Attending: Internal Medicine

## 2015-06-21 ENCOUNTER — Inpatient Hospital Stay (HOSPITAL_BASED_OUTPATIENT_CLINIC_OR_DEPARTMENT_OTHER): Payer: Medicare Other | Admitting: Internal Medicine

## 2015-06-21 ENCOUNTER — Encounter: Payer: Self-pay | Admitting: Internal Medicine

## 2015-06-21 ENCOUNTER — Ambulatory Visit: Payer: Medicare Other

## 2015-06-21 VITALS — BP 116/72 | HR 89 | Temp 96.1°F | Resp 18 | Ht 65.0 in | Wt 154.5 lb

## 2015-06-21 DIAGNOSIS — M549 Dorsalgia, unspecified: Secondary | ICD-10-CM

## 2015-06-21 DIAGNOSIS — D751 Secondary polycythemia: Secondary | ICD-10-CM

## 2015-06-21 DIAGNOSIS — J449 Chronic obstructive pulmonary disease, unspecified: Secondary | ICD-10-CM | POA: Diagnosis not present

## 2015-06-21 DIAGNOSIS — G8929 Other chronic pain: Secondary | ICD-10-CM | POA: Diagnosis not present

## 2015-06-21 DIAGNOSIS — Z950 Presence of cardiac pacemaker: Secondary | ICD-10-CM

## 2015-06-21 DIAGNOSIS — D75839 Thrombocytosis, unspecified: Secondary | ICD-10-CM

## 2015-06-21 DIAGNOSIS — D473 Essential (hemorrhagic) thrombocythemia: Secondary | ICD-10-CM | POA: Insufficient documentation

## 2015-06-21 DIAGNOSIS — D45 Polycythemia vera: Secondary | ICD-10-CM

## 2015-06-21 DIAGNOSIS — E785 Hyperlipidemia, unspecified: Secondary | ICD-10-CM | POA: Diagnosis not present

## 2015-06-21 DIAGNOSIS — Z8673 Personal history of transient ischemic attack (TIA), and cerebral infarction without residual deficits: Secondary | ICD-10-CM | POA: Diagnosis not present

## 2015-06-21 DIAGNOSIS — K0889 Other specified disorders of teeth and supporting structures: Secondary | ICD-10-CM | POA: Diagnosis not present

## 2015-06-21 DIAGNOSIS — Z79899 Other long term (current) drug therapy: Secondary | ICD-10-CM | POA: Diagnosis not present

## 2015-06-21 DIAGNOSIS — I739 Peripheral vascular disease, unspecified: Secondary | ICD-10-CM | POA: Diagnosis not present

## 2015-06-21 DIAGNOSIS — F17210 Nicotine dependence, cigarettes, uncomplicated: Secondary | ICD-10-CM

## 2015-06-21 LAB — CBC WITH DIFFERENTIAL/PLATELET
Basophils Absolute: 0.1 10*3/uL (ref 0–0.1)
Basophils Relative: 1 %
EOS PCT: 2 %
Eosinophils Absolute: 0.1 10*3/uL (ref 0–0.7)
HCT: 42.1 % (ref 35.0–47.0)
Hemoglobin: 13.8 g/dL (ref 12.0–16.0)
LYMPHS ABS: 1.3 10*3/uL (ref 1.0–3.6)
LYMPHS PCT: 27 %
MCH: 27.8 pg (ref 26.0–34.0)
MCHC: 32.7 g/dL (ref 32.0–36.0)
MCV: 85.1 fL (ref 80.0–100.0)
MONO ABS: 0.6 10*3/uL (ref 0.2–0.9)
Monocytes Relative: 12 %
NEUTROS ABS: 2.9 10*3/uL (ref 1.4–6.5)
NEUTROS PCT: 58 %
PLATELETS: 327 10*3/uL (ref 150–440)
RBC: 4.95 MIL/uL (ref 3.80–5.20)
RDW: 17.5 % — AB (ref 11.5–14.5)
WBC: 4.9 10*3/uL (ref 3.6–11.0)

## 2015-06-21 MED ORDER — SODIUM CHLORIDE 0.9 % IJ SOLN
10.0000 mL | INTRAMUSCULAR | Status: DC | PRN
  Administered 2015-06-21 (×2): 10 mL via INTRAVENOUS
  Filled 2015-06-21: qty 10

## 2015-06-21 MED ORDER — HEPARIN SOD (PORK) LOCK FLUSH 100 UNIT/ML IV SOLN
500.0000 [IU] | Freq: Once | INTRAVENOUS | Status: AC
  Administered 2015-06-21: 500 [IU] via INTRAVENOUS
  Filled 2015-06-21: qty 5

## 2015-06-21 NOTE — Progress Notes (Signed)
Pt was accidentally checked in by another nurse.  Pt is hard of hearing and she came when another person name was called and when pt was put in the room and performed the verification process that is when pt heard her and she was not the right pt for that nurse.  Pt was trf to the correct room with correct md and was reverified with correct info.  She is doing fine no c/o at all.

## 2015-06-21 NOTE — Progress Notes (Signed)
[place] OFFICE PROGRESS NOTE  Patient Care Team: Hortencia Pilar, MD as PCP - General (Family Medicine)   SUMMARY OF ONCOLOGIC HISTORY:  # POLYCYTHEMIA & THROMBOCYTOSIS On Hydrea 500 three/week ? Secondary   INTERVAL HISTORY:  81 year old Caucasian female patient with long-standing history of polycythemia/thrombocytosis Is here for follow-up. Patient denies any unusual weight loss denies any night sweats denies any fevers.  No history of strokes. History of any blood clots in the legs from the lungs. Patient is doing fairly well for her age. She continues to live by herself in an assisted living. She still cooks/and drives at times.    REVIEW OF SYSTEMS:  A complete 10 point review of system is done which is negative except mentioned above/history of present illness.   PAST MEDICAL HISTORY :  Past Medical History  Diagnosis Date  . Chronic back pain   . Hyperlipidemia   . Polycythemia   . COPD (chronic obstructive pulmonary disease) ([place])   . TIA (transient ischemic attack)   . PVD (peripheral vascular disease) ([place])   . Port-a-cath in place   . Polycythemia, secondary 04/26/2015  . Acoustic neuroma [facility])     md notes says history of acoustic neuroma or meningioma followed up by yearly MRI    PAST SURGICAL HISTORY :   Past Surgical History  Procedure Laterality Date  . Back surgery    . Abdominal hysterectomy    . Tonsillectomy    . Pacemaker insertion Left 02/18/2015    Procedure: INSERTION PACEMAKER;  Surgeon: Isaias Cowman, MD;  Location: [facility] ORS;  Service: Cardiovascular;  Laterality: Left;  . Cholecystectomy    . Replacement total knee    . Cataracts    . Appendectomy      FAMILY HISTORY :   Family History  Problem Relation Age of Onset  . CVA Sister   . Heart attack Brother   . Other Mother     Old age  . Other Father     Old age    SOCIAL HISTORY:   Social History  Substance Use Topics  . Smoking status: Current Every Day  Smoker -- 0.50 packs/day    Types: Cigarettes  . Smokeless tobacco: Never Used  . Alcohol Use: No    ALLERGIES:  is allergic to citalopram; pneumovax; requip; and penicillins.  MEDICATIONS:  Current Outpatient Prescriptions  Medication Sig Dispense Refill  . acetaminophen (TYLENOL) 325 MG tablet Take 2 tablets (650 mg total) by mouth every 6 (six) hours as needed for mild pain (or Fever >/= 101).    Marland Kitchen atorvastatin (LIPITOR) 10 MG tablet Take 10 mg by mouth daily.    . clopidogrel (PLAVIX) 75 MG tablet Take 75 mg by mouth daily.    . furosemide (LASIX) 20 MG tablet Take 20 mg by mouth daily.    . hydroxyurea (HYDREA) 500 MG capsule Take 500 mg by mouth 3 (three) times a week. Pt takes on Monday, Wednesday, and Friday.    . potassium chloride (K-DUR) 10 MEQ tablet Take 10 mEq by mouth daily.    . traMADol (ULTRAM) 50 MG tablet Take 1 tablet (50 mg total) by mouth every evening. 30 tablet 0   No current facility-administered medications for this visit.   Facility-Administered Medications Ordered in Other Visits  Medication Dose Route Frequency Provider Last Rate Last Dose  . heparin lock flush 100 unit/mL  500 Units Intravenous Once Evlyn Kanner, NP      . sodium chloride  0.9 % injection 10 mL  10 mL Intravenous PRN Evlyn Kanner, NP   10 mL at 06/21/15 1306    PHYSICAL EXAMINATION: ECOG PERFORMANCE STATUS: 0 - Asymptomatic  BP 116/72 mmHg  Pulse 89  Temp(Src) 96.1 F (35.6 C) (Tympanic)  Resp 18  Ht 5\' 5"  (1.651 m)  Wt 154 lb 8.7 oz (70.1 kg)  BMI 25.72 kg/m2  LMP  (LMP Unknown)  Filed Weights   06/21/15 1352  Weight: 154 lb 8.7 oz (70.1 kg)    GENERAL: Well-nourished well-developed; Alert, no distress and comfortable.   Walks with a rolling walker. She is able to sit on the exam table with some help. EYES: no pallor or icterus OROPHARYNX: Poor dentition. NECK: supple, no masses felt LYMPH:  no palpable lymphadenopathy in the cervical, axillary or inguinal  regions LUNGS: clear to auscultation and  No wheeze or crackles HEART/CVS: regular rate & rhythm and no murmurs; mild bilateral lower extremity edema ABDOMEN:abdomen soft, non-tender and normal bowel sounds Musculoskeletal:no cyanosis of digits and no clubbing  PSYCH: alert & oriented x 3 with fluent speech NEURO: no focal motor/sensory deficits SKIN:  no rashes or significant lesions  LABORATORY DATA:  I have reviewed the data as listed    Component Value Date/Time   NA 139 02/17/2015 0421   NA 138 01/12/2014 1317   K 3.5 02/17/2015 0421   K 3.1* 01/12/2014 1317   CL 107 02/17/2015 0421   CL 102 01/12/2014 1317   CO2 28 02/17/2015 0421   CO2 31 01/12/2014 1317   GLUCOSE 101* 02/17/2015 0421   GLUCOSE 114* 01/12/2014 1317   BUN 9 02/17/2015 0421   BUN 14 01/12/2014 1317   CREATININE 0.94 02/17/2015 0421   CREATININE 1.27 01/12/2014 1317   CALCIUM 8.4* 02/17/2015 0421   CALCIUM 9.0 01/12/2014 1317   PROT 7.3 01/12/2014 1317   ALBUMIN 3.5 01/12/2014 1317   AST 22 01/12/2014 1317   ALT 12 01/12/2014 1317   ALKPHOS 113 01/12/2014 1317   BILITOT 0.4 01/12/2014 1317   GFRNONAA 52* 02/17/2015 0421   GFRNONAA 38* 01/12/2014 1317   GFRAA >60 02/17/2015 0421   GFRAA 44* 01/12/2014 1317    No results found for: SPEP, UPEP  Lab Results  Component Value Date   WBC 6.2 05/24/2015   NEUTROABS 3.8 05/24/2015   HGB 14.4 05/24/2015   HCT 44.2 05/24/2015   MCV 85.5 05/24/2015   PLT 378 05/24/2015      Chemistry      Component Value Date/Time   NA 139 02/17/2015 0421   NA 138 01/12/2014 1317   K 3.5 02/17/2015 0421   K 3.1* 01/12/2014 1317   CL 107 02/17/2015 0421   CL 102 01/12/2014 1317   CO2 28 02/17/2015 0421   CO2 31 01/12/2014 1317   BUN 9 02/17/2015 0421   BUN 14 01/12/2014 1317   CREATININE 0.94 02/17/2015 0421   CREATININE 1.27 01/12/2014 1317      Component Value Date/Time   CALCIUM 8.4* 02/17/2015 0421   CALCIUM 9.0 01/12/2014 1317   ALKPHOS 113  01/12/2014 1317   AST 22 01/12/2014 1317   ALT 12 01/12/2014 1317   BILITOT 0.4 01/12/2014 1317       RADIOGRAPHIC STUDIES: I have personally reviewed the radiological images as listed and agreed with the findings in the report. No results found.   ASSESSMENT & PLAN:   # POLYCTHEMIA VERA\ THROMBOCYTOSIS- on Hydrea 500 three times/week. Patient has been getting  phlebotomy every 2 months or so. Today hematocrit is 42.1/hemoglobin 13.8- normal white count and platelets. I would not recommend phlebotomy unless hematocrit is greater than 45. Patient has prior history of TIA.  # Patient labs in 6 weeks/follow-up with me in 3 months with labs. Possible phlebotomy. No orders of the defined types were placed in this encounter.   All questions were answered. The patient knows to call the clinic with any problems, questions or concerns. No barriers to learning was detected.  I spent 15 minutes counseling the patient face to face. The total time spent in the appointment was 30 minutes and more than 50% was on counseling and review of test results     Cammie Sickle, MD 06/21/2015 2:01 PM

## 2015-07-31 ENCOUNTER — Inpatient Hospital Stay: Payer: Medicare Other | Attending: Internal Medicine

## 2015-07-31 ENCOUNTER — Inpatient Hospital Stay: Payer: Medicare Other

## 2015-07-31 DIAGNOSIS — D45 Polycythemia vera: Secondary | ICD-10-CM | POA: Insufficient documentation

## 2015-07-31 DIAGNOSIS — D751 Secondary polycythemia: Secondary | ICD-10-CM

## 2015-07-31 LAB — HEMATOCRIT: HEMATOCRIT: 41.4 % (ref 35.0–47.0)

## 2015-07-31 LAB — HEMOGLOBIN: Hemoglobin: 13.7 g/dL (ref 12.0–16.0)

## 2015-07-31 MED ORDER — HEPARIN SOD (PORK) LOCK FLUSH 100 UNIT/ML IV SOLN
500.0000 [IU] | Freq: Once | INTRAVENOUS | Status: AC
  Administered 2015-07-31: 500 [IU] via INTRAVENOUS

## 2015-07-31 MED ORDER — HEPARIN SOD (PORK) LOCK FLUSH 100 UNIT/ML IV SOLN
INTRAVENOUS | Status: AC
  Filled 2015-07-31: qty 5

## 2015-07-31 MED ORDER — SODIUM CHLORIDE 0.9 % IJ SOLN
10.0000 mL | Freq: Once | INTRAMUSCULAR | Status: AC
  Administered 2015-07-31: 10 mL via INTRAVENOUS
  Filled 2015-07-31: qty 10

## 2015-08-21 ENCOUNTER — Other Ambulatory Visit: Payer: Medicare Other

## 2015-09-18 ENCOUNTER — Inpatient Hospital Stay: Payer: Medicare Other

## 2015-09-18 ENCOUNTER — Inpatient Hospital Stay: Payer: Medicare Other | Admitting: Internal Medicine

## 2015-10-03 ENCOUNTER — Inpatient Hospital Stay: Payer: Medicare Other

## 2015-10-03 ENCOUNTER — Inpatient Hospital Stay: Payer: Medicare Other | Attending: Internal Medicine | Admitting: Internal Medicine

## 2015-10-03 VITALS — BP 106/66 | HR 83 | Temp 98.0°F | Ht 65.0 in | Wt 154.6 lb

## 2015-10-03 DIAGNOSIS — I739 Peripheral vascular disease, unspecified: Secondary | ICD-10-CM

## 2015-10-03 DIAGNOSIS — F17210 Nicotine dependence, cigarettes, uncomplicated: Secondary | ICD-10-CM | POA: Insufficient documentation

## 2015-10-03 DIAGNOSIS — H9190 Unspecified hearing loss, unspecified ear: Secondary | ICD-10-CM | POA: Insufficient documentation

## 2015-10-03 DIAGNOSIS — F17200 Nicotine dependence, unspecified, uncomplicated: Secondary | ICD-10-CM | POA: Insufficient documentation

## 2015-10-03 DIAGNOSIS — D473 Essential (hemorrhagic) thrombocythemia: Secondary | ICD-10-CM | POA: Insufficient documentation

## 2015-10-03 DIAGNOSIS — D751 Secondary polycythemia: Secondary | ICD-10-CM

## 2015-10-03 DIAGNOSIS — G939 Disorder of brain, unspecified: Secondary | ICD-10-CM | POA: Insufficient documentation

## 2015-10-03 DIAGNOSIS — E785 Hyperlipidemia, unspecified: Secondary | ICD-10-CM | POA: Diagnosis not present

## 2015-10-03 DIAGNOSIS — Z95828 Presence of other vascular implants and grafts: Secondary | ICD-10-CM | POA: Insufficient documentation

## 2015-10-03 DIAGNOSIS — G8929 Other chronic pain: Secondary | ICD-10-CM | POA: Diagnosis not present

## 2015-10-03 DIAGNOSIS — M549 Dorsalgia, unspecified: Secondary | ICD-10-CM | POA: Diagnosis not present

## 2015-10-03 DIAGNOSIS — Z8673 Personal history of transient ischemic attack (TIA), and cerebral infarction without residual deficits: Secondary | ICD-10-CM

## 2015-10-03 DIAGNOSIS — D3610 Benign neoplasm of peripheral nerves and autonomic nervous system, unspecified: Secondary | ICD-10-CM | POA: Insufficient documentation

## 2015-10-03 DIAGNOSIS — R42 Dizziness and giddiness: Secondary | ICD-10-CM | POA: Insufficient documentation

## 2015-10-03 DIAGNOSIS — D497 Neoplasm of unspecified behavior of endocrine glands and other parts of nervous system: Secondary | ICD-10-CM | POA: Insufficient documentation

## 2015-10-03 DIAGNOSIS — J449 Chronic obstructive pulmonary disease, unspecified: Secondary | ICD-10-CM | POA: Insufficient documentation

## 2015-10-03 DIAGNOSIS — I6782 Cerebral ischemia: Secondary | ICD-10-CM | POA: Insufficient documentation

## 2015-10-03 DIAGNOSIS — E782 Mixed hyperlipidemia: Secondary | ICD-10-CM | POA: Insufficient documentation

## 2015-10-03 DIAGNOSIS — Z79899 Other long term (current) drug therapy: Secondary | ICD-10-CM | POA: Diagnosis not present

## 2015-10-03 DIAGNOSIS — H547 Unspecified visual loss: Secondary | ICD-10-CM | POA: Insufficient documentation

## 2015-10-03 LAB — CBC WITH DIFFERENTIAL/PLATELET
Basophils Absolute: 0.1 10*3/uL (ref 0–0.1)
Basophils Relative: 1 %
Eosinophils Absolute: 0.1 10*3/uL (ref 0–0.7)
Eosinophils Relative: 2 %
HEMATOCRIT: 43.4 % (ref 35.0–47.0)
Hemoglobin: 14.6 g/dL (ref 12.0–16.0)
LYMPHS ABS: 1.6 10*3/uL (ref 1.0–3.6)
Lymphocytes Relative: 30 %
MCH: 28.6 pg (ref 26.0–34.0)
MCHC: 33.7 g/dL (ref 32.0–36.0)
MCV: 84.9 fL (ref 80.0–100.0)
MONO ABS: 0.6 10*3/uL (ref 0.2–0.9)
MONOS PCT: 11 %
NEUTROS ABS: 3 10*3/uL (ref 1.4–6.5)
Neutrophils Relative %: 56 %
Platelets: 359 10*3/uL (ref 150–440)
RBC: 5.11 MIL/uL (ref 3.80–5.20)
RDW: 17.2 % — AB (ref 11.5–14.5)
WBC: 5.4 10*3/uL (ref 3.6–11.0)

## 2015-10-03 LAB — COMPREHENSIVE METABOLIC PANEL
ALBUMIN: 3.8 g/dL (ref 3.5–5.0)
ALT: 12 U/L — ABNORMAL LOW (ref 14–54)
ANION GAP: 9 (ref 5–15)
AST: 17 U/L (ref 15–41)
Alkaline Phosphatase: 109 U/L (ref 38–126)
BILIRUBIN TOTAL: 0.6 mg/dL (ref 0.3–1.2)
BUN: 9 mg/dL (ref 6–20)
CO2: 31 mmol/L (ref 22–32)
Calcium: 8.8 mg/dL — ABNORMAL LOW (ref 8.9–10.3)
Chloride: 95 mmol/L — ABNORMAL LOW (ref 101–111)
Creatinine, Ser: 1.04 mg/dL — ABNORMAL HIGH (ref 0.44–1.00)
GFR, EST AFRICAN AMERICAN: 54 mL/min — AB (ref >60)
GFR, EST NON AFRICAN AMERICAN: 46 mL/min — AB (ref >60)
Glucose, Bld: 111 mg/dL — ABNORMAL HIGH (ref 65–99)
POTASSIUM: 3 mmol/L — AB (ref 3.5–5.1)
Sodium: 135 mmol/L (ref 135–145)
TOTAL PROTEIN: 6.9 g/dL (ref 6.5–8.1)

## 2015-10-03 MED ORDER — HEPARIN SOD (PORK) LOCK FLUSH 100 UNIT/ML IV SOLN
500.0000 [IU] | Freq: Once | INTRAVENOUS | Status: AC
  Administered 2015-10-03 (×2): 500 [IU] via INTRAVENOUS

## 2015-10-03 MED ORDER — SODIUM CHLORIDE 0.9% FLUSH
10.0000 mL | INTRAVENOUS | Status: AC | PRN
  Administered 2015-10-03: 10 mL via INTRAVENOUS
  Filled 2015-10-03: qty 10

## 2015-10-03 MED ORDER — HEPARIN SOD (PORK) LOCK FLUSH 100 UNIT/ML IV SOLN
INTRAVENOUS | Status: AC
  Filled 2015-10-03: qty 5

## 2015-10-03 NOTE — Progress Notes (Signed)
[facility] OFFICE PROGRESS NOTE  Patient Care Team: Hortencia Pilar, MD as PCP - General (Family Medicine)   SUMMARY OF ONCOLOGIC HISTORY:  # POLYCYTHEMIA & THROMBOCYTOSIS On Hydrea 500 three/week ? Secondary   # smoking  INTERVAL HISTORY:  82 year old Caucasian female patient with long-standing history of polycythemia/thrombocytosis on Hydrea; and long-standing history of smoking Is here for follow-up.   Patient denies any unusual weight loss denies any night sweats denies any fevers. No history of strokes.Patient is doing fairly well for her age.   She complains of mild back pain which is again chronic; not  any worse.   REVIEW OF SYSTEMS:  A complete 10 point review of system is done which is negative except mentioned above/history of present illness.   PAST MEDICAL HISTORY :  Past Medical History  Diagnosis Date  . Chronic back pain   . Hyperlipidemia   . Polycythemia   . COPD (chronic obstructive pulmonary disease) ([place])   . TIA (transient ischemic attack)   . PVD (peripheral vascular disease) ([place])   . Port-a-cath in place   . Polycythemia, secondary 04/26/2015  . Acoustic neuroma [facility])     md notes says history of acoustic neuroma or meningioma followed up by yearly MRI    PAST SURGICAL HISTORY :   Past Surgical History  Procedure Laterality Date  . Back surgery    . Abdominal hysterectomy    . Tonsillectomy    . Pacemaker insertion Left 02/18/2015    Procedure: INSERTION PACEMAKER;  Surgeon: Isaias Cowman, MD;  Location: [facility] ORS;  Service: Cardiovascular;  Laterality: Left;  . Cholecystectomy    . Replacement total knee    . Cataracts    . Appendectomy      FAMILY HISTORY :   Family History  Problem Relation Age of Onset  . CVA Sister   . Heart attack Brother   . Other Mother     Old age  . Other Father     Old age    SOCIAL HISTORY:  She continues to live by herself in an assisted living. She still cooks/and drives at times.    Social History  Substance Use Topics  . Smoking status: Current Every Day Smoker -- 0.50 packs/day    Types: Cigarettes  . Smokeless tobacco: Never Used  . Alcohol Use: No    ALLERGIES:  is allergic to citalopram; pneumovax; requip; and penicillins.  MEDICATIONS:  Current Outpatient Prescriptions  Medication Sig Dispense Refill  . acetaminophen (TYLENOL) 325 MG tablet Take 2 tablets (650 mg total) by mouth every 6 (six) hours as needed for mild pain (or Fever >/= 101).    Marland Kitchen atorvastatin (LIPITOR) 10 MG tablet Take 10 mg by mouth daily.    . clopidogrel (PLAVIX) 75 MG tablet Take 75 mg by mouth daily.    . hydroxyurea (HYDREA) 500 MG capsule Take 500 mg by mouth 3 (three) times a week. Pt takes on Monday, Wednesday, and Friday.    . potassium chloride (K-DUR) 10 MEQ tablet Take 10 mEq by mouth daily.    . traMADol (ULTRAM) 50 MG tablet Take 1 tablet (50 mg total) by mouth every evening. 30 tablet 0  . furosemide (LASIX) 20 MG tablet Take 20 mg by mouth daily.    . magnesium oxide (MAG-OX) 400 MG tablet TAKE 1 TABLET(400 MG) BY MOUTH EVERY DAY     No current facility-administered medications for this visit.   Facility-Administered Medications Ordered in Other Visits  Medication Dose Route Frequency Provider Last Rate Last Dose  . sodium chloride flush (NS) 0.9 % injection 10 mL  10 mL Intravenous PRN Evlyn Kanner, NP   10 mL at 10/03/15 0957    PHYSICAL EXAMINATION: ECOG PERFORMANCE STATUS: 0 - Asymptomatic  BP 106/66 mmHg  Pulse 83  Temp(Src) 98 F (36.7 C) (Oral)  Ht 5\' 5"  (1.651 m)  Wt 154 lb 9 oz (70.109 kg)  BMI 25.72 kg/m2  LMP  (LMP Unknown)  Filed Weights   10/03/15 1014  Weight: 154 lb 9 oz (70.109 kg)    GENERAL: Well-nourished well-developed; Alert, no distress and comfortable.   Walks with a rolling walker. She is able to sit on the exam table with some help. EYES: no pallor or icterus OROPHARYNX: Poor dentition. NECK: supple, no masses felt LYMPH:   no palpable lymphadenopathy in the cervical, axillary or inguinal regions LUNGS: decreased breath sounds bilateral and  No wheeze or crackles HEART/CVS: regular rate & rhythm and no murmurs; mild bilateral lower extremity edema ABDOMEN:abdomen soft, non-tender and normal bowel sounds Musculoskeletal:no cyanosis of digits and no clubbing  PSYCH: alert & oriented x 3 with fluent speech NEURO: no focal motor/sensory deficits SKIN:  no rashes or significant lesions  LABORATORY DATA:  I have reviewed the data as listed    Component Value Date/Time   NA 135 10/03/2015 0954   NA 138 01/12/2014 1317   K 3.0* 10/03/2015 0954   K 3.1* 01/12/2014 1317   CL 95* 10/03/2015 0954   CL 102 01/12/2014 1317   CO2 31 10/03/2015 0954   CO2 31 01/12/2014 1317   GLUCOSE 111* 10/03/2015 0954   GLUCOSE 114* 01/12/2014 1317   BUN 9 10/03/2015 0954   BUN 14 01/12/2014 1317   CREATININE 1.04* 10/03/2015 0954   CREATININE 1.27 01/12/2014 1317   CALCIUM 8.8* 10/03/2015 0954   CALCIUM 9.0 01/12/2014 1317   PROT 6.9 10/03/2015 0954   PROT 7.3 01/12/2014 1317   ALBUMIN 3.8 10/03/2015 0954   ALBUMIN 3.5 01/12/2014 1317   AST 17 10/03/2015 0954   AST 22 01/12/2014 1317   ALT 12* 10/03/2015 0954   ALT 12 01/12/2014 1317   ALKPHOS 109 10/03/2015 0954   ALKPHOS 113 01/12/2014 1317   BILITOT 0.6 10/03/2015 0954   BILITOT 0.4 01/12/2014 1317   GFRNONAA 46* 10/03/2015 0954   GFRNONAA 38* 01/12/2014 1317   GFRAA 54* 10/03/2015 0954   GFRAA 44* 01/12/2014 1317    No results found for: SPEP, UPEP  Lab Results  Component Value Date   WBC 5.4 10/03/2015   NEUTROABS 3.0 10/03/2015   HGB 14.6 10/03/2015   HCT 43.4 10/03/2015   MCV 84.9 10/03/2015   PLT 359 10/03/2015      Chemistry      Component Value Date/Time   NA 135 10/03/2015 0954   NA 138 01/12/2014 1317   K 3.0* 10/03/2015 0954   K 3.1* 01/12/2014 1317   CL 95* 10/03/2015 0954   CL 102 01/12/2014 1317   CO2 31 10/03/2015 0954   CO2 31  01/12/2014 1317   BUN 9 10/03/2015 0954   BUN 14 01/12/2014 1317   CREATININE 1.04* 10/03/2015 0954   CREATININE 1.27 01/12/2014 1317      Component Value Date/Time   CALCIUM 8.8* 10/03/2015 0954   CALCIUM 9.0 01/12/2014 1317   ALKPHOS 109 10/03/2015 0954   ALKPHOS 113 01/12/2014 1317   AST 17 10/03/2015 0954   AST 22 01/12/2014  1317   ALT 12* 10/03/2015 0954   ALT 12 01/12/2014 1317   BILITOT 0.6 10/03/2015 0954   BILITOT 0.4 01/12/2014 1317       RADIOGRAPHIC STUDIES: I have personally reviewed the radiological images as listed and agreed with the findings in the report. No results found.   ASSESSMENT & PLAN:   # POLYCTHEMIA VERA\ THROMBOCYTOSIS- on Hydrea 500 three times/week. Patient's hematocrit today is 43 hemoglobin is 14.6. I would not recommend any phlebotomy at this time; I would recommend phlebotomy if  Hematocrit is  Greater than 45.  # unfortunately patient continues to smoke- patient not interested in quitting smoking  # patient will follow-up with me in 6 months CBC CMP at that time.   I spent 15 minutes counseling the patient face to face. The total time spent in the appointment was 30 minutes and more than 50% was on counseling and review of test results     Cammie Sickle, MD 10/03/2015 10:25 AM

## 2015-11-28 ENCOUNTER — Inpatient Hospital Stay: Payer: Medicare Other | Attending: Internal Medicine

## 2015-11-28 DIAGNOSIS — D751 Secondary polycythemia: Secondary | ICD-10-CM | POA: Diagnosis present

## 2015-11-28 DIAGNOSIS — Z452 Encounter for adjustment and management of vascular access device: Secondary | ICD-10-CM | POA: Diagnosis not present

## 2015-11-28 MED ORDER — HEPARIN SOD (PORK) LOCK FLUSH 100 UNIT/ML IV SOLN
500.0000 [IU] | Freq: Once | INTRAVENOUS | Status: AC
  Administered 2015-11-28: 500 [IU] via INTRAVENOUS
  Filled 2015-11-28: qty 5

## 2015-11-28 MED ORDER — SODIUM CHLORIDE 0.9% FLUSH
10.0000 mL | INTRAVENOUS | Status: DC | PRN
  Administered 2015-11-28: 10 mL via INTRAVENOUS
  Filled 2015-11-28: qty 10

## 2015-11-30 ENCOUNTER — Emergency Department
Admission: EM | Admit: 2015-11-30 | Discharge: 2015-11-30 | Disposition: A | Discharge status: Home / Self Care | Payer: Medicare Other | Attending: Emergency Medicine | Admitting: Emergency Medicine

## 2015-11-30 ENCOUNTER — Other Ambulatory Visit: Payer: Self-pay

## 2015-11-30 ENCOUNTER — Encounter: Payer: Self-pay | Admitting: Emergency Medicine

## 2015-11-30 ENCOUNTER — Emergency Department: Payer: Medicare Other

## 2015-11-30 DIAGNOSIS — Z79899 Other long term (current) drug therapy: Secondary | ICD-10-CM | POA: Diagnosis not present

## 2015-11-30 DIAGNOSIS — Z880 Allergy status to penicillin: Secondary | ICD-10-CM | POA: Diagnosis not present

## 2015-11-30 DIAGNOSIS — R110 Nausea: Secondary | ICD-10-CM | POA: Diagnosis present

## 2015-11-30 DIAGNOSIS — R05 Cough: Secondary | ICD-10-CM | POA: Insufficient documentation

## 2015-11-30 DIAGNOSIS — W01198A Fall on same level from slipping, tripping and stumbling with subsequent striking against other object, initial encounter: Secondary | ICD-10-CM | POA: Diagnosis not present

## 2015-11-30 DIAGNOSIS — R509 Fever, unspecified: Secondary | ICD-10-CM | POA: Insufficient documentation

## 2015-11-30 DIAGNOSIS — Y998 Other external cause status: Secondary | ICD-10-CM | POA: Insufficient documentation

## 2015-11-30 DIAGNOSIS — Z043 Encounter for examination and observation following other accident: Secondary | ICD-10-CM | POA: Diagnosis not present

## 2015-11-30 DIAGNOSIS — F17210 Nicotine dependence, cigarettes, uncomplicated: Secondary | ICD-10-CM | POA: Insufficient documentation

## 2015-11-30 DIAGNOSIS — Z7902 Long term (current) use of antithrombotics/antiplatelets: Secondary | ICD-10-CM | POA: Insufficient documentation

## 2015-11-30 DIAGNOSIS — Y92002 Bathroom of unspecified non-institutional (private) residence single-family (private) house as the place of occurrence of the external cause: Secondary | ICD-10-CM | POA: Insufficient documentation

## 2015-11-30 DIAGNOSIS — Y9389 Activity, other specified: Secondary | ICD-10-CM | POA: Insufficient documentation

## 2015-11-30 DIAGNOSIS — W19XXXA Unspecified fall, initial encounter: Secondary | ICD-10-CM

## 2015-11-30 LAB — CBC WITH DIFFERENTIAL/PLATELET
Basophils Absolute: 0 10*3/uL (ref 0–0.1)
Basophils Relative: 1 %
EOS PCT: 0 %
Eosinophils Absolute: 0 10*3/uL (ref 0–0.7)
HEMATOCRIT: 42.5 % (ref 35.0–47.0)
Hemoglobin: 14.1 g/dL (ref 12.0–16.0)
LYMPHS ABS: 0.4 10*3/uL — AB (ref 1.0–3.6)
LYMPHS PCT: 7 %
MCH: 29 pg (ref 26.0–34.0)
MCHC: 33.1 g/dL (ref 32.0–36.0)
MCV: 87.8 fL (ref 80.0–100.0)
Monocytes Absolute: 0.4 10*3/uL (ref 0.2–0.9)
Monocytes Relative: 8 %
Neutro Abs: 4.6 10*3/uL (ref 1.4–6.5)
Neutrophils Relative %: 84 %
PLATELETS: 286 10*3/uL (ref 150–440)
RBC: 4.84 MIL/uL (ref 3.80–5.20)
RDW: 16.9 % — ABNORMAL HIGH (ref 11.5–14.5)
WBC: 5.4 10*3/uL (ref 3.6–11.0)

## 2015-11-30 LAB — COMPREHENSIVE METABOLIC PANEL
ALT: 12 U/L — ABNORMAL LOW (ref 14–54)
ANION GAP: 8 (ref 5–15)
AST: 23 U/L (ref 15–41)
Albumin: 3.4 g/dL — ABNORMAL LOW (ref 3.5–5.0)
Alkaline Phosphatase: 101 U/L (ref 38–126)
BUN: 7 mg/dL (ref 6–20)
CHLORIDE: 103 mmol/L (ref 101–111)
CO2: 25 mmol/L (ref 22–32)
Calcium: 8.7 mg/dL — ABNORMAL LOW (ref 8.9–10.3)
Creatinine, Ser: 1.02 mg/dL — ABNORMAL HIGH (ref 0.44–1.00)
GFR calc non Af Amer: 47 mL/min — ABNORMAL LOW (ref >60)
GFR, EST AFRICAN AMERICAN: 54 mL/min — AB (ref >60)
Glucose, Bld: 145 mg/dL — ABNORMAL HIGH (ref 65–99)
Potassium: 3.5 mmol/L (ref 3.5–5.1)
SODIUM: 136 mmol/L (ref 135–145)
Total Bilirubin: 1 mg/dL (ref 0.3–1.2)
Total Protein: 6.3 g/dL — ABNORMAL LOW (ref 6.5–8.1)

## 2015-11-30 LAB — URINALYSIS COMPLETE WITH MICROSCOPIC (ARMC ONLY)
BACTERIA UA: NONE SEEN
Bilirubin Urine: NEGATIVE
GLUCOSE, UA: NEGATIVE mg/dL
Leukocytes, UA: NEGATIVE
NITRITE: NEGATIVE
Protein, ur: 30 mg/dL — AB
SPECIFIC GRAVITY, URINE: 1.019 (ref 1.005–1.030)
pH: 7 (ref 5.0–8.0)

## 2015-11-30 LAB — TROPONIN I

## 2015-11-30 LAB — RAPID INFLUENZA A&B ANTIGENS (ARMC ONLY): INFLUENZA B (ARMC): NEGATIVE

## 2015-11-30 LAB — LACTIC ACID, PLASMA
Lactic Acid, Venous: 1.8 mmol/L (ref 0.5–2.0)
Lactic Acid, Venous: 1 mmol/L (ref 0.5–2.0)

## 2015-11-30 LAB — RAPID INFLUENZA A&B ANTIGENS: Influenza A (ARMC): NEGATIVE

## 2015-11-30 MED ORDER — ACETAMINOPHEN 500 MG PO TABS
1000.0000 mg | ORAL_TABLET | Freq: Once | ORAL | Status: AC
  Administered 2015-11-30: 1000 mg via ORAL
  Filled 2015-11-30: qty 2

## 2015-11-30 MED ORDER — SODIUM CHLORIDE 0.9 % IV BOLUS (SEPSIS)
1000.0000 mL | Freq: Once | INTRAVENOUS | Status: AC
  Administered 2015-11-30: 1000 mL via INTRAVENOUS

## 2015-11-30 MED ORDER — ONDANSETRON HCL 4 MG/2ML IJ SOLN
4.0000 mg | Freq: Once | INTRAMUSCULAR | Status: AC
  Administered 2015-11-30: 4 mg via INTRAVENOUS
  Filled 2015-11-30: qty 2

## 2015-11-30 NOTE — ED Notes (Signed)
Pt verbalized understanding of discharge instructions and transported by EMS to home. NAD at this time.

## 2015-11-30 NOTE — Discharge Instructions (Signed)
1. Alternate Tylenol and ibuprofen every 4 hours as needed for fever greater than 100.52F. 2. Drink plenty of fluids daily. 3. Return to the ER for worsening symptoms, persistent vomiting, difficulty breathing or other concerns.  Fever, Adult A fever is an increase in the body's temperature. It is usually defined as a temperature of 100F (38C) or higher. Brief mild or moderate fevers generally have no long-term effects, and they often do not require treatment. Moderate or high fevers may make you feel uncomfortable and can sometimes be a sign of a serious illness or disease. The sweating that may occur with repeated or prolonged fever may also cause dehydration. Fever is confirmed by taking a temperature with a thermometer. A measured temperature can vary with:  Age.  Time of day.  Location of the thermometer:  Mouth (oral).  Rectum (rectal).  Ear (tympanic).  Underarm (axillary).  Forehead (temporal). HOME CARE INSTRUCTIONS Pay attention to any changes in your symptoms. Take these actions to help with your condition:  Take over-the counter and prescription medicines only as told by your health care provider. Follow the dosing instructions carefully.  If you were prescribed an antibiotic medicine, take it as told by your health care provider. Do not stop taking the antibiotic even if you start to feel better.  Rest as needed.  Drink enough fluid to keep your urine clear or pale yellow. This helps to prevent dehydration.  Sponge yourself or bathe with room-temperature water to help reduce your body temperature as needed. Do not use ice water.  Do not overbundle yourself in blankets or heavy clothes. SEEK MEDICAL CARE IF:  You vomit.  You cannot eat or drink without vomiting.  You have diarrhea.  You have pain when you urinate.  Your symptoms do not improve with treatment.  You develop new symptoms.  You develop excessive weakness. SEEK IMMEDIATE MEDICAL CARE  IF:  You have shortness of breath or have trouble breathing.  You are dizzy or you faint.  You are disoriented or confused.  You develop signs of dehydration, such as a dry mouth, decreased urination, or paleness.  You develop severe pain in your abdomen.  You have persistent vomiting or diarrhea.  You develop a skin rash.  Your symptoms suddenly get worse.   This information is not intended to replace advice given to you by your health care provider. Make sure you discuss any questions you have with your health care provider.   Document Released: 02/17/2001 Document Revised: 05/15/2015 Document Reviewed: 10/18/2014 Elsevier Interactive Patient Education 2016 [employer].  Blood Culture Test WHY AM I HAVING THIS TEST? A blood culture test is performed to see if you have an infection in your blood (septicemia). Septicemia could be caused by bacteria, fungi, or viruses. Normally, blood is free of bacteria, fungi, and viruses. This test may be ordered if you have symptoms of septicemia. These symptoms may include fever, chills, nausea, and fatigue. WHAT KIND OF SAMPLE IS TAKEN? At least two blood samples from two different veins are required for this test. The blood samples are usually collected by inserting a needle into a vein. This is done because:  There is a better chance of finding the infection with multiple samples.  Sometimes, despite disinfection of the skin where the blood is collected, you can grow a skin contaminant. This will result in a positive blood culture. This is called a false-positive. With multiple samples, there is a better chance of ruling out a false-positive. HOW DO  I PREPARE FOR THE TEST? It is preferred to have the blood samples performed before starting antibiotic medicine. Tell your health care provider if you are currently taking an antibiotic. If blood cultures are performed while you are on an antibiotic, the blood samples should be performed shortly  before you take a dose of antibiotic. HOW ARE YOUR TEST RESULTS REPORTED? Your test results will be reported as either positive or negative. It is your responsibility to obtain your test results. Ask the lab or department performing the test when and how you will get your results. A false-positive result can occur. A false-positive result is incorrect because it indicates a condition or finding is present when it is not. A false-negative result can occur. A false-negative result is incorrect because it indicates a condition or finding is not present when it is. WHAT DO THE RESULTS MEAN? A positive blood test may mean that you have septicemia. Talk with your health care provider to discuss your results, treatment options, and if necessary, the need for more tests. Talk with your health care provider if you have any questions about your results.   This information is not intended to replace advice given to you by your health care provider. Make sure you discuss any questions you have with your health care provider.   Document Released: 09/16/2004 Document Revised: 09/14/2014 Document Reviewed: 01/29/2014 Elsevier Interactive Patient Education 2016 [place] in Hospitals, Adult As a hospital patient, your condition and the treatments you receive can increase your risk for falls. Some additional risk factors for falls in a hospital include:  Being in an unfamiliar environment.  Being on bed rest.  Your surgery.  Taking certain medicines.  Your tubing requirements, such as intravenous (IV) therapy or catheters. It is important that you learn how to decrease fall risks while at the hospital. Below are important tips that can help prevent falls. SAFETY TIPS FOR PREVENTING FALLS Talk about your risk of falling.  Ask your health care provider why you are at risk for falling. Is it your medicine, illness, tubing placement, or something else?  Make a plan with your health  care provider to keep you safe from falls.  Ask your health care provider or pharmacist about side effects of your medicines. Some medicines can make you dizzy or affect your coordination. Ask for help.  Ask for help before getting out of bed. You may need to press your call button.  Ask for assistance in getting safely to the toilet.  Ask for a walker or cane to be put at your bedside. Ask that most of the side rails on your bed be placed up before your health care provider leaves the room.  Ask family or friends to sit with you.  Ask for things that are out of your reach, such as your glasses, hearing aids, telephone, bedside table, or call button. Follow these tips to avoid falling:  Stay lying or seated, rather than standing, while waiting for help.  Wear rubber-soled slippers or shoes whenever you walk in the hospital.  Avoid quick, sudden movements.  Change positions slowly.  Sit on the side of your bed before standing.  Stand up slowly and wait before you start to walk.  Let your health care provider know if there is a spill on the floor.  Pay careful attention to the medical equipment, electrical cords, and tubes around you.  When you need help, use your call button by your bed or in  the bathroom. Wait for one of your health care providers to help you.  If you feel dizzy or unsure of your footing, return to bed and wait for assistance.  Avoid being distracted by the TV, telephone, or another person in your room.  Do not lean or support yourself on rolling objects, such as IV poles or bedside tables.   This information is not intended to replace advice given to you by your health care provider. Make sure you discuss any questions you have with your health care provider.   Document Released: 08/21/2000 Document Revised: 09/14/2014 Document Reviewed: 05/01/2012 Elsevier Interactive Patient Education [employer].

## 2015-11-30 NOTE — ED Notes (Signed)
Patient presents to Emergency Department via EMS with complaints of fall and subsequent nausea.  Pt from [facility] (indepentdent living).    Pt reports hitting her head and falling when she went to the bathroom, pt reached for walker and fell.  Pt denies LOC.  Pt has port in in top right chest (dt blood disorder), pt has pacemaker in left chest.    Pt denies pain C/O nausea

## 2015-11-30 NOTE — ED Provider Notes (Signed)
[facility] Emergency Department Provider Note  ____________________________________________  Time seen: Approximately 3:17 AM  I have reviewed the triage vital signs and the nursing notes.   HISTORY  Chief Complaint Fall    HPI Krista Ortiz is a 82 y.o. female who presents to the ED from nursing facility via EMS with a chief complaint of fall. Patient reports reaching for her walker to use the restroom, lost her balance and fell. Reports striking her head butdenies LOC. Initially complained of pain in her left hip but denies pain currently. Complains only of nausea. Notes nonproductive cough recently. Patient wears oxygen at night. Denies recent fever, chills, chest pain, shortness of breath, abdominal pain, vomiting, diarrhea, dysuria. Denies recent travel.   Past Medical History  Diagnosis Date  . Chronic back pain   . Hyperlipidemia   . Polycythemia   . COPD (chronic obstructive pulmonary disease) ([place])   . TIA (transient ischemic attack)   . PVD (peripheral vascular disease) ([place])   . Port-a-cath in place   . Polycythemia, secondary 04/26/2015  . Acoustic neuroma [facility])     md notes says history of acoustic neuroma or meningioma followed up by yearly MRI    Patient Active Problem List   Diagnosis Date Noted  . Back ache 10/03/2015  . Chronic obstructive pulmonary disease ([place]) 10/03/2015  . Dizziness 10/03/2015  . Decrease in the ability to hear 10/03/2015  . Neoplasm of meninges ([place]) 10/03/2015  . Combined fat and carbohydrate induced hyperlipemia 10/03/2015  . Neuroma 10/03/2015  . Impaired vision 10/03/2015  . Presence of other vascular implants and grafts 10/03/2015  . Current smoker 10/03/2015  . Temporary cerebral vascular dysfunction 10/03/2015  . Abnormal CBC 06/12/2015  . Chronic LBP 06/12/2015  . Polycythemia, secondary 04/26/2015  . Hypersensitivity to pneumococcal vaccine 02/21/2015  . Carotid artery narrowing 02/19/2015   . Sick sinus syndrome ([place]) 02/16/2015  . Syncope and collapse 02/15/2015  . Edema leg 10/25/2014  . TI (tricuspid incompetence) 10/25/2014  . Breathlessness on exertion 10/25/2014  . Chest pain at rest 10/03/2014  . Dyspnea, paroxysmal nocturnal 10/03/2014  . Amnesia 04/02/2014  . Abnormal weight loss 04/02/2014  . Osteopenia 03/15/2014  . Chronic pain 07/06/2013  . Peripheral vascular disease ([place]) 06/27/2013  . Ejection murmur 12/09/2012    Past Surgical History  Procedure Laterality Date  . Back surgery    . Abdominal hysterectomy    . Tonsillectomy    . Pacemaker insertion Left 02/18/2015    Procedure: INSERTION PACEMAKER;  Surgeon: Isaias Cowman, MD;  Location: [facility] ORS;  Service: Cardiovascular;  Laterality: Left;  . Cholecystectomy    . Replacement total knee    . Cataracts    . Appendectomy      Current Outpatient Rx  Name  Route  Sig  Dispense  Refill  . atorvastatin (LIPITOR) 10 MG tablet   Oral   Take 10 mg by mouth daily.         . clopidogrel (PLAVIX) 75 MG tablet   Oral   Take 75 mg by mouth daily.         . hydroxyurea (HYDREA) 500 MG capsule   Oral   Take 500 mg by mouth 3 (three) times a week. Pt takes on Monday, Wednesday, and Friday.         . potassium chloride (K-DUR) 10 MEQ tablet   Oral   Take 10 mEq by mouth daily.         Marland Kitchen  traMADol (ULTRAM) 50 MG tablet   Oral   Take 1 tablet (50 mg total) by mouth every evening.   30 tablet   0   . acetaminophen (TYLENOL) 325 MG tablet   Oral   Take 2 tablets (650 mg total) by mouth every 6 (six) hours as needed for mild pain (or Fever >/= 101).         . furosemide (LASIX) 20 MG tablet   Oral   Take 20 mg by mouth daily.         . magnesium oxide (MAG-OX) 400 MG tablet      TAKE 1 TABLET(400 MG) BY MOUTH EVERY DAY           Allergies Citalopram; Pneumococcal vaccine; Pneumovax; Requip; and Penicillins  Family History  Problem Relation Age of Onset  . CVA Sister    . Heart attack Brother   . Other Mother     Old age  . Other Father     Old age    Social History Social History  Substance Use Topics  . Smoking status: Current Every Day Smoker -- 0.50 packs/day    Types: Cigarettes  . Smokeless tobacco: Never Used  . Alcohol Use: No    Review of Systems  Constitutional: No fever/chills. Eyes: No visual changes. ENT: No sore throat. Cardiovascular: Denies chest pain. Respiratory: Positive for nonproductive cough. Denies shortness of breath. Gastrointestinal: No abdominal pain.  Positive for nausea, no vomiting.  No diarrhea.  No constipation. Genitourinary: Negative for dysuria. Musculoskeletal: Positive for left hip pain. Negative for back pain. Skin: Negative for rash. Neurological: Negative for headaches, focal weakness or numbness.  10-point ROS otherwise negative.  ____________________________________________   PHYSICAL EXAM:  VITAL SIGNS: ED Triage Vitals  Enc Vitals Group     BP 11/30/15 0302 131/46 mmHg     Pulse Rate 11/30/15 0302 93     Resp 11/30/15 0302 18     Temp --      Temp src --      SpO2 11/30/15 0302 94 %     Weight 11/30/15 0302 158 lb (71.668 kg)     Height 11/30/15 0302 5\' 5"  (1.651 m)     Head Cir --      Peak Flow --      Pain Score --      Pain Loc --      Pain Edu? --      Excl. in [place]? --     Constitutional: Alert and oriented. Well appearing and in mild acute distress. Eyes: Conjunctivae are normal. PERRL. EOMI. Head: Atraumatic. Nose: No congestion/rhinnorhea. Mouth/Throat: Mucous membranes are moist.  Oropharynx non-erythematous. Neck: No stridor.   Cardiovascular: Normal rate, regular rhythm. Grossly normal heart sounds.  Good peripheral circulation. Respiratory: Normal respiratory effort.  No retractions. Lungs slightly diminished bibasilarly, otherwise CTAB. Gastrointestinal: Soft and nontender. No distention. No abdominal bruits. No CVA tenderness. Musculoskeletal: No lower  extremity tenderness nor edema.  No shortening or rotation. No joint effusions. Neurologic:  Normal speech and language. No gross focal neurologic deficits are appreciated.  Skin:  Skin is hot, dry and intact. No rash noted. Psychiatric: Mood and affect are normal. Speech and behavior are normal.  ____________________________________________   LABS (all labs ordered are listed, but only abnormal results are displayed)  Labs Reviewed  COMPREHENSIVE METABOLIC PANEL - Abnormal; Notable for the following:    Glucose, Bld 145 (*)    Creatinine, Ser 1.02 (*)  Calcium 8.7 (*)    Total Protein 6.3 (*)    Albumin 3.4 (*)    ALT 12 (*)    GFR calc non Af Amer 47 (*)    GFR calc Af Amer 54 (*)    All other components within normal limits  URINALYSIS COMPLETEWITH MICROSCOPIC ([facility] ONLY) - Abnormal; Notable for the following:    Color, Urine YELLOW (*)    APPearance CLEAR (*)    Ketones, ur TRACE (*)    Hgb urine dipstick 2+ (*)    Protein, ur 30 (*)    Squamous Epithelial / LPF 0-5 (*)    All other components within normal limits  CBC WITH DIFFERENTIAL/PLATELET - Abnormal; Notable for the following:    RDW 16.9 (*)    Lymphs Abs 0.4 (*)    All other components within normal limits  RAPID INFLUENZA A&B ANTIGENS ([facility] ONLY)  CULTURE, BLOOD (ROUTINE X 2)  CULTURE, BLOOD (ROUTINE X 2)  URINE CULTURE  TROPONIN I  LACTIC ACID, PLASMA  LACTIC ACID, PLASMA  CBC WITH DIFFERENTIAL/PLATELET   ____________________________________________  EKG  ED ECG REPORT I, Micalah Cabezas J, the attending physician, personally viewed and interpreted this ECG.   Date: 11/30/2015  EKG Time: 0300  Rate: 94  Rhythm: normal EKG, normal sinus rhythm  Axis: Normal  Intervals:none  ST&T Change: Nonspecific  ____________________________________________  RADIOLOGY  Portable chest x-ray (viewed by me, interpreted per Dr. Quintella Reichert): No definite acute/traumatic thoracic pathology.  Left hip with pelvis  x-rays (viewed by me, interpreted per Dr. Quintella Reichert): No definite acute fracture or dislocation. ____________________________________________   PROCEDURES  Procedure(s) performed: None  Critical Care performed: No  ____________________________________________   INITIAL IMPRESSION / ASSESSMENT AND PLAN / ED COURSE  Pertinent labs & imaging results that were available during my care of the patient were reviewed by me and considered in my medical decision making (see chart for details).  82 year old female who presents s/p fall with reports of left hip pain initially, none currently. Patient is afebrile on arrival. Per EMS, nursing facility currently has an epidemic of influenza. Given patient's history of COPD, nonproductive cough, will initiate sepsis workup. Will also obtain x-ray imaging studies of her left hip.  ----------------------------------------- 5:12 AM on 11/30/2015 -----------------------------------------  Patient sleeping in no acute distress. Currently afebrile. Laboratory and imaging studies unremarkable. Negative influenza. Negative lactate. Given patient's age and initial temperature of 101F, will obtain repeat 3 hour lactate.  ----------------------------------------- 7:12 AM on 11/30/2015 -----------------------------------------  Repeat lactic 1.0; patient impatient for discharge. Blood and urine cultures are pending. Do not see cause to start antibiotics nor hospitalization. Strict return precautions given. Patient verbalizes understanding and agrees with plan of care. ____________________________________________   FINAL CLINICAL IMPRESSION(S) / ED DIAGNOSES  Final diagnoses:  Fever, unspecified fever cause      Paulette Blanch, MD 11/30/15 [id number]

## 2015-12-02 LAB — URINE CULTURE
Culture: NO GROWTH
Special Requests: NORMAL

## 2015-12-05 LAB — CULTURE, BLOOD (ROUTINE X 2)
CULTURE: NO GROWTH
CULTURE: NO GROWTH

## 2016-01-07 ENCOUNTER — Emergency Department
Admission: EM | Admit: 2016-01-07 | Discharge: 2016-01-08 | Disposition: A | Discharge status: Home / Self Care | Payer: Medicare Other | Attending: Emergency Medicine | Admitting: Emergency Medicine

## 2016-01-07 ENCOUNTER — Emergency Department: Payer: Medicare Other

## 2016-01-07 DIAGNOSIS — Z8673 Personal history of transient ischemic attack (TIA), and cerebral infarction without residual deficits: Secondary | ICD-10-CM | POA: Insufficient documentation

## 2016-01-07 DIAGNOSIS — M7989 Other specified soft tissue disorders: Secondary | ICD-10-CM | POA: Diagnosis present

## 2016-01-07 DIAGNOSIS — Z96659 Presence of unspecified artificial knee joint: Secondary | ICD-10-CM | POA: Diagnosis not present

## 2016-01-07 DIAGNOSIS — F17210 Nicotine dependence, cigarettes, uncomplicated: Secondary | ICD-10-CM | POA: Diagnosis not present

## 2016-01-07 DIAGNOSIS — Z8679 Personal history of other diseases of the circulatory system: Secondary | ICD-10-CM | POA: Diagnosis not present

## 2016-01-07 DIAGNOSIS — E785 Hyperlipidemia, unspecified: Secondary | ICD-10-CM | POA: Diagnosis not present

## 2016-01-07 DIAGNOSIS — R609 Edema, unspecified: Secondary | ICD-10-CM | POA: Diagnosis not present

## 2016-01-07 DIAGNOSIS — Z880 Allergy status to penicillin: Secondary | ICD-10-CM | POA: Insufficient documentation

## 2016-01-07 DIAGNOSIS — J449 Chronic obstructive pulmonary disease, unspecified: Secondary | ICD-10-CM | POA: Diagnosis not present

## 2016-01-07 LAB — CBC
HEMATOCRIT: 40.3 % (ref 35.0–47.0)
HEMOGLOBIN: 13.7 g/dL (ref 12.0–16.0)
MCH: 30.9 pg (ref 26.0–34.0)
MCHC: 34 g/dL (ref 32.0–36.0)
MCV: 90.8 fL (ref 80.0–100.0)
Platelets: 380 10*3/uL (ref 150–440)
RBC: 4.43 MIL/uL (ref 3.80–5.20)
RDW: 19 % — AB (ref 11.5–14.5)
WBC: 5.4 10*3/uL (ref 3.6–11.0)

## 2016-01-07 LAB — BASIC METABOLIC PANEL
ANION GAP: 7 (ref 5–15)
BUN: 9 mg/dL (ref 6–20)
CALCIUM: 8.9 mg/dL (ref 8.9–10.3)
CO2: 30 mmol/L (ref 22–32)
Chloride: 101 mmol/L (ref 101–111)
Creatinine, Ser: 0.93 mg/dL (ref 0.44–1.00)
GFR, EST NON AFRICAN AMERICAN: 53 mL/min — AB (ref >60)
Glucose, Bld: 102 mg/dL — ABNORMAL HIGH (ref 65–99)
Potassium: 3.2 mmol/L — ABNORMAL LOW (ref 3.5–5.1)
Sodium: 138 mmol/L (ref 135–145)

## 2016-01-07 LAB — TROPONIN I: Troponin I: <0.03 ng/mL (ref <0.031)

## 2016-01-07 LAB — BRAIN NATRIURETIC PEPTIDE: B NATRIURETIC PEPTIDE 5: 219 pg/mL — AB (ref 0.0–100.0)

## 2016-01-07 NOTE — Discharge Instructions (Signed)
Peripheral Edema You have swelling in your legs (peripheral edema). This swelling is due to excess accumulation of salt and water in your body. Edema may be a sign of heart, kidney or liver disease, or a side effect of a medication. It may also be due to problems in the leg veins. Elevating your legs and using special support stockings may be very helpful, if the cause of the swelling is due to poor venous circulation. Avoid long periods of standing, whatever the cause. Treatment of edema depends on identifying the cause. Chips, pretzels, pickles and other salty foods should be avoided. Restricting salt in your diet is almost always needed. Water pills (diuretics) are often used to remove the excess salt and water from your body via urine. These medicines prevent the kidney from reabsorbing sodium. This increases urine flow. Diuretic treatment may also result in lowering of potassium levels in your body. Potassium supplements may be needed if you have to use diuretics daily. Daily weights can help you keep track of your progress in clearing your edema. You should call your caregiver for follow up care as recommended. SEEK IMMEDIATE MEDICAL CARE IF:   You have increased swelling, pain, redness, or heat in your legs.  You develop shortness of breath, especially when lying down.  You develop chest or abdominal pain, weakness, or fainting.  You have a fever.   This information is not intended to replace advice given to you by your health care provider. Make sure you discuss any questions you have with your health care provider.   Document Released: 10/01/2004 Document Revised: 11/16/2011 Document Reviewed: 03/06/2015 Elsevier Interactive Patient Education 2016 [place].   Please increase the Lasix take 2 pills in the morning one in the evening. Try to keep your legs up as much as possible. Keep a dressing on that ankle while it's oozing. I would try to get your doctor to see tomorrow or the next  day and get something called a UNA boot. Sometimes that and zinc oxide ointment will help a lot with the oozing. I was trying to get one here but I cannot get one tonight. Please see your doctor or return if you gets short of breath if you have any redness or fever develop or if the oozing gets worse.

## 2016-01-07 NOTE — ED Notes (Signed)
Pt in with co pedal edema for over a week and yest noted right ankle had some weeping. Clear fluid noted to skin on right ankle but no open area to skin.  Pt denies any pain or shob.

## 2016-01-07 NOTE — ED Provider Notes (Signed)
[facility] Emergency Department Provider Note   ____________________________________________  Time seen: Approximately 11:14 PM  I have reviewed the triage vital signs and the nursing notes.   HISTORY  Chief Complaint Leg Swelling   HPI Krista Ortiz is a 82 y.o. female patient and family report increasing leg swelling. Had gone down on the Lasix was increased but it's back up again she says her right leg is "losing a little bit of fluid. She has no chest pain fever cough shortness of breath or any other complaints.   Past Medical History  Diagnosis Date  . Chronic back pain   . Hyperlipidemia   . Polycythemia   . COPD (chronic obstructive pulmonary disease) ([place])   . TIA (transient ischemic attack)   . PVD (peripheral vascular disease) ([place])   . Port-a-cath in place   . Polycythemia, secondary 04/26/2015  . Acoustic neuroma [facility])     md notes says history of acoustic neuroma or meningioma followed up by yearly MRI    Patient Active Problem List   Diagnosis Date Noted  . Back ache 10/03/2015  . Chronic obstructive pulmonary disease ([place]) 10/03/2015  . Dizziness 10/03/2015  . Decrease in the ability to hear 10/03/2015  . Neoplasm of meninges ([place]) 10/03/2015  . Combined fat and carbohydrate induced hyperlipemia 10/03/2015  . Neuroma 10/03/2015  . Impaired vision 10/03/2015  . Presence of other vascular implants and grafts 10/03/2015  . Current smoker 10/03/2015  . Temporary cerebral vascular dysfunction 10/03/2015  . Abnormal CBC 06/12/2015  . Chronic LBP 06/12/2015  . Polycythemia, secondary 04/26/2015  . Hypersensitivity to pneumococcal vaccine 02/21/2015  . Carotid artery narrowing 02/19/2015  . Sick sinus syndrome ([place]) 02/16/2015  . Syncope and collapse 02/15/2015  . Edema leg 10/25/2014  . TI (tricuspid incompetence) 10/25/2014  . Breathlessness on exertion 10/25/2014  . Chest pain at rest 10/03/2014  . Dyspnea, paroxysmal  nocturnal 10/03/2014  . Amnesia 04/02/2014  . Abnormal weight loss 04/02/2014  . Osteopenia 03/15/2014  . Chronic pain 07/06/2013  . Peripheral vascular disease ([place]) 06/27/2013  . Ejection murmur 12/09/2012    Past Surgical History  Procedure Laterality Date  . Back surgery    . Abdominal hysterectomy    . Tonsillectomy    . Pacemaker insertion Left 02/18/2015    Procedure: INSERTION PACEMAKER;  Surgeon: Isaias Cowman, MD;  Location: [facility] ORS;  Service: Cardiovascular;  Laterality: Left;  . Cholecystectomy    . Replacement total knee    . Cataracts    . Appendectomy      Current Outpatient Rx  Name  Route  Sig  Dispense  Refill  . acetaminophen (TYLENOL) 325 MG tablet   Oral   Take 650 mg by mouth every 6 (six) hours as needed.         Marland Kitchen albuterol (PROVENTIL) (2.5 MG/3ML) 0.083% nebulizer solution   Inhalation   Inhale 3 mLs into the lungs every 6 (six) hours as needed.      0   . atorvastatin (LIPITOR) 10 MG tablet   Oral   Take 10 mg by mouth daily.         . clopidogrel (PLAVIX) 75 MG tablet   Oral   Take 75 mg by mouth daily.         . furosemide (LASIX) 20 MG tablet   Oral   Take 20 mg by mouth daily.         . hydroxyurea (HYDREA) 500 MG capsule  Oral   Take 500 mg by mouth 3 (three) times a week. Pt takes on Monday, Wednesday, and Friday.         . magnesium oxide (MAG-OX) 400 MG tablet      TAKE 1 TABLET(400 MG) BY MOUTH EVERY DAY         . potassium chloride (K-DUR) 10 MEQ tablet   Oral   Take 10 mEq by mouth daily.         Marland Kitchen SPIRIVA HANDIHALER 18 MCG inhalation capsule   Inhalation   Place 1 capsule into inhaler and inhale daily.      2     Dispense as written.     Allergies Citalopram; Pneumococcal vaccine; Pneumovax; Requip; and Penicillins  Family History  Problem Relation Age of Onset  . CVA Sister   . Heart attack Brother   . Other Mother     Old age  . Other Father     Old age    Social  History Social History  Substance Use Topics  . Smoking status: Current Every Day Smoker -- 0.50 packs/day    Types: Cigarettes  . Smokeless tobacco: Never Used  . Alcohol Use: No    Review of Systems Constitutional: No fever/chills Eyes: No visual changes. ENT: No sore throat. Cardiovascular: Denies chest pain. Respiratory: Denies shortness of breath. Gastrointestinal: No abdominal pain.  No nausea, no vomiting.  No diarrhea.  No constipation. Genitourinary: Negative for dysuria. Musculoskeletal: Negative for back pain. Skin: Negative for rash. Neurological: Negative for headaches, focal weakness or numbness.  10-point ROS otherwise negative.  ____________________________________________   PHYSICAL EXAM:  VITAL SIGNS: ED Triage Vitals  Enc Vitals Group     BP 01/07/16 1913 157/64 mmHg     Pulse Rate 01/07/16 1913 78     Resp 01/07/16 1913 18     Temp 01/07/16 1913 98.6 F (37 C)     Temp Source 01/07/16 1913 Oral     SpO2 01/07/16 1913 96 %     Weight 01/07/16 1913 155 lb (70.308 kg)     Height 01/07/16 1913 5\' 5"  (1.651 m)     Head Cir --      Peak Flow --      Pain Score --      Pain Loc --      Pain Edu? --      Excl. in [place]? --     Constitutional: Alert and oriented. Well appearing and in no acute distress. Eyes: Conjunctivae are normal. PERRL. EOMI. Head: Atraumatic. Nose: No congestion/rhinnorhea. Mouth/Throat: Mucous membranes are moist.  Oropharynx non-erythematous. Neck: No stridor. Cardiovascular: Normal rate, regular rhythm. Grossly normal heart sounds.  Good peripheral circulation. Respiratory: Normal respiratory effort.  No retractions. Lungs CTAB. Gastrointestinal: Soft and nontender. No distention. No abdominal bruits. No CVA tenderness. Musculoskeletal: No lower extremity tenderness bilateral edema there is little bit of serous fluid oozing from her just above the ankle on the right legcellulitis.  No joint effusions. Neurologic:  Normal  speech and language. No gross focal neurologic deficits are appreciated. No gait instability. Skin:  See musculoskeletal Psychiatric: Mood and affect are normal. Speech and behavior are normal.  ____________________________________________   LABS (all labs ordered are listed, but only abnormal results are displayed)  Labs Reviewed  CBC - Abnormal; Notable for the following:    RDW 19.0 (*)    All other components within normal limits  BASIC METABOLIC PANEL - Abnormal; Notable for the following:  Potassium 3.2 (*)    Glucose, Bld 102 (*)    GFR calc non Af Amer 53 (*)    All other components within normal limits  BRAIN NATRIURETIC PEPTIDE - Abnormal; Notable for the following:    B Natriuretic Peptide 219.0 (*)    All other components within normal limits  TROPONIN I   ____________________________________________  EKG EKG read and interpreted by me shows normal sinus rhythm rate of 82 left axis no acuity ST-T changes ____________________________________________  RADIOLOGY  Chest x-ray shows no congestive failure per radiology ____________________________________________   PROCEDURES   ____________________________________________   INITIAL IMPRESSION / ASSESSMENT AND PLAN / ED COURSE  Pertinent labs & imaging results that were available during my care of the patient were reviewed by me and considered in my medical decision making (see chart for details).  ____________________________________________   FINAL CLINICAL IMPRESSION(S) / ED DIAGNOSES  Final diagnoses:  Edema, unspecified type      NEW MEDICATIONS STARTED DURING THIS VISIT:  New Prescriptions   No medications on file     Note:  This document was prepared using Dragon voice recognition software and may include unintentional dictation errors.    Nena Polio, MD 01/07/16 [phone]

## 2016-01-08 NOTE — ED Notes (Signed)
Unna boot applied to pt right foot/leg. Pt tolerated well. Instructed if pt notices increase in swelling to foot or leg, pain, numbness, change in color or temp to remove wraps immediately. Verbalized understanding.

## 2016-01-23 ENCOUNTER — Inpatient Hospital Stay: Payer: Medicare Other | Attending: Internal Medicine

## 2016-01-23 DIAGNOSIS — D751 Secondary polycythemia: Secondary | ICD-10-CM | POA: Diagnosis not present

## 2016-01-23 DIAGNOSIS — Z452 Encounter for adjustment and management of vascular access device: Secondary | ICD-10-CM | POA: Insufficient documentation

## 2016-01-23 DIAGNOSIS — D45 Polycythemia vera: Secondary | ICD-10-CM

## 2016-01-23 MED ORDER — HEPARIN SOD (PORK) LOCK FLUSH 100 UNIT/ML IV SOLN
INTRAVENOUS | Status: AC
  Filled 2016-01-23: qty 5

## 2016-01-23 MED ORDER — SODIUM CHLORIDE 0.9% FLUSH
10.0000 mL | Freq: Once | INTRAVENOUS | Status: AC
  Administered 2016-01-23: 10 mL via INTRAVENOUS
  Filled 2016-01-23: qty 10

## 2016-01-23 MED ORDER — HEPARIN SOD (PORK) LOCK FLUSH 100 UNIT/ML IV SOLN
500.0000 [IU] | Freq: Once | INTRAVENOUS | Status: AC
  Administered 2016-01-23: 500 [IU] via INTRAVENOUS

## 2016-02-02 ENCOUNTER — Emergency Department
Admission: EM | Admit: 2016-02-02 | Discharge: 2016-02-02 | Disposition: A | Discharge status: Home / Self Care | Payer: Medicare Other | Attending: Emergency Medicine | Admitting: Emergency Medicine

## 2016-02-02 ENCOUNTER — Emergency Department: Payer: Medicare Other

## 2016-02-02 DIAGNOSIS — Y939 Activity, unspecified: Secondary | ICD-10-CM | POA: Insufficient documentation

## 2016-02-02 DIAGNOSIS — Z950 Presence of cardiac pacemaker: Secondary | ICD-10-CM | POA: Insufficient documentation

## 2016-02-02 DIAGNOSIS — E785 Hyperlipidemia, unspecified: Secondary | ICD-10-CM | POA: Insufficient documentation

## 2016-02-02 DIAGNOSIS — W0110XA Fall on same level from slipping, tripping and stumbling with subsequent striking against unspecified object, initial encounter: Secondary | ICD-10-CM | POA: Diagnosis not present

## 2016-02-02 DIAGNOSIS — S62101A Fracture of unspecified carpal bone, right wrist, initial encounter for closed fracture: Secondary | ICD-10-CM

## 2016-02-02 DIAGNOSIS — J449 Chronic obstructive pulmonary disease, unspecified: Secondary | ICD-10-CM | POA: Insufficient documentation

## 2016-02-02 DIAGNOSIS — Z8673 Personal history of transient ischemic attack (TIA), and cerebral infarction without residual deficits: Secondary | ICD-10-CM | POA: Diagnosis not present

## 2016-02-02 DIAGNOSIS — F17210 Nicotine dependence, cigarettes, uncomplicated: Secondary | ICD-10-CM | POA: Insufficient documentation

## 2016-02-02 DIAGNOSIS — Z96653 Presence of artificial knee joint, bilateral: Secondary | ICD-10-CM | POA: Diagnosis not present

## 2016-02-02 DIAGNOSIS — Y92039 Unspecified place in apartment as the place of occurrence of the external cause: Secondary | ICD-10-CM | POA: Diagnosis not present

## 2016-02-02 DIAGNOSIS — Z8679 Personal history of other diseases of the circulatory system: Secondary | ICD-10-CM | POA: Diagnosis not present

## 2016-02-02 DIAGNOSIS — Z79899 Other long term (current) drug therapy: Secondary | ICD-10-CM | POA: Diagnosis not present

## 2016-02-02 DIAGNOSIS — S6291XA Unspecified fracture of right wrist and hand, initial encounter for closed fracture: Secondary | ICD-10-CM | POA: Insufficient documentation

## 2016-02-02 DIAGNOSIS — Y999 Unspecified external cause status: Secondary | ICD-10-CM | POA: Insufficient documentation

## 2016-02-02 DIAGNOSIS — S6991XA Unspecified injury of right wrist, hand and finger(s), initial encounter: Secondary | ICD-10-CM | POA: Diagnosis present

## 2016-02-02 LAB — CBC
HCT: 41.3 % (ref 35.0–47.0)
Hemoglobin: 13.7 g/dL (ref 12.0–16.0)
MCH: 29.9 pg (ref 26.0–34.0)
MCHC: 33.2 g/dL (ref 32.0–36.0)
MCV: 89.9 fL (ref 80.0–100.0)
PLATELETS: 313 10*3/uL (ref 150–440)
RBC: 4.6 MIL/uL (ref 3.80–5.20)
RDW: 17.6 % — ABNORMAL HIGH (ref 11.5–14.5)
WBC: 4.8 10*3/uL (ref 3.6–11.0)

## 2016-02-02 LAB — BASIC METABOLIC PANEL
ANION GAP: 6 (ref 5–15)
BUN: 9 mg/dL (ref 6–20)
CALCIUM: 8.8 mg/dL — AB (ref 8.9–10.3)
CO2: 32 mmol/L (ref 22–32)
CREATININE: 1 mg/dL (ref 0.44–1.00)
Chloride: 103 mmol/L (ref 101–111)
GFR, EST AFRICAN AMERICAN: 56 mL/min — AB (ref >60)
GFR, EST NON AFRICAN AMERICAN: 48 mL/min — AB (ref >60)
Glucose, Bld: 89 mg/dL (ref 65–99)
Potassium: 2.9 mmol/L — CL (ref 3.5–5.1)
SODIUM: 141 mmol/L (ref 135–145)

## 2016-02-02 LAB — TROPONIN I

## 2016-02-02 MED ORDER — POTASSIUM CHLORIDE CRYS ER 20 MEQ PO TBCR
40.0000 meq | EXTENDED_RELEASE_TABLET | Freq: Once | ORAL | Status: AC
  Administered 2016-02-02: 40 meq via ORAL
  Filled 2016-02-02: qty 2

## 2016-02-02 MED ORDER — ACETAMINOPHEN 325 MG PO TABS
650.0000 mg | ORAL_TABLET | Freq: Once | ORAL | Status: AC
  Administered 2016-02-02: 650 mg via ORAL
  Filled 2016-02-02: qty 2

## 2016-02-02 NOTE — Discharge Instructions (Signed)
Wrist Fracture °A wrist fracture is a break or crack in one of the bones of your wrist. Your wrist is made up of eight small bones at the palm of your hand (carpal bones) and two long bones that make up your forearm (radius and ulna). °CAUSES °· A direct blow to the wrist. °· Falling on an outstretched hand. °· Trauma, such as a car accident or a fall. °RISK FACTORS °Risk factors for wrist fracture include: °· Participating in contact and high-risk sports, such as skiing, biking, and ice skating. °· Taking steroid medicines. °· Smoking. °· Being female. °· Being Caucasian. °· Drinking more than three alcoholic beverages per day. °· Having low or lowered bone density (osteoporosis or osteopenia). °· Age. Older adults have decreased bone density. °· Women who have had menopause. °· History of previous fractures. °SIGNS AND SYMPTOMS °Symptoms of wrist fractures include tenderness, bruising, and inflammation. Additionally, the wrist may hang in an odd position or appear deformed. °DIAGNOSIS °Diagnosis may include: °· Physical exam. °· X-ray. °TREATMENT °Treatment depends on many factors, including the nature and location of the fracture, your age, and your activity level. Treatment for wrist fracture can be nonsurgical or surgical. °Nonsurgical Treatment °A plaster cast or splint may be applied to your wrist if the bone is in a good position. If the fracture is not in good position, it may be necessary for your health care provider to realign it before applying a splint or cast. Usually, a cast or splint will be worn for several weeks. °Surgical Treatment °Sometimes the position of the bone is so far out of place that surgery is required to apply a device to hold it together as it heals. Depending on the fracture, there are a number of options for holding the bone in place while it heals, such as a cast and metal pins. °HOME CARE INSTRUCTIONS °· Keep your injured wrist elevated and move your fingers as much as  possible. °· Do not put pressure on any part of your cast or splint. It may break. °· Use a plastic bag to protect your cast or splint from water while bathing or showering. Do not lower your cast or splint into water. °· Take medicines only as directed by your health care provider. °· Keep your cast or splint clean and dry. If it becomes wet, damaged, or suddenly feels too tight, contact your health care provider right away. °· Do not use any tobacco products including cigarettes, chewing tobacco, or electronic cigarettes. Tobacco can delay bone healing. If you need help quitting, ask your health care provider. °· Keep all follow-up visits as directed by your health care provider. This is important. °· Ask your health care provider if you should take supplements of calcium and vitamins C and D to promote bone healing. °SEEK MEDICAL CARE IF: °· Your cast or splint is damaged, breaks, or gets wet. °· You have a fever. °· You have chills. °· You have continued severe pain or more swelling than you did before the cast was put on. °SEEK IMMEDIATE MEDICAL CARE IF: °· Your hand or fingernails on the injured arm turn blue or gray, or feel cold or numb. °· You have decreased feeling in the fingers of your injured arm. °MAKE SURE YOU: °· Understand these instructions. °· Will watch your condition. °· Will get help right away if you are not doing well or get worse. °  °This information is not intended to replace advice given to you by your   health care provider. Make sure you discuss any questions you have with your health care provider.   Document Released: 06/03/2005 Document Revised: 05/15/2015 Document Reviewed: 09/11/2011 Elsevier Interactive Patient Education 2016 [employer]   Please call Dr. by Roland Rack, the orthopedic doctor, later this week. I would call after 9 AM on Tuesday. Please return for increased pain any numbness or any other problems. Keep the arm elevated. He can use ice wrapped in a towel 20 minutes  every hour or so as needed for the pain. Do not fall asleep with the ice on it can give you frostbite.

## 2016-02-02 NOTE — ED Provider Notes (Signed)
[facility] Emergency Department Provider Note   ____________________________________________  Time seen: Approximately 1:10 PM  I have reviewed the triage vital signs and the nursing notes.   HISTORY  Chief Complaint Wrist Injury    HPI Krista Ortiz is a 82 y.o. female patient reports she sat on the edge of her recliner as it was sticking out. It tipped over and threw her she landed on the ground hitting her wrist. She reports she did not pass out she did not hit her head she has no pain anywhere except for her wrist. This occurred yesterday. Pain is moderate but bearable. He did not want any pain medicine. I had to talk her into taking some Tylenol.   Past Medical History  Diagnosis Date  . Chronic back pain   . Hyperlipidemia   . Polycythemia   . COPD (chronic obstructive pulmonary disease) ([place])   . TIA (transient ischemic attack)   . PVD (peripheral vascular disease) ([place])   . Port-a-cath in place   . Polycythemia, secondary 04/26/2015  . Acoustic neuroma [facility])     md notes says history of acoustic neuroma or meningioma followed up by yearly MRI    Patient Active Problem List   Diagnosis Date Noted  . Back ache 10/03/2015  . Chronic obstructive pulmonary disease ([place]) 10/03/2015  . Dizziness 10/03/2015  . Decrease in the ability to hear 10/03/2015  . Neoplasm of meninges ([place]) 10/03/2015  . Combined fat and carbohydrate induced hyperlipemia 10/03/2015  . Neuroma 10/03/2015  . Impaired vision 10/03/2015  . Presence of other vascular implants and grafts 10/03/2015  . Current smoker 10/03/2015  . Temporary cerebral vascular dysfunction 10/03/2015  . Abnormal CBC 06/12/2015  . Chronic LBP 06/12/2015  . Polycythemia, secondary 04/26/2015  . Hypersensitivity to pneumococcal vaccine 02/21/2015  . Carotid artery narrowing 02/19/2015  . Sick sinus syndrome ([place]) 02/16/2015  . Syncope and collapse 02/15/2015  . Edema leg 10/25/2014  .  TI (tricuspid incompetence) 10/25/2014  . Breathlessness on exertion 10/25/2014  . Chest pain at rest 10/03/2014  . Dyspnea, paroxysmal nocturnal 10/03/2014  . Amnesia 04/02/2014  . Abnormal weight loss 04/02/2014  . Osteopenia 03/15/2014  . Chronic pain 07/06/2013  . Peripheral vascular disease ([place]) 06/27/2013  . Ejection murmur 12/09/2012    Past Surgical History  Procedure Laterality Date  . Back surgery    . Abdominal hysterectomy    . Tonsillectomy    . Pacemaker insertion Left 02/18/2015    Procedure: INSERTION PACEMAKER;  Surgeon: Isaias Cowman, MD;  Location: [facility] ORS;  Service: Cardiovascular;  Laterality: Left;  . Cholecystectomy    . Replacement total knee    . Cataracts    . Appendectomy      Current Outpatient Rx  Name  Route  Sig  Dispense  Refill  . acetaminophen (TYLENOL) 325 MG tablet   Oral   Take 650 mg by mouth every 6 (six) hours as needed.         Marland Kitchen albuterol (PROVENTIL) (2.5 MG/3ML) 0.083% nebulizer solution   Inhalation   Inhale 3 mLs into the lungs every 6 (six) hours as needed.      0   . atorvastatin (LIPITOR) 10 MG tablet   Oral   Take 10 mg by mouth daily.         . clopidogrel (PLAVIX) 75 MG tablet   Oral   Take 75 mg by mouth daily.         Marland Kitchen  furosemide (LASIX) 20 MG tablet   Oral   Take 20 mg by mouth daily.         . hydroxyurea (HYDREA) 500 MG capsule   Oral   Take 500 mg by mouth 3 (three) times a week. Pt takes on Monday, Wednesday, and Friday.         . magnesium oxide (MAG-OX) 400 MG tablet      TAKE 1 TABLET(400 MG) BY MOUTH EVERY DAY         . potassium chloride (K-DUR) 10 MEQ tablet   Oral   Take 10 mEq by mouth daily.         Marland Kitchen SPIRIVA HANDIHALER 18 MCG inhalation capsule   Inhalation   Place 1 capsule into inhaler and inhale daily.      2     Dispense as written.     Allergies Citalopram; Pneumococcal vaccine; Pneumovax; Requip; and Penicillins  Family History  Problem Relation  Age of Onset  . CVA Sister   . Heart attack Brother   . Other Mother     Old age  . Other Father     Old age    Social History Social History  Substance Use Topics  . Smoking status: Current Every Day Smoker -- 0.50 packs/day    Types: Cigarettes  . Smokeless tobacco: Never Used  . Alcohol Use: No    Review of Systems Constitutional: No fever/chills Eyes: No visual changes. ENT: No sore throat. Cardiovascular: Denies chest pain. Respiratory: Denies shortness of breath. Gastrointestinal: No abdominal pain.  No nausea, no vomiting.  No diarrhea.  No constipation. Genitourinary: Negative for dysuria. Musculoskeletal: Negative for back pain. Skin: Negative for rash. Neurological: Negative for headaches, focal weakness or numbness.  10-point ROS otherwise negative.  ____________________________________________   PHYSICAL EXAM:  VITAL SIGNS: ED Triage Vitals  Enc Vitals Group     BP 02/02/16 1126 94/39 mmHg     Pulse Rate 02/02/16 1126 84     Resp 02/02/16 1126 18     Temp 02/02/16 1126 97.7 F (36.5 C)     Temp Source 02/02/16 1126 Oral     SpO2 02/02/16 1126 100 %     Weight 02/02/16 1126 150 lb (68.04 kg)     Height 02/02/16 1126 5\' 5"  (1.651 m)     Head Cir --      Peak Flow --      Pain Score 02/02/16 1142 5     Pain Loc --      Pain Edu? --      Excl. in [place]? --     Constitutional: Alert and oriented. Well appearing and in no acute distress. Eyes: Conjunctivae are normal. PERRL. EOMI. Head: Atraumatic. Nose: No congestion/rhinnorhea. Mouth/Throat: Mucous membranes are moist.  Oropharynx non-erythematous. Neck: No stridor. No cervical spine tenderness to palpation. Cardiovascular: Normal rate, regular rhythm. Grossly normal heart sounds.  Good peripheral circulation. Respiratory: Normal respiratory effort.  No retractions. Lungs CTAB. Gastrointestinal: Soft and nontender. No distention. No abdominal bruits. No CVA tenderness. Musculoskeletal: No lower  extremity tenderness trace edema.  No joint effusions. Neurologic:  Normal speech and language. No gross focal neurologic deficits are appreciated. No gait instability. Skin:  Skin is warm, dry and intact. No rash noted.   ____________________________________________   LABS (all labs ordered are listed, but only abnormal results are displayed)  Labs Reviewed  BASIC METABOLIC PANEL - Abnormal; Notable for the following:    Potassium 2.9 (*)  Calcium 8.8 (*)    GFR calc non Af Amer 48 (*)    GFR calc Af Amer 56 (*)    All other components within normal limits  CBC - Abnormal; Notable for the following:    RDW 17.6 (*)    All other components within normal limits  TROPONIN I  URINALYSIS COMPLETEWITH MICROSCOPIC ([facility] ONLY)   ____________________________________________  EKG  EKG read and interpreted by me shows normal sinus rhythm rate of 79 left axis decreased R-wave progression but no acute ST-T wave changes ____________________________________________  RADIOLOGY  X-ray read by radiology reviewed by me shows impacted fracture of the distal radius. Minimally angulated not displaced. ____________________________________________   PROCEDURES    ____________________________________________   INITIAL IMPRESSION / ASSESSMENT AND PLAN / ED COURSE  Pertinent labs & imaging results that were available during my care of the patient were reviewed by me and considered in my medical decision making (see chart for details).  Tech apply splinted looks like it's good. Patient has good range of motion of the fingers ____________________________________________   FINAL CLINICAL IMPRESSION(S) / ED DIAGNOSES  Final diagnoses:  Wrist fracture, right, closed, initial encounter      NEW MEDICATIONS STARTED DURING THIS VISIT:  New Prescriptions   No medications on file     Note:  This document was prepared using Dragon voice recognition software and may include  unintentional dictation errors.    Nena Polio, MD 02/02/16 [phone]

## 2016-02-02 NOTE — ED Notes (Signed)
Pt and Pt's son verbalized understanding of discharge instructions. NAD at this time. 

## 2016-02-02 NOTE — ED Notes (Signed)
Pt reports falling in her apartment yesterday pt states she was dizzy and fell. Now with pain to right wrist. Denies feeling dizzy at this time.

## 2016-03-19 ENCOUNTER — Inpatient Hospital Stay: Payer: Medicare Other | Attending: Internal Medicine

## 2016-03-19 DIAGNOSIS — M549 Dorsalgia, unspecified: Secondary | ICD-10-CM | POA: Insufficient documentation

## 2016-03-19 DIAGNOSIS — Z8673 Personal history of transient ischemic attack (TIA), and cerebral infarction without residual deficits: Secondary | ICD-10-CM | POA: Insufficient documentation

## 2016-03-19 DIAGNOSIS — E785 Hyperlipidemia, unspecified: Secondary | ICD-10-CM | POA: Insufficient documentation

## 2016-03-19 DIAGNOSIS — I739 Peripheral vascular disease, unspecified: Secondary | ICD-10-CM | POA: Insufficient documentation

## 2016-03-19 DIAGNOSIS — F17210 Nicotine dependence, cigarettes, uncomplicated: Secondary | ICD-10-CM | POA: Insufficient documentation

## 2016-03-19 DIAGNOSIS — J449 Chronic obstructive pulmonary disease, unspecified: Secondary | ICD-10-CM | POA: Insufficient documentation

## 2016-03-19 DIAGNOSIS — D751 Secondary polycythemia: Secondary | ICD-10-CM | POA: Insufficient documentation

## 2016-03-19 DIAGNOSIS — M7989 Other specified soft tissue disorders: Secondary | ICD-10-CM | POA: Insufficient documentation

## 2016-03-19 DIAGNOSIS — Z79899 Other long term (current) drug therapy: Secondary | ICD-10-CM | POA: Insufficient documentation

## 2016-04-02 ENCOUNTER — Inpatient Hospital Stay: Payer: Medicare Other

## 2016-04-02 ENCOUNTER — Inpatient Hospital Stay (HOSPITAL_BASED_OUTPATIENT_CLINIC_OR_DEPARTMENT_OTHER): Payer: Medicare Other | Admitting: Internal Medicine

## 2016-04-02 ENCOUNTER — Encounter: Payer: Self-pay | Admitting: *Deleted

## 2016-04-02 VITALS — BP 104/64 | HR 80 | Temp 98.8°F | Resp 18 | Wt 151.4 lb

## 2016-04-02 DIAGNOSIS — Z95828 Presence of other vascular implants and grafts: Secondary | ICD-10-CM

## 2016-04-02 DIAGNOSIS — E785 Hyperlipidemia, unspecified: Secondary | ICD-10-CM

## 2016-04-02 DIAGNOSIS — F17210 Nicotine dependence, cigarettes, uncomplicated: Secondary | ICD-10-CM

## 2016-04-02 DIAGNOSIS — Z8673 Personal history of transient ischemic attack (TIA), and cerebral infarction without residual deficits: Secondary | ICD-10-CM | POA: Diagnosis not present

## 2016-04-02 DIAGNOSIS — J449 Chronic obstructive pulmonary disease, unspecified: Secondary | ICD-10-CM | POA: Diagnosis not present

## 2016-04-02 DIAGNOSIS — M549 Dorsalgia, unspecified: Secondary | ICD-10-CM | POA: Diagnosis not present

## 2016-04-02 DIAGNOSIS — D751 Secondary polycythemia: Secondary | ICD-10-CM | POA: Diagnosis not present

## 2016-04-02 DIAGNOSIS — M7989 Other specified soft tissue disorders: Secondary | ICD-10-CM

## 2016-04-02 DIAGNOSIS — I739 Peripheral vascular disease, unspecified: Secondary | ICD-10-CM

## 2016-04-02 DIAGNOSIS — Z79899 Other long term (current) drug therapy: Secondary | ICD-10-CM | POA: Diagnosis not present

## 2016-04-02 LAB — COMPREHENSIVE METABOLIC PANEL
ALK PHOS: 100 U/L (ref 38–126)
ALT: 11 U/L — AB (ref 14–54)
ANION GAP: 8 (ref 5–15)
AST: 18 U/L (ref 15–41)
Albumin: 3.5 g/dL (ref 3.5–5.0)
BILIRUBIN TOTAL: 0.5 mg/dL (ref 0.3–1.2)
BUN: 9 mg/dL (ref 6–20)
CALCIUM: 8.8 mg/dL — AB (ref 8.9–10.3)
CO2: 29 mmol/L (ref 22–32)
CREATININE: 0.96 mg/dL (ref 0.44–1.00)
Chloride: 102 mmol/L (ref 101–111)
GFR calc non Af Amer: 51 mL/min — ABNORMAL LOW (ref >60)
GFR, EST AFRICAN AMERICAN: 59 mL/min — AB (ref >60)
Glucose, Bld: 102 mg/dL — ABNORMAL HIGH (ref 65–99)
Potassium: 3.4 mmol/L — ABNORMAL LOW (ref 3.5–5.1)
SODIUM: 139 mmol/L (ref 135–145)
TOTAL PROTEIN: 6.7 g/dL (ref 6.5–8.1)

## 2016-04-02 LAB — CBC WITH DIFFERENTIAL/PLATELET
Basophils Absolute: 0.1 10*3/uL (ref 0–0.1)
Basophils Relative: 1 %
Eosinophils Absolute: 0.1 10*3/uL (ref 0–0.7)
Eosinophils Relative: 1 %
HEMATOCRIT: 41.7 % (ref 35.0–47.0)
HEMOGLOBIN: 14.2 g/dL (ref 12.0–16.0)
LYMPHS ABS: 1.6 10*3/uL (ref 1.0–3.6)
LYMPHS PCT: 25 %
MCH: 30.7 pg (ref 26.0–34.0)
MCHC: 34.1 g/dL (ref 32.0–36.0)
MCV: 89.9 fL (ref 80.0–100.0)
MONOS PCT: 12 %
Monocytes Absolute: 0.8 10*3/uL (ref 0.2–0.9)
NEUTROS ABS: 3.8 10*3/uL (ref 1.4–6.5)
NEUTROS PCT: 61 %
Platelets: 310 10*3/uL (ref 150–440)
RBC: 4.65 MIL/uL (ref 3.80–5.20)
RDW: 16.2 % — ABNORMAL HIGH (ref 11.5–14.5)
WBC: 6.3 10*3/uL (ref 3.6–11.0)

## 2016-04-02 MED ORDER — SODIUM CHLORIDE 0.9% FLUSH
10.0000 mL | INTRAVENOUS | Status: DC | PRN
  Administered 2016-04-02: 10 mL via INTRAVENOUS
  Filled 2016-04-02: qty 10

## 2016-04-02 MED ORDER — HEPARIN SOD (PORK) LOCK FLUSH 100 UNIT/ML IV SOLN
500.0000 [IU] | Freq: Once | INTRAVENOUS | Status: AC
  Administered 2016-04-02: 500 [IU] via INTRAVENOUS

## 2016-04-02 NOTE — Progress Notes (Signed)
[facility] OFFICE PROGRESS NOTE  Patient Care Team: Hortencia Pilar, MD as PCP - General (Family Medicine)   SUMMARY OF ONCOLOGIC HISTORY:  # POLYCYTHEMIA & THROMBOCYTOSIS On Hydrea 500 three/week ? Secondary   # smoking  INTERVAL HISTORY:  82 year old Caucasian female patient with long-standing history of polycythemia/thrombocytosis on Hydrea; and long-standing history of smoking Is here for follow-up.    Patient denies any unusual weight loss denies any night sweats denies any fevers. No history of strokes.Patient is doing fairly well for her age.  Patient has mild to moderate swelling in the legs which is not any worse. She wears stockings.  She complains of mild back pain which is again chronic; not  any worse.   REVIEW OF SYSTEMS:  A complete 10 point review of system is done which is negative except mentioned above/history of present illness.   PAST MEDICAL HISTORY :  Past Medical History:  Diagnosis Date  . Acoustic neuroma [facility])    md notes says history of acoustic neuroma or meningioma followed up by yearly MRI  . Chronic back pain   . COPD (chronic obstructive pulmonary disease) ([place])   . Hyperlipidemia   . Polycythemia   . Polycythemia, secondary 04/26/2015  . Port-a-cath in place   . PVD (peripheral vascular disease) ([place])   . TIA (transient ischemic attack)     PAST SURGICAL HISTORY :   Past Surgical History:  Procedure Laterality Date  . ABDOMINAL HYSTERECTOMY    . APPENDECTOMY    . BACK SURGERY    . cataracts    . CHOLECYSTECTOMY    . PACEMAKER INSERTION Left 02/18/2015   Procedure: INSERTION PACEMAKER;  Surgeon: Isaias Cowman, MD;  Location: [facility] ORS;  Service: Cardiovascular;  Laterality: Left;  . REPLACEMENT TOTAL KNEE    . TONSILLECTOMY      FAMILY HISTORY :   Family History  Problem Relation Age of Onset  . CVA Sister   . Heart attack Brother   . Other Mother     Old age  . Other Father     Old age    SOCIAL  HISTORY:  She continues to live by herself in an assisted living. She still cooks/and drives at times.   Social History  Substance Use Topics  . Smoking status: Current Every Day Smoker    Packs/day: 0.50    Types: Cigarettes  . Smokeless tobacco: Never Used  . Alcohol use No    ALLERGIES:  is allergic to citalopram; pneumococcal vaccine; pneumovax [pneumococcal polysaccharide vaccine]; requip [ropinirole hcl]; and penicillins.  MEDICATIONS:  Current Outpatient Prescriptions  Medication Sig Dispense Refill  . acetaminophen (TYLENOL) 325 MG tablet Take 650 mg by mouth every 6 (six) hours as needed.    Marland Kitchen albuterol (PROVENTIL) (2.5 MG/3ML) 0.083% nebulizer solution Inhale 3 mLs into the lungs every 6 (six) hours as needed.  0  . atorvastatin (LIPITOR) 10 MG tablet Take 10 mg by mouth daily.    . clindamycin (CLEOCIN) 300 MG capsule Take by mouth.    . clopidogrel (PLAVIX) 75 MG tablet Take 75 mg by mouth daily.    . furosemide (LASIX) 20 MG tablet Take 20 mg by mouth daily.    . hydroxyurea (HYDREA) 500 MG capsule Take 500 mg by mouth 3 (three) times a week. Pt takes on Monday, Wednesday, and Friday.    . magnesium oxide (MAG-OX) 400 MG tablet TAKE 1 TABLET(400 MG) BY MOUTH EVERY DAY    . potassium chloride (  K-DUR) 10 MEQ tablet Take 10 mEq by mouth daily.    Marland Kitchen SPIRIVA HANDIHALER 18 MCG inhalation capsule Place 1 capsule into inhaler and inhale daily.  2   No current facility-administered medications for this visit.    Facility-Administered Medications Ordered in Other Visits  Medication Dose Route Frequency Provider Last Rate Last Dose  . sodium chloride flush (NS) 0.9 % injection 10 mL  10 mL Intravenous PRN Evlyn Kanner, NP   10 mL at 10/03/15 0958  . sodium chloride flush (NS) 0.9 % injection 10 mL  10 mL Intravenous PRN Cammie Sickle, MD   10 mL at 04/02/16 1417    PHYSICAL EXAMINATION: ECOG PERFORMANCE STATUS: 0 - Asymptomatic  BP 104/64 (BP Location: Left Arm,  Patient Position: Sitting)   Pulse 80   Temp 98.8 F (37.1 C) (Tympanic)   Resp 18   Wt 151 lb 6 oz (68.7 kg)   LMP  (LMP Unknown)   BMI 25.19 kg/m   Filed Weights   04/02/16 1432  Weight: 151 lb 6 oz (68.7 kg)    GENERAL: Well-nourished well-developed; Alert, no distress and comfortable.   Walks with a rolling walker. She is able to sit on the exam table with some help. EYES: no pallor or icterus OROPHARYNX: Poor dentition. NECK: supple, no masses felt LYMPH:  no palpable lymphadenopathy in the cervical, axillary or inguinal regions LUNGS: decreased breath sounds bilateral and  No wheeze or crackles HEART/CVS: regular rate & rhythm and no murmurs; mild bilateral lower extremity edema ABDOMEN:abdomen soft, non-tender and normal bowel sounds Musculoskeletal:no cyanosis of digits and no clubbing  PSYCH: alert & oriented x 3 with fluent speech NEURO: no focal motor/sensory deficits SKIN:  no rashes or significant lesions  LABORATORY DATA:  I have reviewed the data as listed    Component Value Date/Time   NA 139 04/02/2016 1419   NA 138 01/12/2014 1317   K 3.4 (L) 04/02/2016 1419   K 3.1 (L) 01/12/2014 1317   CL 102 04/02/2016 1419   CL 102 01/12/2014 1317   CO2 29 04/02/2016 1419   CO2 31 01/12/2014 1317   GLUCOSE 102 (H) 04/02/2016 1419   GLUCOSE 114 (H) 01/12/2014 1317   BUN 9 04/02/2016 1419   BUN 14 01/12/2014 1317   CREATININE 0.96 04/02/2016 1419   CREATININE 1.27 01/12/2014 1317   CALCIUM 8.8 (L) 04/02/2016 1419   CALCIUM 9.0 01/12/2014 1317   PROT 6.7 04/02/2016 1419   PROT 7.3 01/12/2014 1317   ALBUMIN 3.5 04/02/2016 1419   ALBUMIN 3.5 01/12/2014 1317   AST 18 04/02/2016 1419   AST 22 01/12/2014 1317   ALT 11 (L) 04/02/2016 1419   ALT 12 01/12/2014 1317   ALKPHOS 100 04/02/2016 1419   ALKPHOS 113 01/12/2014 1317   BILITOT 0.5 04/02/2016 1419   BILITOT 0.4 01/12/2014 1317   GFRNONAA 51 (L) 04/02/2016 1419   GFRNONAA 38 (L) 01/12/2014 1317   GFRAA 59  (L) 04/02/2016 1419   GFRAA 44 (L) 01/12/2014 1317    No results found for: SPEP, UPEP  Lab Results  Component Value Date   WBC 6.3 04/02/2016   NEUTROABS 3.8 04/02/2016   HGB 14.2 04/02/2016   HCT 41.7 04/02/2016   MCV 89.9 04/02/2016   PLT 310 04/02/2016      Chemistry      Component Value Date/Time   NA 139 04/02/2016 1419   NA 138 01/12/2014 1317   K 3.4 (L) 04/02/2016 1419  K 3.1 (L) 01/12/2014 1317   CL 102 04/02/2016 1419   CL 102 01/12/2014 1317   CO2 29 04/02/2016 1419   CO2 31 01/12/2014 1317   BUN 9 04/02/2016 1419   BUN 14 01/12/2014 1317   CREATININE 0.96 04/02/2016 1419   CREATININE 1.27 01/12/2014 1317      Component Value Date/Time   CALCIUM 8.8 (L) 04/02/2016 1419   CALCIUM 9.0 01/12/2014 1317   ALKPHOS 100 04/02/2016 1419   ALKPHOS 113 01/12/2014 1317   AST 18 04/02/2016 1419   AST 22 01/12/2014 1317   ALT 11 (L) 04/02/2016 1419   ALT 12 01/12/2014 1317   BILITOT 0.5 04/02/2016 1419   BILITOT 0.4 01/12/2014 1317       RADIOGRAPHIC STUDIES: I have personally reviewed the radiological images as listed and agreed with the findings in the report. No results found.   ASSESSMENT & PLAN:   Polycythemia # POLYCTHEMIA VERA\ THROMBOCYTOSIS- on Hydrea 500 three times/week. Patient's hematocrit is 42; no phlebotomy.  # unfortunately patient continues to smoke- patient not interested in quitting smoking  # patient will follow-up with me in 6 months CBC CMP at that time.   # will need port flushes every 8 weeks.        Cammie Sickle, MD 04/02/2016 6:06 PM

## 2016-04-02 NOTE — Assessment & Plan Note (Signed)
#   POLYCTHEMIA VERA\ THROMBOCYTOSIS- on Hydrea 500 three times/week. Patient's hematocrit is 42; no phlebotomy.  # unfortunately patient continues to smoke- patient not interested in quitting smoking  # patient will follow-up with me in 6 months CBC CMP at that time.   # will need port flushes every 8 weeks.

## 2016-04-08 ENCOUNTER — Ambulatory Visit: Payer: Medicare Other | Admitting: General Surgery

## 2016-05-21 ENCOUNTER — Encounter: Payer: Self-pay | Admitting: *Deleted

## 2016-06-03 ENCOUNTER — Inpatient Hospital Stay: Payer: Medicare Other

## 2016-06-04 ENCOUNTER — Inpatient Hospital Stay: Payer: Medicare Other | Attending: Internal Medicine

## 2016-06-04 DIAGNOSIS — D45 Polycythemia vera: Secondary | ICD-10-CM | POA: Diagnosis present

## 2016-06-04 DIAGNOSIS — Z452 Encounter for adjustment and management of vascular access device: Secondary | ICD-10-CM | POA: Insufficient documentation

## 2016-06-04 MED ORDER — HEPARIN SOD (PORK) LOCK FLUSH 100 UNIT/ML IV SOLN
500.0000 [IU] | Freq: Once | INTRAVENOUS | Status: AC
  Administered 2016-06-04: 500 [IU] via INTRAVENOUS
  Filled 2016-06-04: qty 5

## 2016-06-04 MED ORDER — SODIUM CHLORIDE 0.9% FLUSH
10.0000 mL | INTRAVENOUS | Status: DC | PRN
  Administered 2016-06-04: 10 mL via INTRAVENOUS
  Filled 2016-06-04: qty 10

## 2016-08-03 ENCOUNTER — Inpatient Hospital Stay: Payer: Medicare Other | Attending: Internal Medicine

## 2016-08-03 DIAGNOSIS — D751 Secondary polycythemia: Secondary | ICD-10-CM | POA: Insufficient documentation

## 2016-08-03 DIAGNOSIS — Z452 Encounter for adjustment and management of vascular access device: Secondary | ICD-10-CM | POA: Insufficient documentation

## 2016-08-03 DIAGNOSIS — C801 Malignant (primary) neoplasm, unspecified: Secondary | ICD-10-CM

## 2016-08-03 MED ORDER — HEPARIN SOD (PORK) LOCK FLUSH 100 UNIT/ML IV SOLN
500.0000 [IU] | Freq: Once | INTRAVENOUS | Status: AC
  Administered 2016-08-03: 500 [IU] via INTRAVENOUS
  Filled 2016-08-03: qty 5

## 2016-08-03 MED ORDER — SODIUM CHLORIDE 0.9% FLUSH
10.0000 mL | Freq: Once | INTRAVENOUS | Status: AC
  Administered 2016-08-03: 10 mL via INTRAVENOUS
  Filled 2016-08-03: qty 10

## 2016-10-05 ENCOUNTER — Inpatient Hospital Stay: Payer: Medicare Other

## 2016-10-05 ENCOUNTER — Inpatient Hospital Stay: Payer: Medicare Other | Admitting: Internal Medicine

## 2016-10-15 ENCOUNTER — Other Ambulatory Visit: Payer: Medicare Other

## 2016-10-15 ENCOUNTER — Ambulatory Visit: Payer: Medicare Other | Admitting: Oncology

## 2016-10-22 ENCOUNTER — Inpatient Hospital Stay: Payer: Medicare Other

## 2016-10-22 ENCOUNTER — Inpatient Hospital Stay: Payer: Medicare Other | Admitting: Oncology

## 2016-11-03 ENCOUNTER — Inpatient Hospital Stay (HOSPITAL_BASED_OUTPATIENT_CLINIC_OR_DEPARTMENT_OTHER): Payer: Medicare Other | Admitting: Oncology

## 2016-11-03 ENCOUNTER — Encounter: Payer: Self-pay | Admitting: Oncology

## 2016-11-03 ENCOUNTER — Inpatient Hospital Stay: Payer: Medicare Other

## 2016-11-03 ENCOUNTER — Inpatient Hospital Stay: Payer: Medicare Other | Attending: Internal Medicine | Admitting: *Deleted

## 2016-11-03 VITALS — BP 154/85 | HR 78 | Temp 97.5°F | Resp 21 | Wt 158.8 lb

## 2016-11-03 DIAGNOSIS — E785 Hyperlipidemia, unspecified: Secondary | ICD-10-CM

## 2016-11-03 DIAGNOSIS — G8929 Other chronic pain: Secondary | ICD-10-CM | POA: Insufficient documentation

## 2016-11-03 DIAGNOSIS — Z880 Allergy status to penicillin: Secondary | ICD-10-CM

## 2016-11-03 DIAGNOSIS — F17210 Nicotine dependence, cigarettes, uncomplicated: Secondary | ICD-10-CM | POA: Insufficient documentation

## 2016-11-03 DIAGNOSIS — J449 Chronic obstructive pulmonary disease, unspecified: Secondary | ICD-10-CM | POA: Insufficient documentation

## 2016-11-03 DIAGNOSIS — D751 Secondary polycythemia: Secondary | ICD-10-CM

## 2016-11-03 DIAGNOSIS — R7989 Other specified abnormal findings of blood chemistry: Secondary | ICD-10-CM | POA: Insufficient documentation

## 2016-11-03 DIAGNOSIS — I739 Peripheral vascular disease, unspecified: Secondary | ICD-10-CM

## 2016-11-03 DIAGNOSIS — M549 Dorsalgia, unspecified: Secondary | ICD-10-CM | POA: Diagnosis not present

## 2016-11-03 DIAGNOSIS — Z8673 Personal history of transient ischemic attack (TIA), and cerebral infarction without residual deficits: Secondary | ICD-10-CM | POA: Diagnosis not present

## 2016-11-03 DIAGNOSIS — Z95828 Presence of other vascular implants and grafts: Secondary | ICD-10-CM

## 2016-11-03 DIAGNOSIS — Z79899 Other long term (current) drug therapy: Secondary | ICD-10-CM | POA: Insufficient documentation

## 2016-11-03 LAB — COMPREHENSIVE METABOLIC PANEL
ALBUMIN: 3.8 g/dL (ref 3.5–5.0)
ALT: 15 U/L (ref 14–54)
ANION GAP: 8 (ref 5–15)
AST: 20 U/L (ref 15–41)
Alkaline Phosphatase: 79 U/L (ref 38–126)
BUN: 13 mg/dL (ref 6–20)
CHLORIDE: 102 mmol/L (ref 101–111)
CO2: 30 mmol/L (ref 22–32)
CREATININE: 0.93 mg/dL (ref 0.44–1.00)
Calcium: 8.8 mg/dL — ABNORMAL LOW (ref 8.9–10.3)
GFR calc Af Amer: >60 mL/min (ref >60)
GFR calc non Af Amer: 52 mL/min — ABNORMAL LOW (ref >60)
GLUCOSE: 104 mg/dL — AB (ref 65–99)
Potassium: 3.4 mmol/L — ABNORMAL LOW (ref 3.5–5.1)
SODIUM: 140 mmol/L (ref 135–145)
Total Bilirubin: 0.6 mg/dL (ref 0.3–1.2)
Total Protein: 6.7 g/dL (ref 6.5–8.1)

## 2016-11-03 LAB — CBC WITH DIFFERENTIAL/PLATELET
BASOS PCT: 1 %
Basophils Absolute: 0 10*3/uL (ref 0–0.1)
EOS ABS: 0.1 10*3/uL (ref 0–0.7)
EOS PCT: 2 %
HCT: 41.9 % (ref 35.0–47.0)
HEMOGLOBIN: 14.7 g/dL (ref 12.0–16.0)
Lymphocytes Relative: 30 %
Lymphs Abs: 1.2 10*3/uL (ref 1.0–3.6)
MCH: 34.3 pg — ABNORMAL HIGH (ref 26.0–34.0)
MCHC: 35.1 g/dL (ref 32.0–36.0)
MCV: 97.7 fL (ref 80.0–100.0)
Monocytes Absolute: 0.4 10*3/uL (ref 0.2–0.9)
Monocytes Relative: 10 %
NEUTROS PCT: 57 %
Neutro Abs: 2.3 10*3/uL (ref 1.4–6.5)
PLATELETS: 343 10*3/uL (ref 150–440)
RBC: 4.28 MIL/uL (ref 3.80–5.20)
RDW: 14.5 % (ref 11.5–14.5)
WBC: 4 10*3/uL (ref 3.6–11.0)

## 2016-11-03 MED ORDER — SODIUM CHLORIDE 0.9% FLUSH
10.0000 mL | INTRAVENOUS | Status: DC | PRN
Start: 2016-11-03 — End: 2016-11-03
  Filled 2016-11-03: qty 10

## 2016-11-03 MED ORDER — HEPARIN SOD (PORK) LOCK FLUSH 100 UNIT/ML IV SOLN
500.0000 [IU] | Freq: Once | INTRAVENOUS | Status: AC
  Administered 2016-11-03: 500 [IU] via INTRAVENOUS
  Filled 2016-11-03: qty 5

## 2016-11-03 NOTE — Progress Notes (Signed)
No complaints at this time.

## 2016-11-03 NOTE — Progress Notes (Signed)
Hematology/Oncology Consult note [facility]  Telephone:([phone] [phone]  Patient Care Team: Hortencia Pilar, MD as PCP - General (Family Medicine)   Name of the patient: Krista Ortiz  [id number]  06-Apr-1924   Date of visit: 11/03/16  Diagnosis- polycythemia/ thrombocytosis  Chief complaint/ Reason for visit- routine f/u  Heme/Onc history: Patient is a 83 year old female with a long-standing history of polycythemia/thrombocytosis. It is unclear if this is primary or secondary. However patient has been on Hydrea 500 mg 3 times a week. She is also required phlebotomy in the past but none recently.  Interval history- currently she is doing well and reports some chronic back pain. Denies other complaints.  ECOG PS- 2   Review of systems- Review of Systems  Constitutional: Negative for chills, fever, malaise/fatigue and weight loss.  HENT: Negative for congestion, ear discharge and nosebleeds.   Eyes: Negative for blurred vision.  Respiratory: Negative for cough, hemoptysis, sputum production, shortness of breath and wheezing.   Cardiovascular: Negative for chest pain, palpitations, orthopnea and claudication.  Gastrointestinal: Negative for abdominal pain, blood in stool, constipation, diarrhea, heartburn, melena, nausea and vomiting.  Genitourinary: Negative for dysuria, flank pain, frequency, hematuria and urgency.  Musculoskeletal: Positive for back pain. Negative for joint pain and myalgias.  Skin: Negative for rash.  Neurological: Negative for dizziness, tingling, focal weakness, seizures, weakness and headaches.  Endo/Heme/Allergies: Does not bruise/bleed easily.  Psychiatric/Behavioral: Negative for depression and suicidal ideas. The patient does not have insomnia.      Current treatment- hydrea 3 times a week  Allergies  Allergen Reactions  . Citalopram Other (See Comments)    Reaction:  Fatigue   . Pneumococcal Vaccine Other  (See Comments)    Doesn't remember what occurred.  . Pneumovax [Pneumococcal Polysaccharide Vaccine] Other (See Comments)    Reaction:  Unknown   . Requip [Ropinirole Hcl] Other (See Comments)    Reaction:  Dry mouth   . Penicillins Rash and Other (See Comments)         Past Medical History:  Diagnosis Date  . Acoustic neuroma [facility])    md notes says history of acoustic neuroma or meningioma followed up by yearly MRI  . Chronic back pain   . COPD (chronic obstructive pulmonary disease) ([place])   . Hyperlipidemia   . Polycythemia   . Polycythemia, secondary 04/26/2015  . Port-a-cath in place   . PVD (peripheral vascular disease) ([place])   . TIA (transient ischemic attack)      Past Surgical History:  Procedure Laterality Date  . ABDOMINAL HYSTERECTOMY    . APPENDECTOMY    . BACK SURGERY    . cataracts    . CHOLECYSTECTOMY    . PACEMAKER INSERTION Left 02/18/2015   Procedure: INSERTION PACEMAKER;  Surgeon: Isaias Cowman, MD;  Location: [facility] ORS;  Service: Cardiovascular;  Laterality: Left;  . REPLACEMENT TOTAL KNEE    . TONSILLECTOMY      Social History   Social History  . Marital status: Widowed    Spouse name: N/A  . Number of children: N/A  . Years of education: N/A   Occupational History  . Not on file.   Social History Main Topics  . Smoking status: Current Every Day Smoker    Packs/day: 0.50    Types: Cigarettes  . Smokeless tobacco: Never Used  . Alcohol use No  . Drug use: No  . Sexual activity: Not Currently   Other Topics Concern  . Not  on file   Social History Narrative  . No narrative on file    Family History  Problem Relation Age of Onset  . CVA Sister   . Heart attack Brother   . Other Mother     Old age  . Other Father     Old age     Current Outpatient Prescriptions:  .  acetaminophen (TYLENOL) 325 MG tablet, Take 650 mg by mouth every 6 (six) hours as needed., Disp: , Rfl:  .  albuterol (PROVENTIL) (2.5 MG/3ML) 0.083%  nebulizer solution, Inhale 3 mLs into the lungs every 6 (six) hours as needed., Disp: , Rfl: 0 .  atorvastatin (LIPITOR) 10 MG tablet, Take 10 mg by mouth daily., Disp: , Rfl:  .  clopidogrel (PLAVIX) 75 MG tablet, Take 75 mg by mouth daily., Disp: , Rfl:  .  furosemide (LASIX) 20 MG tablet, Take 20 mg by mouth daily., Disp: , Rfl:  .  hydroxyurea (HYDREA) 500 MG capsule, Take 500 mg by mouth 3 (three) times a week. Pt takes on Monday, Wednesday, and Friday., Disp: , Rfl:  .  potassium chloride (K-DUR) 10 MEQ tablet, Take 10 mEq by mouth daily., Disp: , Rfl:  .  SPIRIVA HANDIHALER 18 MCG inhalation capsule, Place 1 capsule into inhaler and inhale daily., Disp: , Rfl: 2 No current facility-administered medications for this visit.   Facility-Administered Medications Ordered in Other Visits:  .  heparin lock flush 100 unit/mL, 500 Units, Intravenous, Once, Cammie Sickle, MD .  sodium chloride flush (NS) 0.9 % injection 10 mL, 10 mL, Intravenous, PRN, Evlyn Kanner, NP, 10 mL at 10/03/15 0958 .  sodium chloride flush (NS) 0.9 % injection 10 mL, 10 mL, Intravenous, PRN, Cammie Sickle, MD  Physical exam:  Vitals:   11/03/16 1416  BP: (!) 154/85  Pulse: 78  Resp: (!) 21  Temp: 97.5 F (36.4 C)  TempSrc: Tympanic  Weight: 158 lb 13.5 oz (72 kg)   Physical Exam  Constitutional: She is oriented to person, place, and time.  Elderly frail woman who appears in no acute distress  HENT:  Head: Normocephalic and atraumatic.  Eyes: EOM are normal. Pupils are equal, round, and reactive to light.  Neck: Normal range of motion.  Cardiovascular: Normal rate, regular rhythm and normal heart sounds.   Pulmonary/Chest: Effort normal and breath sounds normal.  Abdominal: Soft. Bowel sounds are normal.  Neurological: She is alert and oriented to person, place, and time.  Skin: Skin is warm and dry.     CMP Latest Ref Rng & Units 11/03/2016  Glucose 65 - 99 mg/dL 104(H)  BUN 6 - 20  mg/dL 13  Creatinine 0.44 - 1.00 mg/dL 0.93  Sodium 135 - 145 mmol/L 140  Potassium 3.5 - 5.1 mmol/L 3.4(L)  Chloride 101 - 111 mmol/L 102  CO2 22 - 32 mmol/L 30  Calcium 8.9 - 10.3 mg/dL 8.8(L)  Total Protein 6.5 - 8.1 g/dL 6.7  Total Bilirubin 0.3 - 1.2 mg/dL 0.6  Alkaline Phos 38 - 126 U/L 79  AST 15 - 41 U/L 20  ALT 14 - 54 U/L 15   CBC Latest Ref Rng & Units 11/03/2016  WBC 3.6 - 11.0 K/uL 4.0  Hemoglobin 12.0 - 16.0 g/dL 14.7  Hematocrit 35.0 - 47.0 % 41.9  Platelets 150 - 440 K/uL 343      Assessment and plan- Patient is a 83 y.o. female with a history of polycythemia/thrombocytosis (unclear if primary or  secondary). She is currently on hydroxyurea 500 mg 3 times a day  1. I will try to get her prior records to see if she was jak 2 positive or negative. At this time she will continue hydroxyurea 500 mg 3 times a day. Her hematocrit is less than 45 and there is no indication for phlebotomy at this time. We will see her back in 3 months time with repeat CBC and CMP. If I am unable to retrieve her records for jak 2 mutation testing I am inclined to reorder it in 3 months instead of continuing the Hydrea   Visit Diagnosis 1. Polycythemia      Dr. Randa Evens, MD, MPH [place] at [facility] Pager- [phone] 11/03/2016 2:35 PM

## 2016-11-04 ENCOUNTER — Other Ambulatory Visit: Payer: Self-pay | Admitting: *Deleted

## 2016-11-04 DIAGNOSIS — D751 Secondary polycythemia: Secondary | ICD-10-CM

## 2016-11-09 ENCOUNTER — Other Ambulatory Visit: Payer: Self-pay | Admitting: *Deleted

## 2016-11-09 DIAGNOSIS — D751 Secondary polycythemia: Secondary | ICD-10-CM

## 2016-11-20 ENCOUNTER — Other Ambulatory Visit: Payer: Self-pay | Admitting: Orthopedic Surgery

## 2016-11-20 DIAGNOSIS — S22000A Wedge compression fracture of unspecified thoracic vertebra, initial encounter for closed fracture: Secondary | ICD-10-CM

## 2016-11-25 ENCOUNTER — Encounter
Admission: RE | Admit: 2016-11-25 | Discharge: 2016-11-25 | Disposition: A | Discharge status: Home / Self Care | Payer: Medicare Other | Source: Ambulatory Visit | Attending: Orthopedic Surgery | Admitting: Orthopedic Surgery

## 2016-11-25 DIAGNOSIS — I700 Atherosclerosis of aorta: Secondary | ICD-10-CM | POA: Insufficient documentation

## 2016-11-25 DIAGNOSIS — R911 Solitary pulmonary nodule: Secondary | ICD-10-CM | POA: Diagnosis not present

## 2016-11-25 DIAGNOSIS — M5136 Other intervertebral disc degeneration, lumbar region: Secondary | ICD-10-CM | POA: Diagnosis not present

## 2016-11-25 DIAGNOSIS — Z96651 Presence of right artificial knee joint: Secondary | ICD-10-CM

## 2016-11-25 DIAGNOSIS — S22000A Wedge compression fracture of unspecified thoracic vertebra, initial encounter for closed fracture: Secondary | ICD-10-CM | POA: Insufficient documentation

## 2016-11-25 DIAGNOSIS — J479 Bronchiectasis, uncomplicated: Secondary | ICD-10-CM | POA: Diagnosis not present

## 2016-11-25 DIAGNOSIS — M5146 Schmorl's nodes, lumbar region: Secondary | ICD-10-CM | POA: Diagnosis not present

## 2016-11-25 DIAGNOSIS — X58XXXA Exposure to other specified factors, initial encounter: Secondary | ICD-10-CM | POA: Insufficient documentation

## 2016-11-25 DIAGNOSIS — I2510 Atherosclerotic heart disease of native coronary artery without angina pectoris: Secondary | ICD-10-CM | POA: Insufficient documentation

## 2016-11-25 DIAGNOSIS — S62101A Fracture of unspecified carpal bone, right wrist, initial encounter for closed fracture: Secondary | ICD-10-CM

## 2016-11-25 HISTORY — DX: Presence of right artificial knee joint: Z96.651

## 2016-11-25 HISTORY — DX: Fracture of unspecified carpal bone, right wrist, initial encounter for closed fracture: S62.101A

## 2016-11-25 HISTORY — DX: Wedge compression fracture of unspecified thoracic vertebra, initial encounter for closed fracture: S22.000A

## 2016-11-25 MED ORDER — TECHNETIUM TC 99M MEDRONATE IV KIT
25.0000 | PACK | Freq: Once | INTRAVENOUS | Status: AC | PRN
Start: 2016-11-25 — End: 2016-11-25
  Administered 2016-11-25: 23.34 via INTRAVENOUS

## 2016-12-15 ENCOUNTER — Inpatient Hospital Stay: Payer: Medicare Other | Attending: Oncology

## 2016-12-15 DIAGNOSIS — Z452 Encounter for adjustment and management of vascular access device: Secondary | ICD-10-CM | POA: Insufficient documentation

## 2016-12-15 DIAGNOSIS — D751 Secondary polycythemia: Secondary | ICD-10-CM | POA: Diagnosis present

## 2016-12-15 DIAGNOSIS — F17210 Nicotine dependence, cigarettes, uncomplicated: Secondary | ICD-10-CM | POA: Diagnosis not present

## 2016-12-15 DIAGNOSIS — Z95828 Presence of other vascular implants and grafts: Secondary | ICD-10-CM

## 2016-12-15 MED ORDER — SODIUM CHLORIDE 0.9% FLUSH
10.0000 mL | INTRAVENOUS | Status: DC | PRN
  Administered 2016-12-15: 10 mL via INTRAVENOUS
  Filled 2016-12-15: qty 10

## 2016-12-15 MED ORDER — HEPARIN SOD (PORK) LOCK FLUSH 100 UNIT/ML IV SOLN
500.0000 [IU] | Freq: Once | INTRAVENOUS | Status: AC
  Administered 2016-12-15: 500 [IU] via INTRAVENOUS

## 2017-01-26 ENCOUNTER — Inpatient Hospital Stay: Payer: Medicare Other

## 2017-01-26 ENCOUNTER — Encounter: Payer: Self-pay | Admitting: Oncology

## 2017-01-26 ENCOUNTER — Inpatient Hospital Stay: Payer: Medicare Other | Attending: Oncology | Admitting: Oncology

## 2017-01-26 VITALS — BP 124/66 | HR 80 | Temp 97.4°F | Resp 16 | Wt 151.8 lb

## 2017-01-26 DIAGNOSIS — D751 Secondary polycythemia: Secondary | ICD-10-CM | POA: Diagnosis present

## 2017-01-26 DIAGNOSIS — I739 Peripheral vascular disease, unspecified: Secondary | ICD-10-CM

## 2017-01-26 DIAGNOSIS — R600 Localized edema: Secondary | ICD-10-CM | POA: Insufficient documentation

## 2017-01-26 DIAGNOSIS — R7989 Other specified abnormal findings of blood chemistry: Secondary | ICD-10-CM | POA: Insufficient documentation

## 2017-01-26 DIAGNOSIS — D75839 Thrombocytosis, unspecified: Secondary | ICD-10-CM

## 2017-01-26 DIAGNOSIS — D473 Essential (hemorrhagic) thrombocythemia: Secondary | ICD-10-CM

## 2017-01-26 DIAGNOSIS — R5383 Other fatigue: Secondary | ICD-10-CM

## 2017-01-26 DIAGNOSIS — Z8673 Personal history of transient ischemic attack (TIA), and cerebral infarction without residual deficits: Secondary | ICD-10-CM | POA: Diagnosis not present

## 2017-01-26 DIAGNOSIS — G8929 Other chronic pain: Secondary | ICD-10-CM | POA: Diagnosis not present

## 2017-01-26 DIAGNOSIS — Z880 Allergy status to penicillin: Secondary | ICD-10-CM | POA: Diagnosis not present

## 2017-01-26 DIAGNOSIS — Z950 Presence of cardiac pacemaker: Secondary | ICD-10-CM | POA: Diagnosis not present

## 2017-01-26 DIAGNOSIS — R5381 Other malaise: Secondary | ICD-10-CM | POA: Diagnosis not present

## 2017-01-26 DIAGNOSIS — Z79899 Other long term (current) drug therapy: Secondary | ICD-10-CM | POA: Diagnosis not present

## 2017-01-26 DIAGNOSIS — E785 Hyperlipidemia, unspecified: Secondary | ICD-10-CM | POA: Insufficient documentation

## 2017-01-26 DIAGNOSIS — F17210 Nicotine dependence, cigarettes, uncomplicated: Secondary | ICD-10-CM | POA: Insufficient documentation

## 2017-01-26 DIAGNOSIS — M549 Dorsalgia, unspecified: Secondary | ICD-10-CM | POA: Diagnosis not present

## 2017-01-26 DIAGNOSIS — J449 Chronic obstructive pulmonary disease, unspecified: Secondary | ICD-10-CM | POA: Diagnosis not present

## 2017-01-26 DIAGNOSIS — Z95828 Presence of other vascular implants and grafts: Secondary | ICD-10-CM

## 2017-01-26 DIAGNOSIS — Z7902 Long term (current) use of antithrombotics/antiplatelets: Secondary | ICD-10-CM | POA: Diagnosis not present

## 2017-01-26 LAB — CBC
HEMATOCRIT: 43.9 % (ref 35.0–47.0)
HEMOGLOBIN: 15.3 g/dL (ref 12.0–16.0)
MCH: 33.4 pg (ref 26.0–34.0)
MCHC: 34.8 g/dL (ref 32.0–36.0)
MCV: 96.1 fL (ref 80.0–100.0)
Platelets: 357 10*3/uL (ref 150–440)
RBC: 4.56 MIL/uL (ref 3.80–5.20)
RDW: 14.4 % (ref 11.5–14.5)
WBC: 4.4 10*3/uL (ref 3.6–11.0)

## 2017-01-26 LAB — COMPREHENSIVE METABOLIC PANEL
ALBUMIN: 3.7 g/dL (ref 3.5–5.0)
ALK PHOS: 83 U/L (ref 38–126)
ALT: 15 U/L (ref 14–54)
AST: 21 U/L (ref 15–41)
Anion gap: 7 (ref 5–15)
BUN: 9 mg/dL (ref 6–20)
CALCIUM: 8.9 mg/dL (ref 8.9–10.3)
CO2: 30 mmol/L (ref 22–32)
CREATININE: 0.96 mg/dL (ref 0.44–1.00)
Chloride: 102 mmol/L (ref 101–111)
GFR calc Af Amer: 58 mL/min — ABNORMAL LOW (ref >60)
GFR, EST NON AFRICAN AMERICAN: 50 mL/min — AB (ref >60)
GLUCOSE: 73 mg/dL (ref 65–99)
Potassium: 3.3 mmol/L — ABNORMAL LOW (ref 3.5–5.1)
Sodium: 139 mmol/L (ref 135–145)
Total Bilirubin: 0.5 mg/dL (ref 0.3–1.2)
Total Protein: 6.3 g/dL — ABNORMAL LOW (ref 6.5–8.1)

## 2017-01-26 MED ORDER — HEPARIN SOD (PORK) LOCK FLUSH 100 UNIT/ML IV SOLN
500.0000 [IU] | Freq: Once | INTRAVENOUS | Status: AC
  Administered 2017-01-26: 500 [IU] via INTRAVENOUS

## 2017-01-26 MED ORDER — SODIUM CHLORIDE 0.9% FLUSH
10.0000 mL | INTRAVENOUS | Status: DC | PRN
  Administered 2017-01-26: 10 mL via INTRAVENOUS
  Filled 2017-01-26: qty 10

## 2017-01-26 NOTE — Progress Notes (Signed)
Hematology/Oncology Consult note [facility]  [phone] [phone]  Patient Care Team: Hortencia Pilar, MD as PCP - General (Family Medicine)   Name of the patient: Krista Ortiz  [id number]  1923/10/19   Date of visit: 01/26/17  Diagnosis- polycythemia/ thrombocytosis  Chief complaint/ Reason for visit- routine f/u  Heme/Onc history: Patient is a 83 year old female with a long-standing history of polycythemia/thrombocytosis. It is unclear if this is primary or secondary. However patient has been on Hydrea 500 mg 3 times a week. She is also required phlebotomy in the past but none recently.  Interval history- doing well for her age. Lives independently and is able to drive. Tolerating hydrea well without side effects    Review of systems- Review of Systems  Constitutional: Positive for malaise/fatigue. Negative for chills, fever and weight loss.  HENT: Negative for congestion, ear discharge and nosebleeds.   Eyes: Negative for blurred vision.  Respiratory: Negative for cough, hemoptysis, sputum production, shortness of breath and wheezing.   Cardiovascular: Negative for chest pain, palpitations, orthopnea and claudication.  Gastrointestinal: Negative for abdominal pain, blood in stool, constipation, diarrhea, heartburn, melena, nausea and vomiting.  Genitourinary: Negative for dysuria, flank pain, frequency, hematuria and urgency.  Musculoskeletal: Positive for back pain. Negative for joint pain and myalgias.  Skin: Negative for rash.  Neurological: Negative for dizziness, tingling, focal weakness, seizures, weakness and headaches.  Endo/Heme/Allergies: Does not bruise/bleed easily.  Psychiatric/Behavioral: Negative for depression and suicidal ideas. The patient does not have insomnia.       Allergies  Allergen Reactions  . Citalopram Other (See Comments)    Reaction:  Fatigue   . Pneumococcal Vaccine Other (See Comments)      Doesn't remember what occurred.  . Pneumovax [Pneumococcal Polysaccharide Vaccine] Other (See Comments)    Reaction:  Unknown   . Requip [Ropinirole Hcl] Other (See Comments)    Reaction:  Dry mouth   . Penicillins Rash and Other (See Comments)         Past Medical History:  Diagnosis Date  . Acoustic neuroma [facility])    md notes says history of acoustic neuroma or meningioma followed up by yearly MRI  . Chronic back pain   . COPD (chronic obstructive pulmonary disease) ([place])   . Hyperlipidemia   . Knee joint replacement status, right 11/25/2016  . Polycythemia   . Polycythemia, secondary 04/26/2015  . Port-a-cath in place   . PVD (peripheral vascular disease) ([place])   . Right wrist fracture 11/25/2016  . Thoracic compression fracture, closed, initial encounter ([place]) 11/25/2016  . TIA (transient ischemic attack)      Past Surgical History:  Procedure Laterality Date  . ABDOMINAL HYSTERECTOMY    . APPENDECTOMY    . BACK SURGERY    . cataracts    . CHOLECYSTECTOMY    . PACEMAKER INSERTION Left 02/18/2015   Procedure: INSERTION PACEMAKER;  Surgeon: Isaias Cowman, MD;  Location: [facility] ORS;  Service: Cardiovascular;  Laterality: Left;  . REPLACEMENT TOTAL KNEE    . TONSILLECTOMY      Social History   Social History  . Marital status: Widowed    Spouse name: N/A  . Number of children: N/A  . Years of education: N/A   Occupational History  . Not on file.   Social History Main Topics  . Smoking status: Current Every Day Smoker    Packs/day: 0.50    Types: Cigarettes  . Smokeless tobacco: Never Used  . Alcohol  use No  . Drug use: No  . Sexual activity: Not Currently   Other Topics Concern  . Not on file   Social History Narrative  . No narrative on file    Family History  Problem Relation Age of Onset  . CVA Sister   . Heart attack Brother   . Other Mother        Old age  . Other Father        Old age     Current Outpatient Prescriptions:  .   acetaminophen (TYLENOL) 325 MG tablet, Take 650 mg by mouth every 6 (six) hours as needed., Disp: , Rfl:  .  albuterol (PROVENTIL) (2.5 MG/3ML) 0.083% nebulizer solution, Inhale 3 mLs into the lungs every 6 (six) hours as needed., Disp: , Rfl: 0 .  atorvastatin (LIPITOR) 10 MG tablet, Take 10 mg by mouth daily., Disp: , Rfl:  .  clopidogrel (PLAVIX) 75 MG tablet, Take 75 mg by mouth daily., Disp: , Rfl:  .  furosemide (LASIX) 20 MG tablet, Take 20 mg by mouth daily., Disp: , Rfl:  .  hydroxyurea (HYDREA) 500 MG capsule, Take 500 mg by mouth 3 (three) times a week. Pt takes on Monday, Wednesday, and Friday., Disp: , Rfl:  .  potassium chloride (K-DUR) 10 MEQ tablet, Take 10 mEq by mouth daily., Disp: , Rfl:  .  SPIRIVA HANDIHALER 18 MCG inhalation capsule, Place 1 capsule into inhaler and inhale daily., Disp: , Rfl: 2 No current facility-administered medications for this visit.   Facility-Administered Medications Ordered in Other Visits:  .  heparin lock flush 100 unit/mL, 500 Units, Intravenous, Once, [place], Melissa C, MD .  sodium chloride flush (NS) 0.9 % injection 10 mL, 10 mL, Intravenous, PRN, Evlyn Kanner, NP, 10 mL at 10/03/15 0958 .  sodium chloride flush (NS) 0.9 % injection 10 mL, 10 mL, Intravenous, PRN, Lequita Asal, MD  Physical exam:  Vitals:   01/26/17 1336  BP: 124/66  Pulse: 80  Resp: 16  Temp: 97.4 F (36.3 C)  TempSrc: Tympanic  SpO2: 97%  Weight: 151 lb 12.8 oz (68.9 kg)   Physical Exam  Constitutional: She is oriented to person, place, and time.  Elderly frail woman in no acute distress  HENT:  Head: Normocephalic and atraumatic.  Eyes: EOM are normal. Pupils are equal, round, and reactive to light.  Neck: Normal range of motion.  Cardiovascular: Normal rate, regular rhythm and normal heart sounds.   Pulmonary/Chest: Effort normal and breath sounds normal.  Abdominal: Soft. Bowel sounds are normal.  Musculoskeletal: She exhibits edema.    Neurological: She is alert and oriented to person, place, and time.  Skin: Skin is warm and dry.     CMP Latest Ref Rng & Units 01/26/2017  Glucose 65 - 99 mg/dL 73  BUN 6 - 20 mg/dL 9  Creatinine 0.44 - 1.00 mg/dL 0.96  Sodium 135 - 145 mmol/L 139  Potassium 3.5 - 5.1 mmol/L 3.3(L)  Chloride 101 - 111 mmol/L 102  CO2 22 - 32 mmol/L 30  Calcium 8.9 - 10.3 mg/dL 8.9  Total Protein 6.5 - 8.1 g/dL 6.3(L)  Total Bilirubin 0.3 - 1.2 mg/dL 0.5  Alkaline Phos 38 - 126 U/L 83  AST 15 - 41 U/L 21  ALT 14 - 54 U/L 15   CBC Latest Ref Rng & Units 01/26/2017  WBC 3.6 - 11.0 K/uL 4.4  Hemoglobin 12.0 - 16.0 g/dL 15.3  Hematocrit 35.0 - 47.0 %  43.9  Platelets 150 - 440 K/uL 357      Assessment and plan- Patient is a 83 y.o. female with prior h/o polycythemia/ thrombocytosis unclear if primary or secondary but has been on hydrea for a while  Based on todays labs, she does not need any phlebotomy. She is tolerating her hydrea well and will continue. Will wait for JAk2 testing results. Repeat cbc and cmp in 3 months and see me in 6 months with same labs.    Visit Diagnosis 1. Polycythemia   2. Thrombocytosis ([place])      Dr. Randa Evens, MD, MPH [facility] at [facility] Pager- [phone] 01/26/2017 2:02 PM

## 2017-01-27 ENCOUNTER — Emergency Department: Payer: Medicare Other

## 2017-01-27 ENCOUNTER — Emergency Department
Admission: EM | Admit: 2017-01-27 | Discharge: 2017-01-27 | Disposition: A | Discharge status: Home / Self Care | Payer: Medicare Other | Attending: Emergency Medicine | Admitting: Emergency Medicine

## 2017-01-27 ENCOUNTER — Encounter: Payer: Self-pay | Admitting: *Deleted

## 2017-01-27 DIAGNOSIS — Y939 Activity, unspecified: Secondary | ICD-10-CM | POA: Insufficient documentation

## 2017-01-27 DIAGNOSIS — J45909 Unspecified asthma, uncomplicated: Secondary | ICD-10-CM | POA: Insufficient documentation

## 2017-01-27 DIAGNOSIS — F17210 Nicotine dependence, cigarettes, uncomplicated: Secondary | ICD-10-CM | POA: Diagnosis not present

## 2017-01-27 DIAGNOSIS — S0003XA Contusion of scalp, initial encounter: Secondary | ICD-10-CM | POA: Diagnosis not present

## 2017-01-27 DIAGNOSIS — Z7902 Long term (current) use of antithrombotics/antiplatelets: Secondary | ICD-10-CM | POA: Insufficient documentation

## 2017-01-27 DIAGNOSIS — Y92009 Unspecified place in unspecified non-institutional (private) residence as the place of occurrence of the external cause: Secondary | ICD-10-CM | POA: Diagnosis not present

## 2017-01-27 DIAGNOSIS — S0990XA Unspecified injury of head, initial encounter: Secondary | ICD-10-CM | POA: Diagnosis present

## 2017-01-27 DIAGNOSIS — S41111A Laceration without foreign body of right upper arm, initial encounter: Secondary | ICD-10-CM | POA: Diagnosis not present

## 2017-01-27 DIAGNOSIS — W010XXA Fall on same level from slipping, tripping and stumbling without subsequent striking against object, initial encounter: Secondary | ICD-10-CM | POA: Insufficient documentation

## 2017-01-27 DIAGNOSIS — S40011A Contusion of right shoulder, initial encounter: Secondary | ICD-10-CM | POA: Insufficient documentation

## 2017-01-27 DIAGNOSIS — Y999 Unspecified external cause status: Secondary | ICD-10-CM | POA: Diagnosis not present

## 2017-01-27 DIAGNOSIS — Z86011 Personal history of benign neoplasm of the brain: Secondary | ICD-10-CM | POA: Insufficient documentation

## 2017-01-27 DIAGNOSIS — S41119A Laceration without foreign body of unspecified upper arm, initial encounter: Secondary | ICD-10-CM

## 2017-01-27 MED ORDER — ACETAMINOPHEN 500 MG PO TABS: 1000.0000 mg | ORAL_TABLET | Freq: Once | ORAL | Status: DC

## 2017-01-27 MED ORDER — SILVER SULFADIAZINE 1 % EX CREA: TOPICAL_CREAM | CUTANEOUS | 1 refills | Status: DC

## 2017-01-27 NOTE — ED Triage Notes (Addendum)
Pt to ED after falling while at home. Pt reports she bent down to get groceries and "lost balance" and feel forward. Pt denies having been dizzy before fall and denies LOC. Pt has bruising to right side of head, right shoulder and right arm with a skin tear to upper right arm. Pt denies pain at this time. No deformities noted.   Pt currently on Plavix, reports having had blood tested yesterday and stated "my blood is not good from the tests yesterday." Pt could not elaborate further on the testing performed at the clinic yesterday.

## 2017-01-27 NOTE — ED Provider Notes (Addendum)
[facility] Emergency Department Provider Note       Time seen: ----------------------------------------- 2:37 PM on 01/27/2017 -----------------------------------------     I have reviewed the triage vital signs and the nursing notes.   HISTORY   Chief Complaint Fall    HPI Krista Ortiz is a 83 y.o. female who presents to the ED after a fall at home. Patient reports she bent down to get her groceries and she lost her balance and fell forward. Patient denies having been dizzy before the fall and denies any loss consciousness. She reports bruising to the right side of her head and right shoulder pain. Patient presents with a skin tear to the right upper arm, reports she takes Plavix. Patient states she had blood work done yesterday. She denies any other complaints at this time since states she will not stay in the hospital.   Past Medical History:  Diagnosis Date  . Acoustic neuroma [facility])    md notes says history of acoustic neuroma or meningioma followed up by yearly MRI  . Chronic back pain   . COPD (chronic obstructive pulmonary disease) ([place])   . Hyperlipidemia   . Knee joint replacement status, right 11/25/2016  . Polycythemia   . Polycythemia, secondary 04/26/2015  . Port-a-cath in place   . PVD (peripheral vascular disease) ([place])   . Right wrist fracture 11/25/2016  . Thoracic compression fracture, closed, initial encounter ([place]) 11/25/2016  . TIA (transient ischemic attack)     Patient Active Problem List   Diagnosis Date Noted  . Polycythemia 04/02/2016  . Back ache 10/03/2015  . Chronic obstructive pulmonary disease ([place]) 10/03/2015  . Dizziness 10/03/2015  . Decrease in the ability to hear 10/03/2015  . Neoplasm of meninges 10/03/2015  . Combined fat and carbohydrate induced hyperlipemia 10/03/2015  . Neuroma 10/03/2015  . Impaired vision 10/03/2015  . Presence of other vascular implants and grafts 10/03/2015  . Current  smoker 10/03/2015  . Temporary cerebral vascular dysfunction 10/03/2015  . Abnormal CBC 06/12/2015  . Chronic LBP 06/12/2015  . Polycythemia, secondary 04/26/2015  . Hypersensitivity to pneumococcal vaccine 02/21/2015  . Carotid artery narrowing 02/19/2015  . Sick sinus syndrome ([place]) 02/16/2015  . Syncope and collapse 02/15/2015  . Edema leg 10/25/2014  . TI (tricuspid incompetence) 10/25/2014  . Breathlessness on exertion 10/25/2014  . Chest pain at rest 10/03/2014  . Dyspnea, paroxysmal nocturnal 10/03/2014  . Amnesia 04/02/2014  . Abnormal weight loss 04/02/2014  . Osteopenia 03/15/2014  . Chronic pain 07/06/2013  . Peripheral vascular disease ([place]) 06/27/2013  . Ejection murmur 12/09/2012    Past Surgical History:  Procedure Laterality Date  . ABDOMINAL HYSTERECTOMY    . APPENDECTOMY    . BACK SURGERY    . cataracts    . CHOLECYSTECTOMY    . PACEMAKER INSERTION Left 02/18/2015   Procedure: INSERTION PACEMAKER;  Surgeon: Isaias Cowman, MD;  Location: [facility] ORS;  Service: Cardiovascular;  Laterality: Left;  . REPLACEMENT TOTAL KNEE    . TONSILLECTOMY      Allergies Citalopram; Pneumococcal vaccine; Pneumovax [pneumococcal polysaccharide vaccine]; Requip [ropinirole hcl]; and Penicillins  Social History Social History  Substance Use Topics  . Smoking status: Current Every Day Smoker    Packs/day: 0.50    Types: Cigarettes  . Smokeless tobacco: Never Used  . Alcohol use No    Review of Systems Constitutional: Negative for fever. Eyes: Negative for vision changes ENT:  Negative for congestion, sore throat Cardiovascular: Negative for chest  pain. Respiratory: Negative for shortness of breath. Gastrointestinal: Negative for abdominal pain, vomiting and diarrhea. Genitourinary: Negative for dysuria. Musculoskeletal: Negative for back pain, positive for right arm pain Skin: Positive for skin tear, contusions Neurological: Negative for headaches, focal  weakness or numbness.  All systems negative/normal/unremarkable except as stated in the HPI  ____________________________________________   PHYSICAL EXAM:  VITAL SIGNS: ED Triage Vitals [01/27/17 1428]  Enc Vitals Group     BP 137/84     Pulse Rate 84     Resp 16     Temp      Temp Source Oral     SpO2 95 %     Weight 151 lb (68.5 kg)     Height 5\' 5"  (1.651 m)     Head Circumference      Peak Flow      Pain Score      Pain Loc      Pain Edu?      Excl. in [place]?     Constitutional: Alert and oriented. Well appearing and in no distress. Eyes: Conjunctivae are normal. Normal extraocular movements. ENT   Head: Normocephalic, Right frontal scalp contusion   Nose: No congestion/rhinnorhea.   Mouth/Throat: Mucous membranes are moist.   Neck: No stridor. Cardiovascular: Normal rate, regular rhythm. No murmurs, rubs, or gallops. Respiratory: Normal respiratory effort without tachypnea nor retractions. Breath sounds are clear and equal bilaterally. No wheezes/rales/rhonchi. Gastrointestinal: Soft and nontender. Normal bowel sounds Musculoskeletal: Pain with range of motion of the right upper arm and shoulder Neurologic:  Normal speech and language. No gross focal neurologic deficits are appreciated.  Skin: Scattered contusions are noted, particularly on the right arm. Extensive right shoulder hematoma, right forearm contusions and right upper arm skin tear approximately 10 cm. Psychiatric: Mood and affect are normal. Speech and behavior are normal.  ___________________________________________  ED COURSE:  Pertinent labs & imaging results that were available during my care of the patient were reviewed by me and considered in my medical decision making (see chart for details). Patient presents for fall with head injury and right arm injury, we will assess imaging as indicated.   Procedures ____________________________________________   LABS (pertinent  positives/negatives)  Labs Reviewed - No data to display  RADIOLOGY Images were viewed by me  CT head, right humerus x-ray IMPRESSION: No acute intracranial abnormality.  Right forehead scalp hematoma. No underlying fracture.  Stable meningioma at the left cerebellopontine angle.  Mild paranasal sinus disease. IMPRESSION: No acute bony abnormality is noted. Mild soft tissue swelling is noted. ____________________________________________  FINAL ASSESSMENT AND PLAN  Fall, minor head injury, skin tear  Plan: Patient's imaging were dictated above. Patient had presented for a mechanical fall that occurred at home. Currently she denies any dizziness with standing and I checked personally in the room and we have dressed her wounds. She is stable for outpatient follow-up.   Earleen Newport, MD   Note: This note was generated in part or whole with voice recognition software. Voice recognition is usually quite accurate but there are transcription errors that can and very often do occur. I apologize for any typographical errors that were not detected and corrected.     Earleen Newport, MD 01/27/17 [place]    Earleen Newport, MD 01/27/17 1524

## 2017-01-27 NOTE — ED Notes (Signed)
NAD noted at time of D/C. Pt taken to lobby via wheelchair by her son. Pt denies any comments/concerns at this time.

## 2017-01-29 LAB — JAK2 GENOTYPR

## 2017-02-15 ENCOUNTER — Emergency Department: Payer: Medicare Other

## 2017-02-15 ENCOUNTER — Observation Stay
Admission: EM | Admit: 2017-02-15 | Discharge: 2017-02-16 | Disposition: A | Discharge status: Home / Self Care | Payer: Medicare Other | Attending: Internal Medicine | Admitting: Internal Medicine

## 2017-02-15 DIAGNOSIS — E785 Hyperlipidemia, unspecified: Secondary | ICD-10-CM | POA: Insufficient documentation

## 2017-02-15 DIAGNOSIS — D751 Secondary polycythemia: Secondary | ICD-10-CM | POA: Insufficient documentation

## 2017-02-15 DIAGNOSIS — I739 Peripheral vascular disease, unspecified: Secondary | ICD-10-CM | POA: Insufficient documentation

## 2017-02-15 DIAGNOSIS — E876 Hypokalemia: Secondary | ICD-10-CM | POA: Diagnosis not present

## 2017-02-15 DIAGNOSIS — G8929 Other chronic pain: Secondary | ICD-10-CM | POA: Diagnosis not present

## 2017-02-15 DIAGNOSIS — Z7902 Long term (current) use of antithrombotics/antiplatelets: Secondary | ICD-10-CM | POA: Diagnosis not present

## 2017-02-15 DIAGNOSIS — Z950 Presence of cardiac pacemaker: Secondary | ICD-10-CM | POA: Insufficient documentation

## 2017-02-15 DIAGNOSIS — W010XXA Fall on same level from slipping, tripping and stumbling without subsequent striking against object, initial encounter: Secondary | ICD-10-CM | POA: Insufficient documentation

## 2017-02-15 DIAGNOSIS — M549 Dorsalgia, unspecified: Secondary | ICD-10-CM | POA: Insufficient documentation

## 2017-02-15 DIAGNOSIS — Z8673 Personal history of transient ischemic attack (TIA), and cerebral infarction without residual deficits: Secondary | ICD-10-CM | POA: Diagnosis not present

## 2017-02-15 DIAGNOSIS — J441 Chronic obstructive pulmonary disease with (acute) exacerbation: Principal | ICD-10-CM | POA: Insufficient documentation

## 2017-02-15 DIAGNOSIS — T502X5A Adverse effect of carbonic-anhydrase inhibitors, benzothiadiazides and other diuretics, initial encounter: Secondary | ICD-10-CM | POA: Insufficient documentation

## 2017-02-15 DIAGNOSIS — F17210 Nicotine dependence, cigarettes, uncomplicated: Secondary | ICD-10-CM | POA: Insufficient documentation

## 2017-02-15 DIAGNOSIS — J449 Chronic obstructive pulmonary disease, unspecified: Secondary | ICD-10-CM | POA: Diagnosis present

## 2017-02-15 DIAGNOSIS — Z96651 Presence of right artificial knee joint: Secondary | ICD-10-CM | POA: Insufficient documentation

## 2017-02-15 LAB — BRAIN NATRIURETIC PEPTIDE: B Natriuretic Peptide: 168 pg/mL — ABNORMAL HIGH (ref 0.0–100.0)

## 2017-02-15 LAB — BASIC METABOLIC PANEL
Anion gap: 9 (ref 5–15)
BUN: 12 mg/dL (ref 6–20)
CHLORIDE: 97 mmol/L — AB (ref 101–111)
CO2: 32 mmol/L (ref 22–32)
CREATININE: 1.01 mg/dL — AB (ref 0.44–1.00)
Calcium: 8.6 mg/dL — ABNORMAL LOW (ref 8.9–10.3)
GFR calc Af Amer: 55 mL/min — ABNORMAL LOW (ref >60)
GFR calc non Af Amer: 47 mL/min — ABNORMAL LOW (ref >60)
Glucose, Bld: 119 mg/dL — ABNORMAL HIGH (ref 65–99)
POTASSIUM: 2.9 mmol/L — AB (ref 3.5–5.1)
SODIUM: 138 mmol/L (ref 135–145)

## 2017-02-15 LAB — CBC
HEMATOCRIT: 42.3 % (ref 35.0–47.0)
Hemoglobin: 14.6 g/dL (ref 12.0–16.0)
MCH: 33.8 pg (ref 26.0–34.0)
MCHC: 34.4 g/dL (ref 32.0–36.0)
MCV: 98.3 fL (ref 80.0–100.0)
PLATELETS: 297 10*3/uL (ref 150–440)
RBC: 4.31 MIL/uL (ref 3.80–5.20)
RDW: 15.1 % — AB (ref 11.5–14.5)
WBC: 4.1 10*3/uL (ref 3.6–11.0)

## 2017-02-15 LAB — TROPONIN I

## 2017-02-15 MED ORDER — IPRATROPIUM-ALBUTEROL 0.5-2.5 (3) MG/3ML IN SOLN
9.0000 mL | Freq: Once | RESPIRATORY_TRACT | Status: AC
  Administered 2017-02-15: 9 mL via RESPIRATORY_TRACT
  Filled 2017-02-15: qty 9

## 2017-02-15 MED ORDER — DEXTROSE 5 % IV SOLN
500.0000 mg | Freq: Once | INTRAVENOUS | Status: AC
  Administered 2017-02-15: 500 mg via INTRAVENOUS
  Filled 2017-02-15: qty 500

## 2017-02-15 MED ORDER — ALBUTEROL SULFATE (2.5 MG/3ML) 0.083% IN NEBU
2.5000 mg | INHALATION_SOLUTION | Freq: Once | RESPIRATORY_TRACT | Status: AC
  Administered 2017-02-15: 2.5 mg via RESPIRATORY_TRACT
  Filled 2017-02-15: qty 3

## 2017-02-15 MED ORDER — POTASSIUM CHLORIDE CRYS ER 20 MEQ PO TBCR
40.0000 meq | EXTENDED_RELEASE_TABLET | Freq: Once | ORAL | Status: AC
  Administered 2017-02-15: 40 meq via ORAL
  Filled 2017-02-15: qty 2

## 2017-02-15 MED ORDER — METHYLPREDNISOLONE SODIUM SUCC 125 MG IJ SOLR
125.0000 mg | Freq: Once | INTRAMUSCULAR | Status: AC
  Administered 2017-02-15: 125 mg via INTRAVENOUS
  Filled 2017-02-15: qty 2

## 2017-02-15 NOTE — ED Provider Notes (Signed)
[facility] Emergency Department Provider Note  ____________________________________________   First MD Initiated Contact with Patient 02/15/17 1952     (approximate)  I have reviewed the triage vital signs and the nursing notes.   HISTORY  Chief Complaint Shortness of Breath   HPI Krista Ortiz is a 83 y.o. female with a history of COPD as well as peripheral edema is presenting to the emergency department with 3 days of shortness of breath. She is denying any pain. Says she is a cough productive of clear sputum. Says that she is on home oxygen at night but has been using oxygen 24 hours day over the past 2 days as her breathing has worsened. She says that she usually has bilateral lower extremity edema which is stable at this time. Denies any fever.    Past Medical History:  Diagnosis Date  . Acoustic neuroma [facility])    md notes says history of acoustic neuroma or meningioma followed up by yearly MRI  . Chronic back pain   . COPD (chronic obstructive pulmonary disease) ([place])   . Hyperlipidemia   . Knee joint replacement status, right 11/25/2016  . Polycythemia   . Polycythemia, secondary 04/26/2015  . Port-a-cath in place   . PVD (peripheral vascular disease) ([place])   . Right wrist fracture 11/25/2016  . Thoracic compression fracture, closed, initial encounter ([place]) 11/25/2016  . TIA (transient ischemic attack)     Patient Active Problem List   Diagnosis Date Noted  . Polycythemia 04/02/2016  . Back ache 10/03/2015  . Chronic obstructive pulmonary disease ([place]) 10/03/2015  . Dizziness 10/03/2015  . Decrease in the ability to hear 10/03/2015  . Neoplasm of meninges 10/03/2015  . Combined fat and carbohydrate induced hyperlipemia 10/03/2015  . Neuroma 10/03/2015  . Impaired vision 10/03/2015  . Presence of other vascular implants and grafts 10/03/2015  . Current smoker 10/03/2015  . Temporary cerebral vascular dysfunction 10/03/2015  .  Abnormal CBC 06/12/2015  . Chronic LBP 06/12/2015  . Polycythemia, secondary 04/26/2015  . Hypersensitivity to pneumococcal vaccine 02/21/2015  . Carotid artery narrowing 02/19/2015  . Sick sinus syndrome ([place]) 02/16/2015  . Syncope and collapse 02/15/2015  . Edema leg 10/25/2014  . TI (tricuspid incompetence) 10/25/2014  . Breathlessness on exertion 10/25/2014  . Chest pain at rest 10/03/2014  . Dyspnea, paroxysmal nocturnal 10/03/2014  . Amnesia 04/02/2014  . Abnormal weight loss 04/02/2014  . Osteopenia 03/15/2014  . Chronic pain 07/06/2013  . Peripheral vascular disease ([place]) 06/27/2013  . Ejection murmur 12/09/2012    Past Surgical History:  Procedure Laterality Date  . ABDOMINAL HYSTERECTOMY    . APPENDECTOMY    . BACK SURGERY    . cataracts    . CHOLECYSTECTOMY    . PACEMAKER INSERTION Left 02/18/2015   Procedure: INSERTION PACEMAKER;  Surgeon: Isaias Cowman, MD;  Location: [facility] ORS;  Service: Cardiovascular;  Laterality: Left;  . REPLACEMENT TOTAL KNEE    . TONSILLECTOMY      Prior to Admission medications   Medication Sig Start Date End Date Taking? Authorizing Provider  acetaminophen (TYLENOL) 325 MG tablet Take 650 mg by mouth every 6 (six) hours as needed.    [provider]  albuterol (PROVENTIL) (2.5 MG/3ML) 0.083% nebulizer solution Inhale 3 mLs into the lungs every 6 (six) hours as needed. 12/23/15   [provider]  atorvastatin (LIPITOR) 10 MG tablet Take 10 mg by mouth daily.    [provider]  clopidogrel (PLAVIX) 75  MG tablet Take 75 mg by mouth daily.    [provider]  furosemide (LASIX) 20 MG tablet Take 20 mg by mouth daily.    [provider]  hydroxyurea (HYDREA) 500 MG capsule Take 500 mg by mouth 3 (three) times a week. Pt takes on Monday, Wednesday, and Friday.    [provider]  potassium chloride (K-DUR) 10 MEQ tablet Take 10 mEq by mouth daily.    [provider]  silver  sulfADIAZINE (SILVADENE) 1 % cream Apply to affected area daily 01/27/17   Earleen Newport, MD  [facility] HANDIHALER 18 MCG inhalation capsule Place 1 capsule into inhaler and inhale daily. 12/03/15   [provider]    Allergies Citalopram; Pneumococcal vaccine; Pneumovax [pneumococcal polysaccharide vaccine]; Requip [ropinirole hcl]; and Penicillins  Family History  Problem Relation Age of Onset  . Other Mother        Old age  . Other Father        Old age  . CVA Sister   . Heart attack Brother     Social History Social History  Substance Use Topics  . Smoking status: Current Every Day Smoker    Packs/day: 0.50    Types: Cigarettes  . Smokeless tobacco: Never Used  . Alcohol use No    Review of Systems  Constitutional: No fever/chills Eyes: No visual changes. ENT: No sore throat. Cardiovascular: Denies chest pain. Respiratory: as above Gastrointestinal: No abdominal pain.  No nausea, no vomiting.  No diarrhea.  No constipation. Genitourinary: Negative for dysuria. Musculoskeletal: Negative for back pain. Skin: Negative for rash. Neurological: Negative for headaches, focal weakness or numbness.   ____________________________________________   PHYSICAL EXAM:  VITAL SIGNS: ED Triage Vitals  Enc Vitals Group     BP 02/15/17 1938 (!) 163/103     Pulse Rate 02/15/17 1938 88     Resp 02/15/17 1938 (!) 28     Temp 02/15/17 1938 98.3 F (36.8 C)     Temp Source 02/15/17 1938 Oral     SpO2 02/15/17 1938 90 %     Weight 02/15/17 1940 164 lb 9.6 oz (74.7 kg)     Height 02/15/17 1940 5\' 5"  (1.651 m)     Head Circumference --      Peak Flow --      Pain Score 02/15/17 1938 0     Pain Loc --      Pain Edu? --      Excl. in [place]? --     Constitutional: Alert and oriented. Well appearing and in no acute distress.  On room air here in the emergency department the patient is desaturating to 88%. Ever, is in the low to mid 90s on nasal cannula  oxygen.  Eyes: Conjunctivae are normal.  Head: Atraumatic. Nose: No congestion/rhinnorhea.Wearing nasal cannula oxygen. Mouth/Throat: Mucous membranes are moist.  Neck: No stridor.   Cardiovascular: Normal rate, regular rhythm. Grossly normal heart sounds.   Respiratory: Tachypneic with prolonged history phase and coarse wheezing throughout all fields. Also with decreased air movement throughout all fields. Audible, raspy cough intermittently. Gastrointestinal: Soft and nontender. No distention. No CVA tenderness. Musculoskeletal: No lower extremity tenderness.  Bilateral lower extremity edema which is moderate and equal.  No joint effusions. Neurologic:  Normal speech and language. No gross focal neurologic deficits are appreciated. Skin:  Skin is warm, dry and intact. No rash noted. Psychiatric: Mood and affect are normal. Speech and behavior are normal.  ____________________________________________  LABS (all labs ordered are listed, but only abnormal results are displayed)  Labs Reviewed  BASIC METABOLIC PANEL - Abnormal; Notable for the following:       Result Value   Potassium 2.9 (*)    Chloride 97 (*)    Glucose, Bld 119 (*)    Creatinine, Ser 1.01 (*)    Calcium 8.6 (*)    GFR calc non Af Amer 47 (*)    GFR calc Af Amer 55 (*)    All other components within normal limits  CBC - Abnormal; Notable for the following:    RDW 15.1 (*)    All other components within normal limits  BRAIN NATRIURETIC PEPTIDE - Abnormal; Notable for the following:    B Natriuretic Peptide 168.0 (*)    All other components within normal limits  TROPONIN I   ____________________________________________  EKG  ED ECG REPORT I, Doran Stabler, the attending physician, personally viewed and interpreted this ECG.   Date: 02/15/2017  EKG Time: 1943  Rate: 75  Rhythm: normal sinus rhythm  Axis: normal  Intervals:nonspecific intraventricular conduction delay  ST&T Change: No ST segment  elevation or depression. No abnormal T-wave inversion. ST elevation read by the EKG machine in the inferior leads. However, this is likely due to baseline wander.  ____________________________________________  RADIOLOGY  No active cardiopulmonary disease on the chest x-ray.   ____________________________________________   PROCEDURES  Procedure(s) performed:   Procedures  Critical Care performed:   ____________________________________________   INITIAL IMPRESSION / ASSESSMENT AND PLAN / ED COURSE  Pertinent labs & imaging results that were available during my care of the patient were reviewed by me and considered in my medical decision making (see chart for details).    Clinical Course as of Feb 16 2112  Mon Feb 15, 2017  1956 DG Chest 2 View [DS]    Clinical Course User Index [DS] Orbie Pyo, MD   ----------------------------------------- 9:13 PM on 02/15/2017 -----------------------------------------  Patient with persistent wheezing despite neb treatments. To be admitted to the hospital. We'll replete potassium as well. Patient as well as family are aware of the plan for admission to the hospital and wanted to comply. Signed out to Dr. Ara Kussmaul.   ____________________________________________   FINAL CLINICAL IMPRESSION(S) / ED DIAGNOSES  COPD exacerbation.Hypokalemia    NEW MEDICATIONS STARTED DURING THIS VISIT:  New Prescriptions   No medications on file     Note:  This document was prepared using Dragon voice recognition software and may include unintentional dictation errors.     Orbie Pyo, MD 02/15/17 2114

## 2017-02-15 NOTE — H&P (Signed)
History and Physical   [facility] -  @ [facility] Admission History and Physical [employer], D.O.    Patient Name: Krista Ortiz MR#: [id number] Date of Birth: 02-Oct-1923 Date of Admission: 02/15/2017  Referring MD/NP/PA: Dr. Clearnce Hasten Primary Care Physician: Hortencia Pilar, MD Patient coming from: Home Outpatient Specialists: Dr. Raul Del   Chief Complaint:  Chief Complaint  Patient presents with  . Shortness of Breath    HPI: Krista Ortiz is a 83 y.o. female with a known history of COPD on nocturnal O2, hyperlipidemia, polycythemia, TIA presents to the emergency department for evaluation of shortness of breath.  Patient was in a usual state of health until 3 days ago when she describes worsening shortness of breath, dyspnea on exertion associated with a cough productive of clear to white sputum.. Patient denies fevers/chills, weakness, dizziness, chest pain, N/V/C/D, abdominal pain, dysuria/frequency, changes in mental status.    Otherwise there has been no change in status. Patient has been taking medication as prescribed and there has been no recent change in medication or diet.  No recent antibiotics.  There has been no recent illness, hospitalizations, travel or sick contacts.    EMS/ED Course: Patient received DuoNeb, albuterol, a azithromycin, Solu-Medrol and potassium.  Review of Systems:  CONSTITUTIONAL: No fever/chills, fatigue, weakness, weight gain/loss, headache. EYES: No blurry or double vision. ENT: No tinnitus, postnasal drip, redness or soreness of the oropharynx. RESPIRATORY: Positive cough, dyspnea, wheeze.  No hemoptysis.  CARDIOVASCULAR: No chest pain, palpitations, syncope, orthopnea. No lower extremity edema.  GASTROINTESTINAL: No nausea, vomiting, abdominal pain, diarrhea, constipation.  No hematemesis, melena or hematochezia. GENITOURINARY: No dysuria, frequency, hematuria. ENDOCRINE: No polyuria or nocturia. No heat or cold  intolerance. HEMATOLOGY: No anemia, bruising, bleeding. INTEGUMENTARY: No rashes, ulcers, lesions. MUSCULOSKELETAL: No arthritis, gout, dyspnea. NEUROLOGIC: No numbness, tingling, ataxia, seizure-type activity, weakness. PSYCHIATRIC: No anxiety, depression, insomnia.   Past Medical History:  Diagnosis Date  . Acoustic neuroma [facility])    md notes says history of acoustic neuroma or meningioma followed up by yearly MRI  . Chronic back pain   . COPD (chronic obstructive pulmonary disease) ([place])   . Hyperlipidemia   . Knee joint replacement status, right 11/25/2016  . Polycythemia   . Polycythemia, secondary 04/26/2015  . Port-a-cath in place   . PVD (peripheral vascular disease) ([place])   . Right wrist fracture 11/25/2016  . Thoracic compression fracture, closed, initial encounter ([place]) 11/25/2016  . TIA (transient ischemic attack)     Past Surgical History:  Procedure Laterality Date  . ABDOMINAL HYSTERECTOMY    . APPENDECTOMY    . BACK SURGERY    . cataracts    . CHOLECYSTECTOMY    . PACEMAKER INSERTION Left 02/18/2015   Procedure: INSERTION PACEMAKER;  Surgeon: Isaias Cowman, MD;  Location: [facility] ORS;  Service: Cardiovascular;  Laterality: Left;  . REPLACEMENT TOTAL KNEE    . TONSILLECTOMY       reports that she has been smoking Cigarettes.  She has been smoking about 0.50 packs per day. She has never used smokeless tobacco. She reports that she does not drink alcohol or use drugs.  Allergies: Citalopram, pneumonia vaccine, Requip and penicillin  Family History  Problem Relation Age of Onset  . Other Mother        Old age  . Other Father        Old age  . CVA Sister   . Heart attack Brother     Prior to Admission medications  Medication Sig Start Date End Date Taking? Authorizing Provider  acetaminophen (TYLENOL) 325 MG tablet Take 650 mg by mouth every 6 (six) hours as needed.    [provider]  albuterol (PROVENTIL) (2.5 MG/3ML) 0.083% nebulizer  solution Inhale 3 mLs into the lungs every 6 (six) hours as needed. 12/23/15   [provider]  atorvastatin (LIPITOR) 10 MG tablet Take 10 mg by mouth daily.    [provider]  clopidogrel (PLAVIX) 75 MG tablet Take 75 mg by mouth daily.    [provider]  furosemide (LASIX) 20 MG tablet Take 20 mg by mouth daily.    [provider]  hydroxyurea (HYDREA) 500 MG capsule Take 500 mg by mouth 3 (three) times a week. Pt takes on Monday, Wednesday, and Friday.    [provider]  potassium chloride (K-DUR) 10 MEQ tablet Take 10 mEq by mouth daily.    [provider]  silver sulfADIAZINE (SILVADENE) 1 % cream Apply to affected area daily 01/27/17   Earleen Newport, MD  [facility] HANDIHALER 18 MCG inhalation capsule Place 1 capsule into inhaler and inhale daily. 12/03/15   [provider]    Physical Exam: Vitals:   02/15/17 2030 02/15/17 2100 02/15/17 2130 02/15/17 2200  BP: (!) 141/71 139/61 (!) 164/81 (!) 159/80  Pulse: 70 74 72 77  Resp: 12 (!) 23 14 14   Temp:      TempSrc:      SpO2: 98% 94% 97% 95%  Weight:      Height:        GENERAL: 83 y.o.-year-old Chronically ill-appearing white female patient, lying in the bed in no acute distress.  Pleasant and cooperative.   HEENT: Head atraumatic, normocephalic. Pupils equal, round, reactive to light and accommodation. No scleral icterus. Extraocular muscles intact. Nares are patent. Oropharynx is clear. Mucus membranes moist. NECK: Supple, full range of motion. No JVD, no bruit heard. No thyroid enlargement, no tenderness, no cervical lymphadenopathy. CHEST: Diffuse wheezes No use of accessory muscles of respiration.  No reproducible chest wall tenderness.  CARDIOVASCULAR: S1, S2 normal. No murmurs, rubs, or gallops. Cap refill <2 seconds. Pulses intact distally.  ABDOMEN: Soft, nondistended, nontender. No rebound, guarding, rigidity. Normoactive bowel sounds present in all four  quadrants. No organomegaly or mass. EXTREMITIES: Mild bilateral nonpitting pedal edema.  No calf tenderness or Homan's sign.  NEUROLOGIC: The patient is alert and oriented x 3. Cranial nerves II through XII are grossly intact with no focal sensorimotor deficit. Muscle strength 5/5 in all extremities. Sensation intact. Gait not checked. PSYCHIATRIC:  Normal affect, mood, thought content. SKIN: Warm, dry, and intact without obvious rash, lesion, or ulcer.    Labs on Admission:  CBC:  Recent Labs Lab 02/15/17 1955  WBC 4.1  HGB 14.6  HCT 42.3  MCV 98.3  PLT 735   Basic Metabolic Panel:  Recent Labs Lab 02/15/17 1955  NA 138  K 2.9*  CL 97*  CO2 32  GLUCOSE 119*  BUN 12  CREATININE 1.01*  CALCIUM 8.6*   GFR: Estimated Creatinine Clearance: 36.7 mL/min (A) (by C-G formula based on SCr of 1.01 mg/dL (H)). Liver Function Tests: No results for input(s): AST, ALT, ALKPHOS, BILITOT, PROT, ALBUMIN in the last 168 hours. No results for input(s): LIPASE, AMYLASE in the last 168 hours. No results for input(s): AMMONIA in the last 168 hours. Coagulation Profile: No results for input(s): INR, PROTIME in the last 168 hours. Cardiac Enzymes:  Recent Labs  Lab 02/15/17 1955  TROPONINI <0.03   BNP (last 3 results) No results for input(s): PROBNP in the last 8760 hours. HbA1C: No results for input(s): HGBA1C in the last 72 hours. CBG: No results for input(s): GLUCAP in the last 168 hours. Lipid Profile: No results for input(s): CHOL, HDL, LDLCALC, TRIG, CHOLHDL, LDLDIRECT in the last 72 hours. Thyroid Function Tests: No results for input(s): TSH, T4TOTAL, FREET4, T3FREE, THYROIDAB in the last 72 hours. Anemia Panel: No results for input(s): VITAMINB12, FOLATE, FERRITIN, TIBC, IRON, RETICCTPCT in the last 72 hours. Urine analysis:    Component Value Date/Time   COLORURINE YELLOW (A) 11/30/2015 0338   APPEARANCEUR CLEAR (A) 11/30/2015 0338   LABSPEC 1.019 11/30/2015 0338    PHURINE 7.0 11/30/2015 0338   GLUCOSEU NEGATIVE 11/30/2015 0338   HGBUR 2+ (A) 11/30/2015 0338   BILIRUBINUR NEGATIVE 11/30/2015 0338   KETONESUR TRACE (A) 11/30/2015 0338   PROTEINUR 30 (A) 11/30/2015 0338   NITRITE NEGATIVE 11/30/2015 0338   LEUKOCYTESUR NEGATIVE 11/30/2015 0338   Sepsis Labs: @LABRCNTIP (procalcitonin:4,lacticidven:4) )No results found for this or any previous visit (from the past 240 hour(s)).   Radiological Exams on Admission: Dg Chest 2 View  Result Date: 02/15/2017 CLINICAL DATA:  Dyspnea, history of COPD EXAM: CHEST  2 VIEW COMPARISON:  01/07/2016 CXR FINDINGS: Heart is top-normal in size. There is aortic atherosclerosis without aneurysmal dilatation. Port catheter tip terminates in the distal SVC. Left-sided pacemaker apparatus is noted with right ventricular lead as before. Chronic interstitial prominence is noted without pneumonic consolidation, effusion or pneumothorax. There is multilevel degenerative disc disease of the visualized thoracic spine and osteoarthritis of the included right shoulder. IMPRESSION: No active cardiopulmonary disease. Mild chronic interstitial prominence bilaterally, nonspecific but may be related to age related interstitial fibrosis and/or chronic interstitial lung disease. Electronically Signed   By: Ashley Royalty M.D.   On: 02/15/2017 20:05    EKG: Normal sinus rhythm at 75 bpm with normal axis and nonspecific ST-T wave changes.   Assessment/Plan  This is a 83 y.o. female with a history of COPD on nocturnal O2, hyperlipidemia, polycythemia, TIA  now being admitted with:  #. Acute exacerbation of COPD - Admit inpatient - IV steroids and azithromycin - Nebulizers, O2 therapy and expectorants as needed.  - Continuous pulse oximetry - Consider pulmonary consult if not improving.   #. Hypokalemia, mild - Replace PO  - Repeat BMP in AM  #. History of hyperlipidemia -Continue Lipitor  #. History of TIA - Continue Plavix  #.  History of hypertension - Continue Lasix, potassium  #. History of polycythemia - Continue Plaquenil  Admission status: Inpatient IV Fluids: Normal saline Diet/Nutrition: Heart healthy Consults called: None  DVT Px: Lovenox, SCDs and early ambulation. Code Status: Full Code  Disposition Plan: To home in 1-2 days  All the records are reviewed and case discussed with ED provider. Management plans discussed with the patient and/or family who express understanding and agree with plan of care.  Maylynn Orzechowski D.O. on 02/15/2017 at 10:21 PM Between 7am to 6pm - Pager - [phone] After 6pm go to [email or web address] - [employer] [facility] Office [phone] CC: Primary care physician; Hortencia Pilar, MD   02/15/2017, 10:21 PM

## 2017-02-15 NOTE — ED Notes (Signed)
Pt called out stating she needed to urinate. Moved bed closer to toilet and pt ambulated without assistance to toilet at bedside. Pt stayed on oxygen while urinating. Pt brief changed. Pt appeared SOB with movement, states she felt about the same. 93% on 2 L nasal cannula after moving around.

## 2017-02-15 NOTE — ED Triage Notes (Signed)
Pt arrives via [facility] from home for SOB since Saturday. Pt appears SOB, tachypneic, labored. Pt is HOH. Pt denies pain except with deep breath. Pt was seen last week for fall, bruising noted to R shoulder and arm. Hx COPD, cardiac hx. Has port and pacemaker. EMS had 91% RA, placed on 3 L nasal cannula and came up to 95%. Congested productive cough. Pt states she wears cpap at night but denies during day oxygen.

## 2017-02-15 NOTE — ED Notes (Signed)
Pt was 90% on RA, placed on 2 L nasal cannula, came up to 95%.   Pt taken to xray via stretcher

## 2017-02-16 DIAGNOSIS — J441 Chronic obstructive pulmonary disease with (acute) exacerbation: Secondary | ICD-10-CM | POA: Diagnosis not present

## 2017-02-16 DIAGNOSIS — J449 Chronic obstructive pulmonary disease, unspecified: Secondary | ICD-10-CM | POA: Diagnosis present

## 2017-02-16 LAB — BASIC METABOLIC PANEL
ANION GAP: 8 (ref 5–15)
BUN: 11 mg/dL (ref 6–20)
CALCIUM: 8.6 mg/dL — AB (ref 8.9–10.3)
CO2: 32 mmol/L (ref 22–32)
Chloride: 102 mmol/L (ref 101–111)
Creatinine, Ser: 0.9 mg/dL (ref 0.44–1.00)
GFR, EST NON AFRICAN AMERICAN: 54 mL/min — AB (ref >60)
Glucose, Bld: 147 mg/dL — ABNORMAL HIGH (ref 65–99)
Potassium: 3.9 mmol/L (ref 3.5–5.1)
Sodium: 142 mmol/L (ref 135–145)

## 2017-02-16 LAB — PHOSPHORUS: Phosphorus: 2.8 mg/dL (ref 2.5–4.6)

## 2017-02-16 LAB — CBC
HCT: 42.4 % (ref 35.0–47.0)
HEMOGLOBIN: 14.3 g/dL (ref 12.0–16.0)
MCH: 32.9 pg (ref 26.0–34.0)
MCHC: 33.7 g/dL (ref 32.0–36.0)
MCV: 97.5 fL (ref 80.0–100.0)
Platelets: 276 10*3/uL (ref 150–440)
RBC: 4.35 MIL/uL (ref 3.80–5.20)
RDW: 15.1 % — ABNORMAL HIGH (ref 11.5–14.5)
WBC: 2.8 10*3/uL — ABNORMAL LOW (ref 3.6–11.0)

## 2017-02-16 LAB — MAGNESIUM: MAGNESIUM: 1.5 mg/dL — AB (ref 1.7–2.4)

## 2017-02-16 MED ORDER — BISACODYL 5 MG PO TBEC: 5.0000 mg | DELAYED_RELEASE_TABLET | Freq: Every day | ORAL | Status: DC | PRN

## 2017-02-16 MED ORDER — ONDANSETRON HCL 4 MG/2ML IJ SOLN: 4.0000 mg | Freq: Four times a day (QID) | INTRAMUSCULAR | Status: DC | PRN

## 2017-02-16 MED ORDER — AZITHROMYCIN 500 MG IV SOLR
500.0000 mg | INTRAVENOUS | Status: DC
  Filled 2017-02-16: qty 500

## 2017-02-16 MED ORDER — HEPARIN SOD (PORK) LOCK FLUSH 100 UNIT/ML IV SOLN
500.0000 [IU] | Freq: Once | INTRAVENOUS | Status: DC
  Filled 2017-02-16: qty 5

## 2017-02-16 MED ORDER — ATORVASTATIN CALCIUM 20 MG PO TABS
10.0000 mg | ORAL_TABLET | Freq: Every day | ORAL | Status: DC
  Administered 2017-02-16: 11:00:00 10 mg via ORAL
  Filled 2017-02-16: qty 1

## 2017-02-16 MED ORDER — CLOPIDOGREL BISULFATE 75 MG PO TABS
75.0000 mg | ORAL_TABLET | Freq: Every day | ORAL | Status: DC
  Administered 2017-02-16: 75 mg via ORAL
  Filled 2017-02-16: qty 1

## 2017-02-16 MED ORDER — DM-GUAIFENESIN ER 30-600 MG PO TB12: 1.0000 | ORAL_TABLET | Freq: Two times a day (BID) | ORAL | Status: DC

## 2017-02-16 MED ORDER — ACETAMINOPHEN 650 MG RE SUPP: 650.0000 mg | Freq: Four times a day (QID) | RECTAL | Status: DC | PRN

## 2017-02-16 MED ORDER — POTASSIUM CHLORIDE CRYS ER 20 MEQ PO TBCR
40.0000 meq | EXTENDED_RELEASE_TABLET | Freq: Two times a day (BID) | ORAL | Status: AC
  Administered 2017-02-16 (×2): 40 meq via ORAL
  Filled 2017-02-16 (×2): qty 2

## 2017-02-16 MED ORDER — SENNOSIDES-DOCUSATE SODIUM 8.6-50 MG PO TABS: 1.0000 | ORAL_TABLET | Freq: Every evening | ORAL | Status: DC | PRN

## 2017-02-16 MED ORDER — AZITHROMYCIN 250 MG PO TABS: ORAL_TABLET | ORAL | 0 refills | Status: AC

## 2017-02-16 MED ORDER — ENOXAPARIN SODIUM 40 MG/0.4ML ~~LOC~~ SOLN: 40.0000 mg | SUBCUTANEOUS | Status: DC

## 2017-02-16 MED ORDER — OXYCODONE HCL 5 MG PO TABS: 5.0000 mg | ORAL_TABLET | ORAL | Status: DC | PRN

## 2017-02-16 MED ORDER — IPRATROPIUM BROMIDE 0.02 % IN SOLN: 0.5000 mg | Freq: Four times a day (QID) | RESPIRATORY_TRACT | Status: DC | PRN

## 2017-02-16 MED ORDER — HYDROXYUREA 500 MG PO CAPS: 500.0000 mg | ORAL_CAPSULE | ORAL | Status: DC

## 2017-02-16 MED ORDER — ONDANSETRON HCL 4 MG PO TABS: 4.0000 mg | ORAL_TABLET | Freq: Four times a day (QID) | ORAL | Status: DC | PRN

## 2017-02-16 MED ORDER — ALBUTEROL SULFATE (2.5 MG/3ML) 0.083% IN NEBU: 2.5000 mg | INHALATION_SOLUTION | Freq: Four times a day (QID) | RESPIRATORY_TRACT | Status: DC | PRN

## 2017-02-16 MED ORDER — ACETAMINOPHEN 325 MG PO TABS: 650.0000 mg | ORAL_TABLET | Freq: Four times a day (QID) | ORAL | Status: DC | PRN

## 2017-02-16 MED ORDER — SODIUM CHLORIDE 0.9 % IV SOLN
INTRAVENOUS | Status: DC
  Administered 2017-02-16: 01:00:00 via INTRAVENOUS

## 2017-02-16 MED ORDER — METHYLPREDNISOLONE SODIUM SUCC 125 MG IJ SOLR
60.0000 mg | Freq: Four times a day (QID) | INTRAMUSCULAR | Status: DC
  Administered 2017-02-16 (×2): 60 mg via INTRAVENOUS
  Filled 2017-02-16 (×2): qty 2

## 2017-02-16 MED ORDER — MAGNESIUM CITRATE PO SOLN
1.0000 | Freq: Once | ORAL | Status: DC | PRN
  Filled 2017-02-16: qty 296

## 2017-02-16 MED ORDER — PREDNISONE 10 MG (21) PO TBPK: ORAL_TABLET | ORAL | 0 refills | Status: DC

## 2017-02-16 MED ORDER — POTASSIUM CHLORIDE CRYS ER 10 MEQ PO TBCR
10.0000 meq | EXTENDED_RELEASE_TABLET | Freq: Every day | ORAL | Status: DC
  Administered 2017-02-16: 11:00:00 10 meq via ORAL
  Filled 2017-02-16: qty 1

## 2017-02-16 MED ORDER — DEXTROMETHORPHAN POLISTIREX ER 30 MG/5ML PO SUER
30.0000 mg | Freq: Two times a day (BID) | ORAL | Status: DC
  Administered 2017-02-16 (×2): 30 mg via ORAL
  Filled 2017-02-16 (×3): qty 5

## 2017-02-16 MED ORDER — FUROSEMIDE 20 MG PO TABS
20.0000 mg | ORAL_TABLET | Freq: Two times a day (BID) | ORAL | Status: DC
  Administered 2017-02-16: 11:00:00 20 mg via ORAL
  Filled 2017-02-16: qty 1

## 2017-02-16 MED ORDER — ORAL CARE MOUTH RINSE
15.0000 mL | Freq: Two times a day (BID) | OROMUCOSAL | Status: DC
  Administered 2017-02-16 (×2): 15 mL via OROMUCOSAL

## 2017-02-16 MED ORDER — SODIUM CHLORIDE 0.9% FLUSH: 10.0000 mL | INTRAVENOUS | Status: DC | PRN

## 2017-02-16 MED ORDER — GUAIFENESIN ER 600 MG PO TB12
600.0000 mg | ORAL_TABLET | Freq: Two times a day (BID) | ORAL | Status: DC
  Administered 2017-02-16 (×2): 600 mg via ORAL
  Filled 2017-02-16 (×2): qty 1

## 2017-02-16 NOTE — Care Management Obs Status (Signed)
[facility] NOTIFICATION   Patient Details  Name: Krista Ortiz MRN: [id number] Date of Birth: 07/04/1924   Medicare Observation Status Notification Given:       Shelbie Ammons, RN 02/16/2017, 11:17 AM

## 2017-02-16 NOTE — Plan of Care (Signed)
Problem: Education: Goal: Knowledge of [facility] Education information/materials will improve Outcome: Progressing VSS, free of falls.  Oriented to unit, including falls precautions, call bell.  Ambulated to BR x3 since arriving to unit, standby assist, tolerated well.  Desats to high 80%s while ambulating, recovers to low-mid 90%s when back in bed.  Denies pain.  Port accessed, PIV flushes well.  Bed in low position, call bell within reach.  WCTM.

## 2017-02-16 NOTE — Care Management (Signed)
Admitted to [facility] with the diagnosis of COPD as inpatient, changed to observation. Lives alone. Brother is Jeneen Rinks [phone]).  Seen Dr. Hoy Morn last Friday. Prescription are filled at [facility] in [place]. Wears home oxygen at night only. [facility] supplies home oxygen for many years. No home Health. No skilled facility. Cane, rolling walker, and whelchair in the home. Last fall was 2 weeks ago. Good appetite. Son will transport. Shelbie Ammons RN MSN [facility] Care Management [phone]

## 2017-02-16 NOTE — Discharge Summary (Signed)
Krista Ortiz, is a 83 y.o. female  DOB [birth year]  MRN [id number].  Admission date:  02/15/2017  Admitting Physician  Harvie Bridge, DO  Discharge Date:  02/16/2017   Primary MD  Hortencia Pilar, MD  Recommendations for primary care physician for things to follow:  Follow  with PCP in week   Admission Diagnosis  Hypokalemia [E87.6] COPD exacerbation ([place]) [J44.1]   Discharge Diagnosis  Hypokalemia [E87.6] COPD exacerbation ([place]) [J44.1]    Active Problems:   COPD exacerbation ([facility])   COPD (chronic obstructive pulmonary disease) ([facility])      Past Medical History:  Diagnosis Date  . Acoustic neuroma [facility])    md notes says history of acoustic neuroma or meningioma followed up by yearly MRI  . Chronic back pain   . COPD (chronic obstructive pulmonary disease) ([place])   . Hyperlipidemia   . Knee joint replacement status, right 11/25/2016  . Polycythemia   . Polycythemia, secondary 04/26/2015  . Port-a-cath in place   . PVD (peripheral vascular disease) ([place])   . Right wrist fracture 11/25/2016  . Thoracic compression fracture, closed, initial encounter ([place]) 11/25/2016  . TIA (transient ischemic attack)     Past Surgical History:  Procedure Laterality Date  . ABDOMINAL HYSTERECTOMY    . APPENDECTOMY    . BACK SURGERY    . cataracts    . CHOLECYSTECTOMY    . PACEMAKER INSERTION Left 02/18/2015   Procedure: INSERTION PACEMAKER;  Surgeon: Isaias Cowman, MD;  Location: [facility] ORS;  Service: Cardiovascular;  Laterality: Left;  . REPLACEMENT TOTAL KNEE    . TONSILLECTOMY         History of present illness and  Hospital Course:     Kindly see H&P for history of present illness and admission details, please review complete Labs, Consult reports and Test reports for all details in brief  HPI  from the  history and physical done on the day of admission 83 year old female patient with history of COPD on nocturnal oxygen, hyperlipidemia, polycythemia came in because of shortness of breath, cough. Admitted for acute bronchitis and COPD exacerbation.  Hospital Course  #1 acute bronchitis and acute COPD exacerbation: Started on IV steroids, azithromycin, nebulizers, symptoms improved, less wheezing today and she wants to go home. Discharge home with prednisone Dosepak, azithromycin as a Z-Pak, patient has nebulizers at home she can continue albuterol every 6 hours for a day and then changed to as needed. Oxygen saturation 96% on 2 L, patient can use nocturnal oxygen as she was doing before. #2 polycythemia: Patient is on hydroxyurea.  #3 history of previous stroke: Patient lives  By  herself and has no focal deficits and uses cane occasionally and also uses walker as needed.,does shopping  Also continue statins, Plavix,. #4 .hypokalemia due to diuretics: Potassium is being replaced, potassium today is 3.9. D/w  with patient's son also. Discharge Condition:stable  Follow UP  Follow-up Information    Hortencia Pilar, MD Follow up in 1 week(s).   Specialty:  Family Medicine Contact information: [place] [address] [phone]             Discharge Instructions  and  Discharge Medications         Diet and Activity recommendation: See Discharge Instructions above   Consults obtained - none   Major procedures and Radiology Reports - PLEASE review detailed and final reports for all details, in brief -      Dg Chest 2  View  Result Date: 02/15/2017 CLINICAL DATA:  Dyspnea, history of COPD EXAM: CHEST  2 VIEW COMPARISON:  01/07/2016 CXR FINDINGS: Heart is top-normal in size. There is aortic atherosclerosis without aneurysmal dilatation. Port catheter tip terminates in the distal SVC. Left-sided pacemaker apparatus is noted with right ventricular lead as before.  Chronic interstitial prominence is noted without pneumonic consolidation, effusion or pneumothorax. There is multilevel degenerative disc disease of the visualized thoracic spine and osteoarthritis of the included right shoulder. IMPRESSION: No active cardiopulmonary disease. Mild chronic interstitial prominence bilaterally, nonspecific but may be related to age related interstitial fibrosis and/or chronic interstitial lung disease. Electronically Signed   By: Ashley Royalty M.D.   On: 02/15/2017 20:05   Ct Head Wo Contrast  Result Date: 01/27/2017 CLINICAL DATA:  Lost balance and fell. EXAM: CT HEAD WITHOUT CONTRAST TECHNIQUE: Contiguous axial images were obtained from the base of the skull through the vertex without intravenous contrast. COMPARISON:  Head CT 02/15/2015.  Brain MRI 03/07/2010 FINDINGS: Brain: Again noted is a dense lesion in the left cerebellopontine angle that measures up to 1.8 cm. This has been previously described as a meningioma. Stable mineralization or calcifications in the left basal ganglia. Negative for acute hemorrhage, midline shift, hydrocephalus or large new infarct. Stable cerebral atrophy. Stable focal atrophy or encephalomalacia in the left occipital lobe region. Vascular: No hyperdense vessel or unexpected calcification. Skull: Scalp hematoma along the right forehead. No underlying fracture. Sinuses/Orbits: Mild mucosal thickening in the ethmoid air cells. Mucosal disease in the left maxillary sinus and left sphenoid sinus. Small amount of fluid in the right mastoid air cells. Other: None IMPRESSION: No acute intracranial abnormality. Right forehead scalp hematoma.  No underlying fracture. Stable meningioma at the left cerebellopontine angle. Mild paranasal sinus disease. Electronically Signed   By: Markus Daft M.D.   On: 01/27/2017 15:11   Dg Humerus Right  Result Date: 01/27/2017 CLINICAL DATA:  Recent fall with low right arm pain, initial encounter EXAM: RIGHT HUMERUS - 2+  VIEW COMPARISON:  None. FINDINGS: Degenerative changes of the acromioclavicular joint are noted. The humeral head is high-riding consistent with chronic rotator cuff injury. No definitive humeral fracture is seen. Soft tissue swelling is noted over the shoulder. IMPRESSION: No acute bony abnormality is noted. Mild soft tissue swelling is noted. Electronically Signed   By: Inez Catalina M.D.   On: 01/27/2017 15:16    Micro Results     No results found for this or any previous visit (from the past 240 hour(s)).     Today   Subjective:   Krista Ortiz today has No shortness of breath, stable for discharge.  Blood pressure (!) 140/53, pulse 83, temperature 97.8 F (36.6 C), temperature source Oral, resp. rate 20, height 5\' 5"  (1.651 m), weight 68.4 kg (150 lb 14.4 oz), SpO2 96 %.   Intake/Output Summary (Last 24 hours) at 02/16/17 1234 Last data filed at 02/16/17 0944  Gross per 24 hour  Intake              675 ml  Output                0 ml  Net              675 ml    Exam Awake Alert, Oriented x 3, No new F.N deficits, Normal affect [place].AT,PERRAL Supple Neck,No JVD, No cervical lymphadenopathy appriciated.  Symmetrical Chest wall movement, Good air movement bilaterally, CTAB RRR,No Gallops,Rubs or new Murmurs,  No Parasternal Heave +ve B.Sounds, Abd Soft, Non tender, No organomegaly appriciated, No rebound -guarding or rigidity. No Cyanosis, Clubbing or edema, No new Rash or bruise  Data Review   CBC w Diff:  Lab Results  Component Value Date   WBC 2.8 (L) 02/16/2017   HGB 14.3 02/16/2017   HGB 13.4 01/04/2015   HCT 42.4 02/16/2017   HCT 41.3 01/04/2015   PLT 276 02/16/2017   PLT 395 01/04/2015   LYMPHOPCT 30 11/03/2016   LYMPHOPCT 29.3 01/04/2015   MONOPCT 10 11/03/2016   MONOPCT 10.4 01/04/2015   EOSPCT 2 11/03/2016   EOSPCT 1.9 01/04/2015   BASOPCT 1 11/03/2016   BASOPCT 0.8 01/04/2015    CMP:  Lab Results  Component Value Date   NA 142 02/16/2017   NA  138 01/12/2014   K 3.9 02/16/2017   K 3.1 (L) 01/12/2014   CL 102 02/16/2017   CL 102 01/12/2014   CO2 32 02/16/2017   CO2 31 01/12/2014   BUN 11 02/16/2017   BUN 14 01/12/2014   CREATININE 0.90 02/16/2017   CREATININE 1.27 01/12/2014   PROT 6.3 (L) 01/26/2017   PROT 7.3 01/12/2014   ALBUMIN 3.7 01/26/2017   ALBUMIN 3.5 01/12/2014   BILITOT 0.5 01/26/2017   BILITOT 0.4 01/12/2014   ALKPHOS 83 01/26/2017   ALKPHOS 113 01/12/2014   AST 21 01/26/2017   AST 22 01/12/2014   ALT 15 01/26/2017   ALT 12 01/12/2014  .   Total Time in preparing paper work, data evaluation and todays exam - 22 minutes  Sheryll Dymek M.D on 02/16/2017 at 12:34 PM    Note: This dictation was prepared with Dragon dictation along with smaller phrase technology. Any transcriptional errors that result from this process are unintentional.

## 2017-02-16 NOTE — Progress Notes (Signed)
Allen went to PT room on a consult. PT had requested AD information. Upon arriving to the room [facility] was informed that PT had AD packed already. [place] prayed with PT and offered any assistance she may need. PT was not ready to complete AD.   02/16/17 1050  Clinical Encounter Type  Visited With Patient  Visit Type Initial  Referral From Nurse  Consult/Referral To Chaplain  Spiritual Encounters  Spiritual Needs Prayer;Emotional

## 2017-02-16 NOTE — Consult Note (Signed)
[facility] Nurse wound consult note Reason for Consult: Skin tear to right upper arm.  Had a fall at home and this area has never resolved.  SKin tear is bleeding this AM.   Wound type:Trauma Pressure Injury POA: N/A Measurement: 6 cm x 1.1 x 0.2 cm skin defect noted, edges are approximated. Wound RPZ:PSUGAYGE and red Drainage (amount, consistency, odor) moderate bleeding.  Gelatinous clots present Periwound:paper thin skin Dressing procedure/placement/frequency:Cleanse skin tear to right arm withNS.  Silver hydrofiber dressing to promote cessation of bleeding.  Cover with self adherent coban (not too tightly).  Will change daily and PRN bleeding strikethrough.  [place] team will follow.   Domenic Moras RN BSN [place] Pager [phone]

## 2017-02-16 NOTE — Progress Notes (Signed)
Pt for discharge home. Alert / responsive. No severe distress.  Some dyspnea with exertion. Pt saw prior to discharge.  Instructions discussed with pt and her son.  meds / diet / activity and f/u discussed.  Sl and port deaccessed and  tol well. Out via w/c  With son w/o c/o.

## 2017-03-09 ENCOUNTER — Inpatient Hospital Stay: Payer: Medicare Other | Attending: Oncology

## 2017-03-09 DIAGNOSIS — D751 Secondary polycythemia: Secondary | ICD-10-CM | POA: Insufficient documentation

## 2017-03-09 DIAGNOSIS — Z95828 Presence of other vascular implants and grafts: Secondary | ICD-10-CM

## 2017-03-09 DIAGNOSIS — Z452 Encounter for adjustment and management of vascular access device: Secondary | ICD-10-CM | POA: Diagnosis not present

## 2017-03-09 MED ORDER — SODIUM CHLORIDE 0.9% FLUSH
10.0000 mL | INTRAVENOUS | Status: DC | PRN
  Administered 2017-03-09: 10 mL via INTRAVENOUS
  Filled 2017-03-09: qty 10

## 2017-03-09 MED ORDER — HEPARIN SOD (PORK) LOCK FLUSH 100 UNIT/ML IV SOLN
500.0000 [IU] | Freq: Once | INTRAVENOUS | Status: AC
  Administered 2017-03-09: 500 [IU] via INTRAVENOUS

## 2017-03-18 ENCOUNTER — Emergency Department
Admission: EM | Admit: 2017-03-18 | Discharge: 2017-03-18 | Disposition: A | Discharge status: Home / Self Care | Payer: Medicare Other | Attending: Emergency Medicine | Admitting: Emergency Medicine

## 2017-03-18 ENCOUNTER — Encounter: Payer: Self-pay | Admitting: Emergency Medicine

## 2017-03-18 ENCOUNTER — Emergency Department: Payer: Medicare Other

## 2017-03-18 DIAGNOSIS — W07XXXA Fall from chair, initial encounter: Secondary | ICD-10-CM | POA: Diagnosis not present

## 2017-03-18 DIAGNOSIS — Y998 Other external cause status: Secondary | ICD-10-CM | POA: Insufficient documentation

## 2017-03-18 DIAGNOSIS — Z87891 Personal history of nicotine dependence: Secondary | ICD-10-CM | POA: Diagnosis not present

## 2017-03-18 DIAGNOSIS — J441 Chronic obstructive pulmonary disease with (acute) exacerbation: Secondary | ICD-10-CM | POA: Diagnosis not present

## 2017-03-18 DIAGNOSIS — Z7902 Long term (current) use of antithrombotics/antiplatelets: Secondary | ICD-10-CM | POA: Insufficient documentation

## 2017-03-18 DIAGNOSIS — Z79899 Other long term (current) drug therapy: Secondary | ICD-10-CM | POA: Diagnosis not present

## 2017-03-18 DIAGNOSIS — Z96651 Presence of right artificial knee joint: Secondary | ICD-10-CM | POA: Insufficient documentation

## 2017-03-18 DIAGNOSIS — M533 Sacrococcygeal disorders, not elsewhere classified: Secondary | ICD-10-CM | POA: Insufficient documentation

## 2017-03-18 DIAGNOSIS — Y9389 Activity, other specified: Secondary | ICD-10-CM | POA: Insufficient documentation

## 2017-03-18 DIAGNOSIS — Y929 Unspecified place or not applicable: Secondary | ICD-10-CM | POA: Insufficient documentation

## 2017-03-18 NOTE — ED Notes (Signed)
Patient given a pillow for positioning.

## 2017-03-18 NOTE — Discharge Instructions (Signed)
Use doughnut cushion as needed for the next 4-6 weeks.
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
## 2017-03-18 NOTE — ED Provider Notes (Signed)
[facility] Emergency Department Provider Note   ____________________________________________   First MD Initiated Contact with Patient 03/18/17 1323     (approximate)  I have reviewed the triage vital signs and the nursing notes.   HISTORY  Chief Complaint Fall and Tailbone Pain    HPI Krista Ortiz is a 83 y.o. female patient complaining of tailbone pain secondary to a fall. Patient has a recliner did assist her to standing and it flipped over. Incident occurred approximately 1-1/2 weeks ago. Patient did not report this to her PCP. Patient's states placing most of her weight on the right buttocks secondary to toxic pain. Patient denied a bladder or bowel dysfunction from this fall. Patient denies radicular component to this fall. No palliative measures for complaint. Patient rates pain as a 10 over 10. Patient described a pain as "achy".  Past Medical History:  Diagnosis Date  . Acoustic neuroma [facility])    md notes says history of acoustic neuroma or meningioma followed up by yearly MRI  . Chronic back pain   . COPD (chronic obstructive pulmonary disease) ([place])   . Hyperlipidemia   . Knee joint replacement status, right 11/25/2016  . Polycythemia   . Polycythemia, secondary 04/26/2015  . Port-a-cath in place   . PVD (peripheral vascular disease) ([place])   . Right wrist fracture 11/25/2016  . Thoracic compression fracture, closed, initial encounter ([place]) 11/25/2016  . TIA (transient ischemic attack)     Patient Active Problem List   Diagnosis Date Noted  . COPD (chronic obstructive pulmonary disease) ([place]) 02/16/2017  . COPD exacerbation ([place]) 02/15/2017  . Polycythemia 04/02/2016  . Back ache 10/03/2015  . Chronic obstructive pulmonary disease ([place]) 10/03/2015  . Dizziness 10/03/2015  . Decrease in the ability to hear 10/03/2015  . Neoplasm of meninges 10/03/2015  . Combined fat and carbohydrate induced hyperlipemia 10/03/2015  . Neuroma  10/03/2015  . Impaired vision 10/03/2015  . Presence of other vascular implants and grafts 10/03/2015  . Current smoker 10/03/2015  . Temporary cerebral vascular dysfunction 10/03/2015  . Abnormal CBC 06/12/2015  . Chronic LBP 06/12/2015  . Polycythemia, secondary 04/26/2015  . Hypersensitivity to pneumococcal vaccine 02/21/2015  . Carotid artery narrowing 02/19/2015  . Sick sinus syndrome ([place]) 02/16/2015  . Syncope and collapse 02/15/2015  . Edema leg 10/25/2014  . TI (tricuspid incompetence) 10/25/2014  . Breathlessness on exertion 10/25/2014  . Chest pain at rest 10/03/2014  . Dyspnea, paroxysmal nocturnal 10/03/2014  . Amnesia 04/02/2014  . Abnormal weight loss 04/02/2014  . Osteopenia 03/15/2014  . Chronic pain 07/06/2013  . Peripheral vascular disease ([place]) 06/27/2013  . Ejection murmur 12/09/2012    Past Surgical History:  Procedure Laterality Date  . ABDOMINAL HYSTERECTOMY    . APPENDECTOMY    . BACK SURGERY    . cataracts    . CHOLECYSTECTOMY    . PACEMAKER INSERTION Left 02/18/2015   Procedure: INSERTION PACEMAKER;  Surgeon: Isaias Cowman, MD;  Location: [facility] ORS;  Service: Cardiovascular;  Laterality: Left;  . REPLACEMENT TOTAL KNEE    . TONSILLECTOMY      Prior to Admission medications   Medication Sig Start Date End Date Taking? Authorizing Provider  acetaminophen (TYLENOL) 325 MG tablet Take 650 mg by mouth every 6 (six) hours as needed.    [provider]  albuterol (PROVENTIL) (2.5 MG/3ML) 0.083% nebulizer solution Inhale 3 mLs into the lungs every 6 (six) hours as needed. 12/23/15   [provider]  atorvastatin (  LIPITOR) 10 MG tablet Take 10 mg by mouth daily.    [provider]  clopidogrel (PLAVIX) 75 MG tablet Take 75 mg by mouth daily.    [provider]  furosemide (LASIX) 20 MG tablet Take 20 mg by mouth 2 (two) times daily.     [provider]  hydroxyurea (HYDREA) 500 MG capsule Take 500 mg by  mouth 3 (three) times a week. Pt takes on Monday, Wednesday, and Friday.    [provider]  potassium chloride (K-DUR) 10 MEQ tablet Take 10 mEq by mouth daily.    [provider]  predniSONE (STERAPRED UNI-PAK 21 TAB) 10 MG (21) TBPK tablet Take as prescribed 02/16/17   Epifanio Lesches, MD    Allergies Citalopram; Pneumococcal vaccine; Pneumovax [pneumococcal polysaccharide vaccine]; Requip [ropinirole hcl]; and Penicillins  Family History  Problem Relation Age of Onset  . Other Mother        Old age  . Other Father        Old age  . CVA Sister   . Heart attack Brother     Social History Social History  Substance Use Topics  . Smoking status: Former Smoker    Packs/day: 0.50    Types: Cigarettes    Quit date: 02/05/2017  . Smokeless tobacco: Never Used  . Alcohol use No    Review of Systems  Constitutional: No fever/chills Eyes: No visual changes. ENT: No sore throat. Cardiovascular: Denies chest pain. Respiratory: Denies shortness of breath. Gastrointestinal: No abdominal pain.  No nausea, no vomiting.  No diarrhea.  No constipation. Genitourinary: Negative for dysuria. Musculoskeletal: Chronic back pain Skin: Negative for rash. Neurological: Negative for headaches, focal weakness or numbness. Endocrine:Hyperlipidemia Allergic/Immunilogical: See medication list ____________________________________________   PHYSICAL EXAM:  VITAL SIGNS: ED Triage Vitals  Enc Vitals Group     BP 03/18/17 1043 134/67     Pulse Rate 03/18/17 1043 83     Resp 03/18/17 1043 18     Temp 03/18/17 1043 97.8 F (36.6 C)     Temp Source 03/18/17 1043 Oral     SpO2 03/18/17 1043 96 %     Weight 03/18/17 1044 150 lb (68 kg)     Height 03/18/17 1044 5\' 2"  (1.575 m)     Head Circumference --      Peak Flow --      Pain Score 03/18/17 1043 10     Pain Loc --      Pain Edu? --      Excl. in [place]? --     Constitutional: Alert and oriented. Well appearing and in  no acute distress. Cardiovascular: Normal rate, regular rhythm. Grossly normal heart sounds.  Good peripheral circulation. Respiratory: Normal respiratory effort.  No retractions. Lungs CTAB. Gastrointestinal: Soft and nontender. No distention. No abdominal bruits. No CVA tenderness. Musculoskeletal: No lower extremity tenderness nor edema.  No joint effusions. Neurologic:  Normal speech and language. No gross focal neurologic deficits are appreciated. No gait instability. Skin:  Skin is warm, dry and intact. No rash noted. Psychiatric: Mood and affect are normal. Speech and behavior are normal.  ____________________________________________   LABS (all labs ordered are listed, but only abnormal results are displayed)  Labs Reviewed - No data to display ____________________________________________  EKG   ____________________________________________  RADIOLOGY  Dg Lumbar Spine Complete  Result Date: 03/18/2017 CLINICAL DATA:  One week ago.  Back pain EXAM: LUMBAR SPINE - COMPLETE 4+ VIEW COMPARISON:  02/15/2015 FINDINGS: Changes of  vertebroplasty at L4 with posterior fusion changes from L3-L5. No acute bony abnormality. No fracture. Degenerative disc and facet disease throughout the lumbar spine. Diffuse osteopenia. IMPRESSION: Postoperative changes as above. Osteopenia and degenerative changes. No acute findings. Electronically Signed   By: Rolm Baptise M.D.   On: 03/18/2017 12:00   Dg Sacrum/coccyx  Result Date: 03/18/2017 CLINICAL DATA:  Coccyx pain for 1 week.  Fall. EXAM: SACRUM AND COCCYX - 2+ VIEW COMPARISON:  CT 11/25/2016 FINDINGS: Postoperative changes in the visualized lower lumbar spine. Prior vertebroplasty at L4. No visible sacral or coccygeal fracture. IMPRESSION: No evidence of sacral or coccygeal fracture. Electronically Signed   By: Rolm Baptise M.D.   On: 03/18/2017 11:29    ____________________________________________   PROCEDURES  Procedure(s) performed:  None  Procedures  Critical Care performed: No  ____________________________________________   INITIAL IMPRESSION / ASSESSMENT AND PLAN / ED COURSE  Pertinent labs & imaging results that were available during my care of the patient were reviewed by me and considered in my medical decision making (see chart for details).  Coccyx contusion secondary to fall. Discussed negative lumbar and sacral x-rays with patient and son. Patient given discharge care instructions and advised to use a doughnut cushion for comfort. Patient advised to follow-up with PCP.      ____________________________________________   FINAL CLINICAL IMPRESSION(S) / ED DIAGNOSES  Final diagnoses:  Coccydynia      NEW MEDICATIONS STARTED DURING THIS VISIT:  Discharge Medication List as of 03/18/2017  1:32 PM       Note:  This document was prepared using Dragon voice recognition software and may include unintentional dictation errors.    Sable Feil, PA-C 03/18/17 1349    Harvest Dark, MD 03/18/17 [phone]

## 2017-04-20 ENCOUNTER — Inpatient Hospital Stay: Payer: Medicare Other | Attending: Oncology

## 2017-04-20 DIAGNOSIS — Z95828 Presence of other vascular implants and grafts: Secondary | ICD-10-CM

## 2017-04-20 DIAGNOSIS — D473 Essential (hemorrhagic) thrombocythemia: Secondary | ICD-10-CM

## 2017-04-20 DIAGNOSIS — D75839 Thrombocytosis, unspecified: Secondary | ICD-10-CM

## 2017-04-20 DIAGNOSIS — D751 Secondary polycythemia: Secondary | ICD-10-CM | POA: Diagnosis not present

## 2017-04-20 LAB — CBC WITH DIFFERENTIAL/PLATELET
Basophils Absolute: 0.2 10*3/uL — ABNORMAL HIGH (ref 0–0.1)
Basophils Relative: 6 %
Eosinophils Absolute: 0 10*3/uL (ref 0–0.7)
Eosinophils Relative: 1 %
HEMATOCRIT: 40.5 % (ref 35.0–47.0)
HEMOGLOBIN: 14.1 g/dL (ref 12.0–16.0)
LYMPHS ABS: 0.7 10*3/uL — AB (ref 1.0–3.6)
LYMPHS PCT: 21 %
MCH: 34.3 pg — AB (ref 26.0–34.0)
MCHC: 34.9 g/dL (ref 32.0–36.0)
MCV: 98.3 fL (ref 80.0–100.0)
MONO ABS: 0.3 10*3/uL (ref 0.2–0.9)
MONOS PCT: 8 %
NEUTROS ABS: 2.3 10*3/uL (ref 1.4–6.5)
NEUTROS PCT: 64 %
Platelets: 320 10*3/uL (ref 150–440)
RBC: 4.12 MIL/uL (ref 3.80–5.20)
RDW: 14.5 % (ref 11.5–14.5)
WBC: 3.6 10*3/uL (ref 3.6–11.0)

## 2017-04-20 LAB — COMPREHENSIVE METABOLIC PANEL
ALK PHOS: 76 U/L (ref 38–126)
ALT: 12 U/L — ABNORMAL LOW (ref 14–54)
AST: 20 U/L (ref 15–41)
Albumin: 3.7 g/dL (ref 3.5–5.0)
Anion gap: 10 (ref 5–15)
BILIRUBIN TOTAL: 0.6 mg/dL (ref 0.3–1.2)
BUN: 13 mg/dL (ref 6–20)
CALCIUM: 8.9 mg/dL (ref 8.9–10.3)
CHLORIDE: 100 mmol/L — AB (ref 101–111)
CO2: 29 mmol/L (ref 22–32)
CREATININE: 1.12 mg/dL — AB (ref 0.44–1.00)
GFR calc Af Amer: 48 mL/min — ABNORMAL LOW (ref >60)
GFR, EST NON AFRICAN AMERICAN: 42 mL/min — AB (ref >60)
Glucose, Bld: 118 mg/dL — ABNORMAL HIGH (ref 65–99)
Potassium: 3.6 mmol/L (ref 3.5–5.1)
Sodium: 139 mmol/L (ref 135–145)
TOTAL PROTEIN: 6.3 g/dL — AB (ref 6.5–8.1)

## 2017-04-20 MED ORDER — HEPARIN SOD (PORK) LOCK FLUSH 100 UNIT/ML IV SOLN
500.0000 [IU] | Freq: Once | INTRAVENOUS | Status: AC
  Administered 2017-04-20: 500 [IU] via INTRAVENOUS

## 2017-04-20 MED ORDER — SODIUM CHLORIDE 0.9% FLUSH
10.0000 mL | INTRAVENOUS | Status: DC | PRN
  Administered 2017-04-20: 10 mL via INTRAVENOUS
  Filled 2017-04-20: qty 10

## 2017-06-01 ENCOUNTER — Inpatient Hospital Stay: Payer: Medicare Other | Attending: Oncology

## 2017-06-01 DIAGNOSIS — Z95828 Presence of other vascular implants and grafts: Secondary | ICD-10-CM

## 2017-06-01 DIAGNOSIS — D751 Secondary polycythemia: Secondary | ICD-10-CM | POA: Diagnosis not present

## 2017-06-01 DIAGNOSIS — F17210 Nicotine dependence, cigarettes, uncomplicated: Secondary | ICD-10-CM | POA: Diagnosis not present

## 2017-06-01 DIAGNOSIS — Z452 Encounter for adjustment and management of vascular access device: Secondary | ICD-10-CM | POA: Insufficient documentation

## 2017-06-01 MED ORDER — SODIUM CHLORIDE 0.9% FLUSH
10.0000 mL | INTRAVENOUS | Status: AC | PRN
  Administered 2017-06-01: 10 mL
  Filled 2017-06-01: qty 10

## 2017-06-01 MED ORDER — HEPARIN SOD (PORK) LOCK FLUSH 100 UNIT/ML IV SOLN
500.0000 [IU] | INTRAVENOUS | Status: AC | PRN
  Administered 2017-06-01: 500 [IU]

## 2017-07-13 ENCOUNTER — Inpatient Hospital Stay (HOSPITAL_BASED_OUTPATIENT_CLINIC_OR_DEPARTMENT_OTHER): Payer: Medicare Other | Admitting: Oncology

## 2017-07-13 ENCOUNTER — Encounter: Payer: Self-pay | Admitting: Oncology

## 2017-07-13 ENCOUNTER — Telehealth: Payer: Self-pay | Admitting: *Deleted

## 2017-07-13 ENCOUNTER — Inpatient Hospital Stay: Payer: Medicare Other | Attending: Oncology

## 2017-07-13 VITALS — BP 144/72 | HR 78 | Temp 97.8°F | Resp 18 | Wt 152.0 lb

## 2017-07-13 DIAGNOSIS — Z880 Allergy status to penicillin: Secondary | ICD-10-CM

## 2017-07-13 DIAGNOSIS — J449 Chronic obstructive pulmonary disease, unspecified: Secondary | ICD-10-CM | POA: Diagnosis not present

## 2017-07-13 DIAGNOSIS — Z87891 Personal history of nicotine dependence: Secondary | ICD-10-CM | POA: Insufficient documentation

## 2017-07-13 DIAGNOSIS — M549 Dorsalgia, unspecified: Secondary | ICD-10-CM

## 2017-07-13 DIAGNOSIS — I739 Peripheral vascular disease, unspecified: Secondary | ICD-10-CM

## 2017-07-13 DIAGNOSIS — D45 Polycythemia vera: Secondary | ICD-10-CM | POA: Insufficient documentation

## 2017-07-13 DIAGNOSIS — E785 Hyperlipidemia, unspecified: Secondary | ICD-10-CM | POA: Diagnosis not present

## 2017-07-13 DIAGNOSIS — Z95828 Presence of other vascular implants and grafts: Secondary | ICD-10-CM

## 2017-07-13 DIAGNOSIS — G8929 Other chronic pain: Secondary | ICD-10-CM | POA: Insufficient documentation

## 2017-07-13 DIAGNOSIS — Z8673 Personal history of transient ischemic attack (TIA), and cerebral infarction without residual deficits: Secondary | ICD-10-CM | POA: Insufficient documentation

## 2017-07-13 DIAGNOSIS — D751 Secondary polycythemia: Secondary | ICD-10-CM

## 2017-07-13 DIAGNOSIS — D75839 Thrombocytosis, unspecified: Secondary | ICD-10-CM

## 2017-07-13 DIAGNOSIS — Z79899 Other long term (current) drug therapy: Secondary | ICD-10-CM | POA: Insufficient documentation

## 2017-07-13 DIAGNOSIS — D473 Essential (hemorrhagic) thrombocythemia: Secondary | ICD-10-CM

## 2017-07-13 DIAGNOSIS — D72810 Lymphocytopenia: Secondary | ICD-10-CM

## 2017-07-13 LAB — CBC WITH DIFFERENTIAL/PLATELET
BASOS ABS: 0 10*3/uL (ref 0–0.1)
BASOS PCT: 1 %
EOS ABS: 0.1 10*3/uL (ref 0–0.7)
EOS PCT: 2 %
HCT: 41.9 % (ref 35.0–47.0)
Hemoglobin: 14.2 g/dL (ref 12.0–16.0)
Lymphocytes Relative: 27 %
Lymphs Abs: 0.8 10*3/uL — ABNORMAL LOW (ref 1.0–3.6)
MCH: 32.5 pg (ref 26.0–34.0)
MCHC: 33.8 g/dL (ref 32.0–36.0)
MCV: 96.1 fL (ref 80.0–100.0)
MONO ABS: 0.4 10*3/uL (ref 0.2–0.9)
MONOS PCT: 11 %
Neutro Abs: 1.9 10*3/uL (ref 1.4–6.5)
Neutrophils Relative %: 59 %
PLATELETS: 343 10*3/uL (ref 150–440)
RBC: 4.36 MIL/uL (ref 3.80–5.20)
RDW: 16.3 % — AB (ref 11.5–14.5)
WBC: 3.1 10*3/uL — ABNORMAL LOW (ref 3.6–11.0)

## 2017-07-13 LAB — COMPREHENSIVE METABOLIC PANEL
ALK PHOS: 69 U/L (ref 38–126)
ALT: 10 U/L — ABNORMAL LOW (ref 14–54)
AST: 17 U/L (ref 15–41)
Albumin: 3.4 g/dL — ABNORMAL LOW (ref 3.5–5.0)
Anion gap: 7 (ref 5–15)
BILIRUBIN TOTAL: 0.6 mg/dL (ref 0.3–1.2)
BUN: 7 mg/dL (ref 6–20)
CALCIUM: 8.8 mg/dL — AB (ref 8.9–10.3)
CO2: 32 mmol/L (ref 22–32)
CREATININE: 0.96 mg/dL (ref 0.44–1.00)
Chloride: 97 mmol/L — ABNORMAL LOW (ref 101–111)
GFR calc Af Amer: 58 mL/min — ABNORMAL LOW (ref >60)
GFR calc non Af Amer: 50 mL/min — ABNORMAL LOW (ref >60)
GLUCOSE: 125 mg/dL — AB (ref 65–99)
Potassium: 3 mmol/L — ABNORMAL LOW (ref 3.5–5.1)
Sodium: 136 mmol/L (ref 135–145)
TOTAL PROTEIN: 6.1 g/dL — AB (ref 6.5–8.1)

## 2017-07-13 MED ORDER — HEPARIN SOD (PORK) LOCK FLUSH 100 UNIT/ML IV SOLN
500.0000 [IU] | INTRAVENOUS | Status: AC | PRN
  Administered 2017-07-13: 500 [IU]

## 2017-07-13 MED ORDER — SODIUM CHLORIDE 0.9% FLUSH
10.0000 mL | INTRAVENOUS | Status: AC | PRN
  Administered 2017-07-13: 10 mL
  Filled 2017-07-13: qty 10

## 2017-07-13 NOTE — Progress Notes (Signed)
Patient here for follow up with labs today. She has no new complaints. She reports having chronic back pain which is unchanged since her last visit.

## 2017-07-13 NOTE — Telephone Encounter (Signed)
Patient potassium was 3.0 today.  Dr. Janese Banks asked me to call to the primary care's office and let them know that patient's potassium was low and that she is currently on potassium dose.  Called the office and spoke to [place] she will pass the message along that the patient's potassium is low she is currently on potassium and we would like the primary care to manage her potassium level and any intervention that is required.  I also sent and in  basket message to the primary care doctor-Dr. Hoy Morn in addition to this phone call.

## 2017-07-13 NOTE — Progress Notes (Signed)
   Hematology/Oncology Consult note [facility]  [phone] [phone]  Patient Care Team: Grandis, Heidi, MD as PCP - General (Family Medicine)   Name of the patient: Krista Ortiz  [id number]  04/06/1924   Date of visit: 07/13/17  Diagnosis- JAK2 positive polycythemia vera  Chief complaint/ Reason for visit- routine f/u of polycythemia vera  Heme/Onc history: Patient is a 83-year-old female with a long-standing history of polycythemia/thrombocytosis. Patient has been on Hydrea 500 mg 3 times a week. She is also required phlebotomy in the past but none recently.JAK2 mutation testing was positive. She has not had a bone marrow biopsy  Interval history- she continues to live alone and is independent of her ADL's and IADL's. She still drives. Denies any skin rash. Has chronic mild edema in her feet.   ECOG PS- 2 Pain scale- 5  Review of systems- Review of Systems  Constitutional: Negative for chills, fever, malaise/fatigue and weight loss.  HENT: Negative for congestion, ear discharge and nosebleeds.   Eyes: Negative for blurred vision.  Respiratory: Negative for cough, hemoptysis, sputum production, shortness of breath and wheezing.   Cardiovascular: Negative for chest pain, palpitations, orthopnea and claudication.  Gastrointestinal: Negative for abdominal pain, blood in stool, constipation, diarrhea, heartburn, melena, nausea and vomiting.  Genitourinary: Negative for dysuria, flank pain, frequency, hematuria and urgency.  Musculoskeletal: Negative for back pain, joint pain and myalgias.  Skin: Negative for rash.  Neurological: Negative for dizziness, tingling, focal weakness, seizures, weakness and headaches.  Endo/Heme/Allergies: Does not bruise/bleed easily.  Psychiatric/Behavioral: Negative for depression and suicidal ideas. The patient does not have insomnia.       Allergies  Allergen Reactions  . Citalopram Other (See  Comments)    Reaction:  Fatigue   . Pneumococcal Vaccine Other (See Comments)    Doesn't remember what occurred.  . Pneumovax [Pneumococcal Polysaccharide Vaccine] Other (See Comments)    Reaction:  Unknown   . Requip [Ropinirole Hcl] Other (See Comments)    Reaction:  Dry mouth   . Penicillins Rash and Other (See Comments)    Past Medical History:  Diagnosis Date  . Acoustic neuroma ([facility])    md notes says history of acoustic neuroma or meningioma followed up by yearly MRI  . Chronic back pain   . COPD (chronic obstructive pulmonary disease) ([facility])   . Hyperlipidemia   . Knee joint replacement status, right 11/25/2016  . Polycythemia   . Polycythemia, secondary 04/26/2015  . Port-a-cath in place   . PVD (peripheral vascular disease) ([facility])   . Right wrist fracture 11/25/2016  . Thoracic compression fracture, closed, initial encounter ([facility]) 11/25/2016  . TIA (transient ischemic attack)      Past Surgical History:  Procedure Laterality Date  . ABDOMINAL HYSTERECTOMY    . APPENDECTOMY    . BACK SURGERY    . cataracts    . CHOLECYSTECTOMY    . REPLACEMENT TOTAL KNEE    . TONSILLECTOMY      Social History   Socioeconomic History  . Marital status: Widowed    Spouse name: Not on file  . Number of children: Not on file  . Years of education: Not on file  . Highest education level: Not on file  Social Needs  . Financial resource strain: Not on file  . Food insecurity - worry: Not on file  . Food insecurity - inability: Not on file  . Transportation needs - medical: Not on file  . Transportation needs -   non-medical: Not on file  Occupational History  . Not on file  Tobacco Use  . Smoking status: Former Smoker    Packs/day: 0.50    Types: Cigarettes    Last attempt to quit: 02/05/2017    Years since quitting: 0.4  . Smokeless tobacco: Never Used  Substance and Sexual Activity  . Alcohol use: No  . Drug use: No  . Sexual activity: Not Currently  Other Topics Concern   . Not on file  Social History Narrative  . Not on file    Family History  Problem Relation Age of Onset  . Other Mother        Old age  . Other Father        Old age  . CVA Sister   . Heart attack Brother      Current Outpatient Medications:  .  acetaminophen (TYLENOL) 325 MG tablet, Take 650 mg by mouth every 6 (six) hours as needed., Disp: , Rfl:  .  albuterol (PROVENTIL) (2.5 MG/3ML) 0.083% nebulizer solution, Inhale 3 mLs into the lungs every 6 (six) hours as needed., Disp: , Rfl: 0 .  atorvastatin (LIPITOR) 10 MG tablet, Take 10 mg by mouth daily., Disp: , Rfl:  .  clopidogrel (PLAVIX) 75 MG tablet, Take 75 mg by mouth daily., Disp: , Rfl:  .  furosemide (LASIX) 20 MG tablet, Take 20 mg by mouth 2 (two) times daily. , Disp: , Rfl:  .  hydroxyurea (HYDREA) 500 MG capsule, Take 500 mg by mouth 3 (three) times a week. Pt takes on Monday, Wednesday, and Friday., Disp: , Rfl:  .  potassium chloride (K-DUR) 10 MEQ tablet, Take 10 mEq by mouth daily., Disp: , Rfl:  .  predniSONE (STERAPRED UNI-PAK 21 TAB) 10 MG (21) TBPK tablet, Take as prescribed, Disp: 21 tablet, Rfl: 0 No current facility-administered medications for this visit.   Facility-Administered Medications Ordered in Other Visits:  .  sodium chloride flush (NS) 0.9 % injection 10 mL, 10 mL, Intravenous, PRN, Herring, Leslie F, NP, 10 mL at 10/03/15 0958  Physical exam:  Vitals:   07/13/17 1348  BP: (!) 144/72  Pulse: 78  Resp: 18  Temp: 97.8 F (36.6 C)  TempSrc: Tympanic  Weight: 152 lb (68.9 kg)   Physical Exam  Constitutional: She is oriented to person, place, and time and well-developed, well-nourished, and in no distress.  HENT:  Head: Normocephalic and atraumatic.  Mouth/Throat: Oropharynx is clear and moist.  Eyes: EOM are normal. Pupils are equal, round, and reactive to light.  Neck: Normal range of motion.  Cardiovascular: Normal rate, regular rhythm and normal heart sounds.  Pulmonary/Chest:  Effort normal and breath sounds normal.  Abdominal: Soft. Bowel sounds are normal.  Musculoskeletal: She exhibits edema (b/l ankle edema).  Neurological: She is alert and oriented to person, place, and time.  Skin: Skin is warm and dry.     CMP Latest Ref Rng & Units 04/20/2017  Glucose 65 - 99 mg/dL 118(H)  BUN 6 - 20 mg/dL 13  Creatinine 0.44 - 1.00 mg/dL 1.12(H)  Sodium 135 - 145 mmol/L 139  Potassium 3.5 - 5.1 mmol/L 3.6  Chloride 101 - 111 mmol/L 100(L)  CO2 22 - 32 mmol/L 29  Calcium 8.9 - 10.3 mg/dL 8.9  Total Protein 6.5 - 8.1 g/dL 6.3(L)  Total Bilirubin 0.3 - 1.2 mg/dL 0.6  Alkaline Phos 38 - 126 U/L 76  AST 15 - 41 U/L 20  ALT 14 - 54   U/L 12(L)   CBC Latest Ref Rng & Units 07/13/2017  WBC 3.6 - 11.0 K/uL 3.1(L)  Hemoglobin 12.0 - 16.0 g/dL 14.2  Hematocrit 35.0 - 47.0 % 41.9  Platelets 150 - 440 K/uL 343     Assessment and plan- Patient is a 83 y.o. female with JAK2 positive P evra  Patient is on hydrea 500 mg 3 times a week and tolerating it well. No recent thromboembolic episodes. Hematocrit <45. No need for phlebotomy at this time  Repeat cbc with diff and cmp in 3 and 6 months and I will see her in 6 months  She has mild leukopenia mainly lymphopenia which we will continue to monitor   Visit Diagnosis 1. Polycythemia vera (HCC)   2. High risk medication use      Dr.  , MD, MPH [facility] Pager- [phone] 07/13/2017 2:03 PM                 

## 2017-07-14 NOTE — Telephone Encounter (Signed)
Krista Ortiz called back from [facility] primary care and left me message to call her back [phone]. I called and got her voicemail. I left her message that we only see pt every 6 months for her polycythemia, and thrombocytosis and her k level was 3.0 and she is currently on 10 meq of kcl every day. Dr. Janese Banks wanted Dr. Hoy Morn to intervene and make appropriate inc. In potassium and manage levels. I left that she can call me back if need be if she has questions

## 2017-08-19 ENCOUNTER — Other Ambulatory Visit: Payer: Self-pay | Admitting: Family Medicine

## 2017-08-19 DIAGNOSIS — R221 Localized swelling, mass and lump, neck: Secondary | ICD-10-CM

## 2017-08-24 ENCOUNTER — Inpatient Hospital Stay: Payer: Medicare Other | Attending: Oncology

## 2017-08-24 DIAGNOSIS — D72810 Lymphocytopenia: Secondary | ICD-10-CM | POA: Insufficient documentation

## 2017-08-24 DIAGNOSIS — D45 Polycythemia vera: Secondary | ICD-10-CM | POA: Diagnosis present

## 2017-08-24 DIAGNOSIS — D473 Essential (hemorrhagic) thrombocythemia: Secondary | ICD-10-CM | POA: Diagnosis not present

## 2017-08-24 DIAGNOSIS — Z452 Encounter for adjustment and management of vascular access device: Secondary | ICD-10-CM | POA: Diagnosis not present

## 2017-08-24 DIAGNOSIS — Z95828 Presence of other vascular implants and grafts: Secondary | ICD-10-CM

## 2017-08-24 MED ORDER — SODIUM CHLORIDE 0.9% FLUSH
10.0000 mL | INTRAVENOUS | Status: AC | PRN
  Administered 2017-08-24: 10 mL
  Filled 2017-08-24: qty 10

## 2017-08-24 MED ORDER — HEPARIN SOD (PORK) LOCK FLUSH 100 UNIT/ML IV SOLN
500.0000 [IU] | INTRAVENOUS | Status: AC | PRN
  Administered 2017-08-24: 500 [IU]

## 2017-08-26 ENCOUNTER — Ambulatory Visit
Admission: RE | Admit: 2017-08-26 | Discharge: 2017-08-26 | Disposition: A | Discharge status: Home / Self Care | Payer: Medicare Other | Source: Ambulatory Visit | Attending: Family Medicine | Admitting: Family Medicine

## 2017-08-26 DIAGNOSIS — R221 Localized swelling, mass and lump, neck: Secondary | ICD-10-CM | POA: Insufficient documentation

## 2017-09-29 ENCOUNTER — Other Ambulatory Visit: Payer: Self-pay | Admitting: Unknown Physician Specialty

## 2017-09-29 DIAGNOSIS — R1310 Dysphagia, unspecified: Secondary | ICD-10-CM

## 2017-10-05 ENCOUNTER — Ambulatory Visit
Admission: RE | Admit: 2017-10-05 | Discharge: 2017-10-05 | Disposition: A | Discharge status: Home / Self Care | Payer: Medicare Other | Source: Ambulatory Visit | Attending: Unknown Physician Specialty | Admitting: Unknown Physician Specialty

## 2017-10-05 DIAGNOSIS — R1312 Dysphagia, oropharyngeal phase: Secondary | ICD-10-CM | POA: Insufficient documentation

## 2017-10-05 DIAGNOSIS — R1310 Dysphagia, unspecified: Secondary | ICD-10-CM

## 2017-10-12 ENCOUNTER — Inpatient Hospital Stay: Payer: Medicare Other | Attending: Oncology

## 2017-10-12 DIAGNOSIS — D72810 Lymphocytopenia: Secondary | ICD-10-CM | POA: Diagnosis not present

## 2017-10-12 DIAGNOSIS — D45 Polycythemia vera: Secondary | ICD-10-CM | POA: Insufficient documentation

## 2017-10-12 DIAGNOSIS — Z79899 Other long term (current) drug therapy: Secondary | ICD-10-CM

## 2017-10-12 LAB — COMPREHENSIVE METABOLIC PANEL
ALBUMIN: 3.4 g/dL — AB (ref 3.5–5.0)
ALK PHOS: 88 U/L (ref 38–126)
ALT: 11 U/L — AB (ref 14–54)
AST: 15 U/L (ref 15–41)
Anion gap: 9 (ref 5–15)
BUN: 13 mg/dL (ref 6–20)
CALCIUM: 8.7 mg/dL — AB (ref 8.9–10.3)
CO2: 31 mmol/L (ref 22–32)
CREATININE: 1.12 mg/dL — AB (ref 0.44–1.00)
Chloride: 98 mmol/L — ABNORMAL LOW (ref 101–111)
GFR calc Af Amer: 48 mL/min — ABNORMAL LOW (ref >60)
GFR calc non Af Amer: 42 mL/min — ABNORMAL LOW (ref >60)
Glucose, Bld: 99 mg/dL (ref 65–99)
Potassium: 4 mmol/L (ref 3.5–5.1)
SODIUM: 138 mmol/L (ref 135–145)
Total Bilirubin: 0.4 mg/dL (ref 0.3–1.2)
Total Protein: 6.3 g/dL — ABNORMAL LOW (ref 6.5–8.1)

## 2017-10-12 LAB — CBC WITH DIFFERENTIAL/PLATELET
BASOS ABS: 0 10*3/uL (ref 0–0.1)
BASOS PCT: 1 %
EOS ABS: 0 10*3/uL (ref 0–0.7)
EOS PCT: 1 %
HCT: 42.1 % (ref 35.0–47.0)
HEMOGLOBIN: 14.1 g/dL (ref 12.0–16.0)
LYMPHS ABS: 1 10*3/uL (ref 1.0–3.6)
Lymphocytes Relative: 26 %
MCH: 34.1 pg — ABNORMAL HIGH (ref 26.0–34.0)
MCHC: 33.5 g/dL (ref 32.0–36.0)
MCV: 101.7 fL — ABNORMAL HIGH (ref 80.0–100.0)
Monocytes Absolute: 0.3 10*3/uL (ref 0.2–0.9)
Monocytes Relative: 9 %
NEUTROS PCT: 63 %
Neutro Abs: 2.3 10*3/uL (ref 1.4–6.5)
Platelets: 482 10*3/uL — ABNORMAL HIGH (ref 150–440)
RBC: 4.14 MIL/uL (ref 3.80–5.20)
RDW: 15.8 % — ABNORMAL HIGH (ref 11.5–14.5)
WBC: 3.7 10*3/uL (ref 3.6–11.0)

## 2017-10-12 MED ORDER — HEPARIN SOD (PORK) LOCK FLUSH 100 UNIT/ML IV SOLN
500.0000 [IU] | Freq: Once | INTRAVENOUS | Status: AC
  Administered 2017-10-12: 500 [IU] via INTRAVENOUS

## 2017-10-12 MED ORDER — SODIUM CHLORIDE 0.9% FLUSH
10.0000 mL | INTRAVENOUS | Status: DC | PRN
  Administered 2017-10-12: 10 mL via INTRAVENOUS
  Filled 2017-10-12: qty 10

## 2017-11-16 ENCOUNTER — Other Ambulatory Visit: Payer: Self-pay | Admitting: Family Medicine

## 2017-11-16 DIAGNOSIS — R911 Solitary pulmonary nodule: Secondary | ICD-10-CM

## 2017-11-30 ENCOUNTER — Ambulatory Visit
Admission: RE | Admit: 2017-11-30 | Discharge: 2017-11-30 | Disposition: A | Discharge status: Home / Self Care | Payer: Medicare Other | Source: Ambulatory Visit | Attending: Family Medicine | Admitting: Family Medicine

## 2017-11-30 DIAGNOSIS — R918 Other nonspecific abnormal finding of lung field: Secondary | ICD-10-CM | POA: Insufficient documentation

## 2017-11-30 DIAGNOSIS — J432 Centrilobular emphysema: Secondary | ICD-10-CM | POA: Diagnosis not present

## 2017-11-30 DIAGNOSIS — R911 Solitary pulmonary nodule: Secondary | ICD-10-CM | POA: Diagnosis present

## 2017-11-30 DIAGNOSIS — I700 Atherosclerosis of aorta: Secondary | ICD-10-CM | POA: Diagnosis not present

## 2018-01-11 ENCOUNTER — Inpatient Hospital Stay (HOSPITAL_BASED_OUTPATIENT_CLINIC_OR_DEPARTMENT_OTHER): Payer: 59 | Admitting: Oncology

## 2018-01-11 ENCOUNTER — Encounter: Payer: Self-pay | Admitting: Oncology

## 2018-01-11 ENCOUNTER — Inpatient Hospital Stay: Payer: 59 | Attending: Oncology

## 2018-01-11 VITALS — BP 146/77 | HR 73 | Temp 97.8°F | Resp 18 | Ht 62.0 in | Wt 143.0 lb

## 2018-01-11 DIAGNOSIS — Z7902 Long term (current) use of antithrombotics/antiplatelets: Secondary | ICD-10-CM | POA: Insufficient documentation

## 2018-01-11 DIAGNOSIS — D45 Polycythemia vera: Secondary | ICD-10-CM

## 2018-01-11 DIAGNOSIS — Z79899 Other long term (current) drug therapy: Secondary | ICD-10-CM

## 2018-01-11 DIAGNOSIS — Z452 Encounter for adjustment and management of vascular access device: Secondary | ICD-10-CM | POA: Insufficient documentation

## 2018-01-11 DIAGNOSIS — F17210 Nicotine dependence, cigarettes, uncomplicated: Secondary | ICD-10-CM

## 2018-01-11 DIAGNOSIS — E785 Hyperlipidemia, unspecified: Secondary | ICD-10-CM | POA: Diagnosis not present

## 2018-01-11 DIAGNOSIS — I739 Peripheral vascular disease, unspecified: Secondary | ICD-10-CM

## 2018-01-11 DIAGNOSIS — R5381 Other malaise: Secondary | ICD-10-CM | POA: Diagnosis not present

## 2018-01-11 DIAGNOSIS — J449 Chronic obstructive pulmonary disease, unspecified: Secondary | ICD-10-CM | POA: Insufficient documentation

## 2018-01-11 DIAGNOSIS — Z950 Presence of cardiac pacemaker: Secondary | ICD-10-CM

## 2018-01-11 DIAGNOSIS — G8929 Other chronic pain: Secondary | ICD-10-CM | POA: Diagnosis not present

## 2018-01-11 DIAGNOSIS — R5383 Other fatigue: Secondary | ICD-10-CM | POA: Diagnosis not present

## 2018-01-11 DIAGNOSIS — M549 Dorsalgia, unspecified: Secondary | ICD-10-CM | POA: Diagnosis not present

## 2018-01-11 DIAGNOSIS — Z8673 Personal history of transient ischemic attack (TIA), and cerebral infarction without residual deficits: Secondary | ICD-10-CM | POA: Insufficient documentation

## 2018-01-11 LAB — CBC WITH DIFFERENTIAL/PLATELET
BASOS ABS: 0 10*3/uL (ref 0–0.1)
BASOS PCT: 1 %
Eosinophils Absolute: 0 10*3/uL (ref 0–0.7)
Eosinophils Relative: 1 %
HEMATOCRIT: 42.1 % (ref 35.0–47.0)
HEMOGLOBIN: 14.4 g/dL (ref 12.0–16.0)
Lymphocytes Relative: 26 %
Lymphs Abs: 1.1 10*3/uL (ref 1.0–3.6)
MCH: 36 pg — AB (ref 26.0–34.0)
MCHC: 34.2 g/dL (ref 32.0–36.0)
MCV: 105.3 fL — ABNORMAL HIGH (ref 80.0–100.0)
Monocytes Absolute: 0.5 10*3/uL (ref 0.2–0.9)
Monocytes Relative: 11 %
NEUTROS ABS: 2.5 10*3/uL (ref 1.4–6.5)
NEUTROS PCT: 61 %
PLATELETS: 339 10*3/uL (ref 150–440)
RBC: 4 MIL/uL (ref 3.80–5.20)
RDW: 14.7 % — AB (ref 11.5–14.5)
WBC: 4.1 10*3/uL (ref 3.6–11.0)

## 2018-01-11 LAB — COMPREHENSIVE METABOLIC PANEL
ALK PHOS: 80 U/L (ref 38–126)
ALT: 10 U/L — ABNORMAL LOW (ref 14–54)
ANION GAP: 12 (ref 5–15)
AST: 17 U/L (ref 15–41)
Albumin: 3.5 g/dL (ref 3.5–5.0)
BUN: 11 mg/dL (ref 6–20)
CALCIUM: 9.2 mg/dL (ref 8.9–10.3)
CO2: 27 mmol/L (ref 22–32)
Chloride: 97 mmol/L — ABNORMAL LOW (ref 101–111)
Creatinine, Ser: 1.13 mg/dL — ABNORMAL HIGH (ref 0.44–1.00)
GFR calc non Af Amer: 41 mL/min — ABNORMAL LOW (ref >60)
GFR, EST AFRICAN AMERICAN: 47 mL/min — AB (ref >60)
Glucose, Bld: 102 mg/dL — ABNORMAL HIGH (ref 65–99)
Potassium: 3.9 mmol/L (ref 3.5–5.1)
SODIUM: 136 mmol/L (ref 135–145)
Total Bilirubin: 0.6 mg/dL (ref 0.3–1.2)
Total Protein: 6.6 g/dL (ref 6.5–8.1)

## 2018-01-11 MED ORDER — SODIUM CHLORIDE 0.9% FLUSH
10.0000 mL | Freq: Once | INTRAVENOUS | Status: AC
  Administered 2018-01-11: 10 mL via INTRAVENOUS
  Filled 2018-01-11: qty 10

## 2018-01-11 MED ORDER — HEPARIN SOD (PORK) LOCK FLUSH 100 UNIT/ML IV SOLN
500.0000 [IU] | Freq: Once | INTRAVENOUS | Status: AC
  Administered 2018-01-11: 500 [IU] via INTRAVENOUS

## 2018-01-11 NOTE — Progress Notes (Signed)
Hematology/Oncology Consult note [facility]  [phone] [phone]  Patient Care Team: Hortencia Pilar, MD as PCP - General (Family Medicine)   Name of the patient: Krista Ortiz  [id number]  09-Jun-1924   Date of visit: 01/11/18  Diagnosis- JAK2 positive polycythemia vera   Chief complaint/ Reason for visit- routine f/u of polycythemia vera  Heme/Onc history: Patient is a 84 year old female with a long-standing history of polycythemia/thrombocytosis. Patient has been on Hydrea 500 mg 3 times a week. She is also required phlebotomy in the past but none recently.JAK2 mutation testing was positive. She has not had a bone marrow biopsy    Interval history- tolerating hydrea well. Reports no side effects. Her weight is down by 8 pounds. She is walking around the house with a walker. No falls.   ECOG PS- 2 Pain scale- 0   Review of systems- Review of Systems  Constitutional: Positive for malaise/fatigue. Negative for chills, fever and weight loss.  HENT: Negative for congestion, ear discharge and nosebleeds.   Eyes: Negative for blurred vision.  Respiratory: Negative for cough, hemoptysis, sputum production, shortness of breath and wheezing.   Cardiovascular: Negative for chest pain, palpitations, orthopnea and claudication.  Gastrointestinal: Negative for abdominal pain, blood in stool, constipation, diarrhea, heartburn, melena, nausea and vomiting.  Genitourinary: Negative for dysuria, flank pain, frequency, hematuria and urgency.  Musculoskeletal: Negative for back pain, joint pain and myalgias.  Skin: Negative for rash.  Neurological: Negative for dizziness, tingling, focal weakness, seizures, weakness and headaches.  Endo/Heme/Allergies: Does not bruise/bleed easily.  Psychiatric/Behavioral: Negative for depression and suicidal ideas. The patient does not have insomnia.      Allergies  Allergen Reactions  . Citalopram Other  (See Comments)    Reaction:  Fatigue   . Pneumococcal Vaccine Other (See Comments)    Doesn't remember what occurred.  . Pneumovax [Pneumococcal Polysaccharide Vaccine] Other (See Comments)    Reaction:  Unknown   . Requip [Ropinirole Hcl] Other (See Comments)    Reaction:  Dry mouth   . Penicillins Rash and Other (See Comments)    Has patient had a PCN reaction causing immediate rash, facial/tongue/throat swelling, SOB or lightheadedness with hypotension:  Has patient had a PCN reaction causing severe rash involving mucus membranes or skin necrosis:  Has patient had a PCN reaction that required hospitalization  Has patient had a PCN reaction occurring within the last 10 years:  If all of the above answers are "NO", then may proceed with Cephalosporin use.      Past Medical History:  Diagnosis Date  . Acoustic neuroma [facility])    md notes says history of acoustic neuroma or meningioma followed up by yearly MRI  . Chronic back pain   . COPD (chronic obstructive pulmonary disease) ([place])   . Hyperlipidemia   . Knee joint replacement status, right 11/25/2016  . Polycythemia   . Polycythemia, secondary 04/26/2015  . Port-A-Cath in place   . PVD (peripheral vascular disease) ([place])   . Right wrist fracture 11/25/2016  . Thoracic compression fracture, closed, initial encounter ([place]) 11/25/2016  . TIA (transient ischemic attack)      Past Surgical History:  Procedure Laterality Date  . ABDOMINAL HYSTERECTOMY    . APPENDECTOMY    . BACK SURGERY    . cataracts    . CHOLECYSTECTOMY    . PACEMAKER INSERTION Left 02/18/2015   Procedure: INSERTION PACEMAKER;  Surgeon: Isaias Cowman, MD;  Location: [facility] ORS;  Service:  Cardiovascular;  Laterality: Left;  . REPLACEMENT TOTAL KNEE    . TONSILLECTOMY      Social History   Socioeconomic History  . Marital status: Widowed    Spouse name: Not on file  . Number of children: Not on file  . Years of education: Not on file  . Highest  education level: Not on file  Occupational History  . Not on file  Social Needs  . Financial resource strain: Not on file  . Food insecurity:    Worry: Not on file    Inability: Not on file  . Transportation needs:    Medical: Not on file    Non-medical: Not on file  Tobacco Use  . Smoking status: Current Every Day Smoker    Packs/day: 0.50    Types: Cigarettes  . Smokeless tobacco: Never Used  Substance and Sexual Activity  . Alcohol use: No  . Drug use: No  . Sexual activity: Not Currently  Lifestyle  . Physical activity:    Days per week: Not on file    Minutes per session: Not on file  . Stress: Not on file  Relationships  . Social connections:    Talks on phone: Not on file    Gets together: Not on file    Attends religious service: Not on file    Active member of club or organization: Not on file    Attends meetings of clubs or organizations: Not on file    Relationship status: Not on file  . Intimate partner violence:    Fear of current or ex partner: Not on file    Emotionally abused: Not on file    Physically abused: Not on file    Forced sexual activity: Not on file  Other Topics Concern  . Not on file  Social History Narrative  . Not on file    Family History  Problem Relation Age of Onset  . Other Mother        Old age  . Other Father        Old age  . CVA Sister   . Heart attack Brother      Current Outpatient Medications:  .  albuterol (PROVENTIL) (2.5 MG/3ML) 0.083% nebulizer solution, Inhale 3 mLs into the lungs every 6 (six) hours as needed., Disp: , Rfl: 0 .  atorvastatin (LIPITOR) 10 MG tablet, Take 10 mg by mouth daily., Disp: , Rfl:  .  budesonide-formoterol (SYMBICORT) 160-4.5 MCG/ACT inhaler, Inhale 2 Inhalers 2 (two) times daily into the lungs., Disp: , Rfl:  .  clopidogrel (PLAVIX) 75 MG tablet, Take 75 mg by mouth daily., Disp: , Rfl:  .  furosemide (LASIX) 20 MG tablet, Take 20 mg by mouth 2 (two) times daily. , Disp: , Rfl:  .   hydroxyurea (HYDREA) 500 MG capsule, Take 500 mg by mouth 3 (three) times a week. Pt takes on Monday, Wednesday, and Friday., Disp: , Rfl:  .  ipratropium-albuterol (DUONEB) 0.5-2.5 (3) MG/3ML SOLN, Inhale 3 mLs 4 (four) times daily as needed into the lungs., Disp: , Rfl:  .  potassium chloride (K-DUR) 10 MEQ tablet, Take 10 mEq by mouth daily., Disp: , Rfl:  .  acetaminophen (TYLENOL) 325 MG tablet, Take 650 mg by mouth every 6 (six) hours as needed., Disp: , Rfl:  No current facility-administered medications for this visit.   Facility-Administered Medications Ordered in Other Visits:  .  sodium chloride flush (NS) 0.9 % injection 10 mL, 10 mL, Intravenous,  PRN, Evlyn Kanner, NP, 10 mL at 10/03/15 5638  Physical exam:  Vitals:   01/11/18 1330  BP: (!) 146/77  Pulse: 73  Resp: 18  Temp: 97.8 F (36.6 C)  TempSrc: Oral  SpO2: 95%  Weight: 143 lb (64.9 kg)  Height: _0  (1.575 m)   Physical Exam  Constitutional: She is oriented to person, place, and time.  Frail elderly woman in no acute distress  HENT:  Head: Normocephalic and atraumatic.  Eyes: Pupils are equal, round, and reactive to light. EOM are normal.  Neck: Normal range of motion.  Cardiovascular: Normal rate, regular rhythm and normal heart sounds.  Pulmonary/Chest: Effort normal and breath sounds normal.  Abdominal: Soft. Bowel sounds are normal.  Musculoskeletal: She exhibits edema.  Neurological: She is alert and oriented to person, place, and time.  Skin: Skin is warm and dry.     CMP Latest Ref Rng & Units 01/11/2018  Glucose 65 - 99 mg/dL 102(H)  BUN 6 - 20 mg/dL 11  Creatinine 0.44 - 1.00 mg/dL 1.13(H)  Sodium 135 - 145 mmol/L 136  Potassium 3.5 - 5.1 mmol/L 3.9  Chloride 101 - 111 mmol/L 97(L)  CO2 22 - 32 mmol/L 27  Calcium 8.9 - 10.3 mg/dL 9.2  Total Protein 6.5 - 8.1 g/dL 6.6  Total Bilirubin 0.3 - 1.2 mg/dL 0.6  Alkaline Phos 38 - 126 U/L 80  AST 15 - 41 U/L 17  ALT 14 - 54 U/L 10(L)   CBC  Latest Ref Rng & Units 01/11/2018  WBC 3.6 - 11.0 K/uL 4.1  Hemoglobin 12.0 - 16.0 g/dL 14.4  Hematocrit 35.0 - 47.0 % 42.1  Platelets 150 - 440 K/uL 339      Assessment and plan- Patient is a 84 y.o. female with Jak 2+ P vera  Her hematocrit is 42.1 today with a normal white count and platelet count.  She will continue Hydrea at 500 mg 3 times a week.  I will see her back in 6 months with a CBC and CMP   Visit Diagnosis 1. Polycythemia vera ([place])   2. High risk medication use      Dr. Randa Evens, MD, MPH [facility] [phone] 01/11/2018 5:10 PM

## 2018-05-23 ENCOUNTER — Encounter (INDEPENDENT_AMBULATORY_CARE_PROVIDER_SITE_OTHER): Payer: Self-pay | Admitting: Vascular Surgery

## 2018-05-23 ENCOUNTER — Ambulatory Visit (INDEPENDENT_AMBULATORY_CARE_PROVIDER_SITE_OTHER): Payer: 59 | Admitting: Nurse Practitioner

## 2018-05-23 VITALS — BP 130/70 | HR 83 | Resp 16 | Ht 62.0 in | Wt 137.0 lb

## 2018-05-23 DIAGNOSIS — F17200 Nicotine dependence, unspecified, uncomplicated: Secondary | ICD-10-CM

## 2018-05-23 DIAGNOSIS — I739 Peripheral vascular disease, unspecified: Secondary | ICD-10-CM | POA: Diagnosis not present

## 2018-05-23 DIAGNOSIS — I6523 Occlusion and stenosis of bilateral carotid arteries: Secondary | ICD-10-CM | POA: Diagnosis not present

## 2018-05-26 NOTE — Progress Notes (Signed)
Subjective:    Patient ID: Krista Ortiz, female    DOB: 07-26-1924, 84 y.o.   MRN: [id number] Chief Complaint  Patient presents with  . New Patient (Initial Visit)    ref for carotid stenosis    HPI  Krista Ortiz is a 84 y.o. female is referred by Dr. Nehemiah Massed for evaluation of a syncopal episode. The patient describes it as a light headedness and denies the "room spinning".  It lasted on the order of minutes and resolved completely.  There was no loss of consciousness.  There have been two or three prior episodes over the past several years.  There is no recent history of TIA symptoms or focal motor deficits. There is no prior documented CVA.  The patient was not taking enteric-coated aspirin 81 mg daily at the time.  There is no history of migraine headaches or prior diagnosis of ocular migraine. There is no history of seizures.  The patient has a history of coronary artery disease, no recent episodes of angina or shortness of breath. The patient denies PAD or claudication symptoms. There is a history of hyperlipidemia which is being treated with a statin.   Ms. Glanz underwent a bilateral carotid duplex study on 04/21/2018.  The right carotid had 50 to 69% stenosis.  The left carotid had 70 to 99% stenosis.   Review of Systems: Negative Unless Checked Constitutional: [] Weight loss  [] Fever  [] Chills Cardiac: [] Chest pain   [] Chest pressure   [] Palpitations   [] Shortness of breath when laying flat   [] Shortness of breath with exertion. Vascular:  [] Pain in legs with walking   [] Pain in legs with standing  [] History of DVT   [] Phlebitis   [x] Swelling in legs   [] Varicose veins   [] Non-healing ulcers Pulmonary:   [] Uses home oxygen   [] Productive cough   [] Hemoptysis   [] Wheeze  [x] COPD   [] Asthma Neurologic:  [] Dizziness   [] Seizures   [] History of stroke   [] History of TIA  [] Aphasia   [] Visual changes   [] Weakness or numbness in arm   [x] Weakness or numbness in  leg Musculoskeletal:   [] Joint swelling   [] Joint pain   [] Low back pain Hematologic:  [] Easy bruising  [] Easy bleeding   [] Hypercoagulable state   [] Anemic Gastrointestinal:  [] Diarrhea   [] Vomiting  [] Gastroesophageal reflux/heartburn   [] Difficulty swallowing. Genitourinary:  [] Chronic kidney disease   [] Difficult urination  [] Frequent urination   [] Blood in urine Skin:  [] Rashes   [] Ulcers  Psychological:  [] History of anxiety   []  History of major depression.     Objective:   Physical Exam  BP 130/70 (BP Location: Right Arm)   Pulse 83   Resp 16   Ht 5\' 2"  (1.575 m)   Wt 137 lb (62.1 kg)   LMP  (LMP Unknown)   BMI 25.06 kg/m   Past Medical History:  Diagnosis Date  . Acoustic neuroma [facility])    md notes says history of acoustic neuroma or meningioma followed up by yearly MRI  . Chronic back pain   . COPD (chronic obstructive pulmonary disease) ([place])   . Hyperlipidemia   . Knee joint replacement status, right 11/25/2016  . Polycythemia   . Polycythemia, secondary 04/26/2015  . Port-A-Cath in place   . PVD (peripheral vascular disease) ([place])   . Right wrist fracture 11/25/2016  . Thoracic compression fracture, closed, initial encounter ([place]) 11/25/2016  . TIA (transient ischemic attack)      Gen: WD/WN, NAD  Head: [place]/AT, No temporalis wasting.  Ear/Nose/Throat: Hearing grossly intact, nares w/o erythema or drainage Eyes: PER, EOMI, sclera nonicteric.  Neck: Supple, no masses.  No JVD.  Pulmonary:  Good air movement, no use of accessory muscles.  Cardiac: RRR Vascular:  Vessel Right Left  Radial  2+  2+  Gastrointestinal: soft, non-distended. No guarding/no peritoneal signs.  Musculoskeletal: M/S 5/5 throughout.  No deformity or atrophy.  Neurologic: Pain and light touch intact in extremities.  Symmetrical.  Speech is fluent. Motor exam as listed above. Psychiatric: Judgment intact, Mood & affect appropriate for pt's clinical situation. Dermatologic: No Venous  rashes. No Ulcers Noted.  No changes consistent with cellulitis. Lymph : No Cervical lymphadenopathy, no lichenification or skin changes of chronic lymphedema.   Social History   Socioeconomic History  . Marital status: Widowed    Spouse name: Not on file  . Number of children: Not on file  . Years of education: Not on file  . Highest education level: Not on file  Occupational History  . Not on file  Social Needs  . Financial resource strain: Not on file  . Food insecurity:    Worry: Not on file    Inability: Not on file  . Transportation needs:    Medical: Not on file    Non-medical: Not on file  Tobacco Use  . Smoking status: Current Every Day Smoker    Packs/day: 0.50    Types: Cigarettes  . Smokeless tobacco: Never Used  Substance and Sexual Activity  . Alcohol use: No  . Drug use: No  . Sexual activity: Not Currently  Lifestyle  . Physical activity:    Days per week: Not on file    Minutes per session: Not on file  . Stress: Not on file  Relationships  . Social connections:    Talks on phone: Not on file    Gets together: Not on file    Attends religious service: Not on file    Active member of club or organization: Not on file    Attends meetings of clubs or organizations: Not on file    Relationship status: Not on file  . Intimate partner violence:    Fear of current or ex partner: Not on file    Emotionally abused: Not on file    Physically abused: Not on file    Forced sexual activity: Not on file  Other Topics Concern  . Not on file  Social History Narrative  . Not on file    Past Surgical History:  Procedure Laterality Date  . ABDOMINAL HYSTERECTOMY    . APPENDECTOMY    . BACK SURGERY    . cataracts    . CHOLECYSTECTOMY    . PACEMAKER INSERTION Left 02/18/2015   Procedure: INSERTION PACEMAKER;  Surgeon: Isaias Cowman, MD;  Location: [facility] ORS;  Service: Cardiovascular;  Laterality: Left;  . REPLACEMENT TOTAL KNEE    . TONSILLECTOMY       Family History  Problem Relation Age of Onset  . Other Mother        Old age  . Other Father        Old age  . CVA Sister   . Heart attack Brother          Assessment & Plan:   1. Bilateral carotid artery stenosis Ms. Greener underwent a bilateral carotid duplex study on 04/21/2018.  The right carotid had 50 to 69% stenosis.  The left carotid had 70 to 99%  stenosis.  The patient remains asymptomatic with respect to the carotid stenosis.  However, the patient has now progressed and has a lesion the is >70%.  Patient should undergo CT angiography of the carotid arteries to define the degree of stenosis of the internal carotid arteries bilaterally and the anatomic suitability for surgery vs. intervention.  In the event that surgery or intervention is needed, that the patient will need cardiac clearance.  The patient visited Dr. Nehemiah Massed on 04/21/2018, where he stated that she is currently at the lowest possible risk for cardiovascular complications with surgical intervention and/or invasive procedures.  The risks, benefits and alternative therapies were reviewed in detail with the patient.  All questions were answered.  The patient agrees to proceed with imaging.  Continue antiplatelet therapy as prescribed. Continue management of CAD, HTN and Hyperlipidemia. Healthy heart diet, encouraged exercise at least 4 times per week.     - CT ANGIO NECK W OR WO CONTRAST; Future  2. Peripheral vascular disease ([place])  Recommend:  I do not find evidence of life style limiting vascular disease. The patient specifically denies life style limitation.  The patient should continue walking and begin a more formal exercise program. The patient should continue his antiplatelet therapy and aggressive treatment of the lipid abnormalities.  The patient should begin wearing graduated compression socks 15-20 mmHg strength to control her mild edema.  3. Current smoker Discussed smoking cessation  with patient.  Explained to patient that smoking increases the atherosclerotic disease process, as well as increases the likelihood that interventions may need to be repeated.  The patient understood.  Current Outpatient Medications on File Prior to Visit  Medication Sig Dispense Refill  . acetaminophen (TYLENOL) 325 MG tablet Take 650 mg by mouth every 6 (six) hours as needed.    Marland Kitchen albuterol (PROVENTIL) (2.5 MG/3ML) 0.083% nebulizer solution Inhale 3 mLs into the lungs every 6 (six) hours as needed.  0  . atorvastatin (LIPITOR) 10 MG tablet Take 10 mg by mouth daily.    . clopidogrel (PLAVIX) 75 MG tablet Take 75 mg by mouth daily.    . furosemide (LASIX) 20 MG tablet Take 20 mg by mouth 2 (two) times daily.     . hydroxyurea (HYDREA) 500 MG capsule Take 500 mg by mouth 3 (three) times a week. Pt takes on Monday, Wednesday, and Friday.    . potassium chloride (K-DUR) 10 MEQ tablet Take 10 mEq by mouth daily.    . budesonide-formoterol (SYMBICORT) 160-4.5 MCG/ACT inhaler Inhale 2 Inhalers 2 (two) times daily into the lungs.     Current Facility-Administered Medications on File Prior to Visit  Medication Dose Route Frequency Provider Last Rate Last Dose  . sodium chloride flush (NS) 0.9 % injection 10 mL  10 mL Intravenous PRN Evlyn Kanner, NP   10 mL at 10/03/15 0958    There are no Patient Instructions on file for this visit. No follow-ups on file.   Kris Hartmann, NP

## 2018-05-27 ENCOUNTER — Encounter (INDEPENDENT_AMBULATORY_CARE_PROVIDER_SITE_OTHER): Payer: Self-pay | Admitting: Nurse Practitioner

## 2018-06-10 ENCOUNTER — Ambulatory Visit
Admission: RE | Admit: 2018-06-10 | Discharge: 2018-06-10 | Disposition: A | Discharge status: Home / Self Care | Payer: 59 | Source: Ambulatory Visit | Attending: Nurse Practitioner | Admitting: Nurse Practitioner

## 2018-06-10 DIAGNOSIS — I6523 Occlusion and stenosis of bilateral carotid arteries: Secondary | ICD-10-CM | POA: Diagnosis not present

## 2018-06-10 LAB — POCT I-STAT CREATININE
CREATININE: 4.6 mg/dL — AB (ref 0.44–1.00)
CREATININE: 4.9 mg/dL — AB (ref 0.44–1.00)
Creatinine, Ser: 4.5 mg/dL — ABNORMAL HIGH (ref 0.44–1.00)

## 2018-06-10 MED ORDER — IOHEXOL 350 MG/ML SOLN
75.0000 mL | Freq: Once | INTRAVENOUS | Status: AC | PRN
  Administered 2018-06-10: 75 mL via INTRAVENOUS

## 2018-06-16 ENCOUNTER — Other Ambulatory Visit: Payer: Self-pay | Admitting: Nephrology

## 2018-06-16 DIAGNOSIS — N179 Acute kidney failure, unspecified: Secondary | ICD-10-CM

## 2018-06-23 ENCOUNTER — Ambulatory Visit: Payer: 59

## 2018-06-23 ENCOUNTER — Ambulatory Visit
Admission: RE | Admit: 2018-06-23 | Discharge: 2018-06-23 | Disposition: A | Discharge status: Home / Self Care | Payer: 59 | Source: Ambulatory Visit | Attending: Nephrology | Admitting: Nephrology

## 2018-06-23 DIAGNOSIS — N179 Acute kidney failure, unspecified: Secondary | ICD-10-CM | POA: Insufficient documentation

## 2018-07-14 ENCOUNTER — Ambulatory Visit: Payer: 59 | Admitting: Oncology

## 2018-07-14 ENCOUNTER — Encounter: Payer: Self-pay | Admitting: Oncology

## 2018-07-14 ENCOUNTER — Inpatient Hospital Stay: Payer: 59 | Attending: Oncology

## 2018-07-14 ENCOUNTER — Other Ambulatory Visit: Payer: 59

## 2018-07-14 ENCOUNTER — Inpatient Hospital Stay (HOSPITAL_BASED_OUTPATIENT_CLINIC_OR_DEPARTMENT_OTHER): Payer: 59 | Admitting: Oncology

## 2018-07-14 VITALS — BP 102/64 | HR 73 | Temp 97.7°F | Ht 62.0 in | Wt 138.3 lb

## 2018-07-14 DIAGNOSIS — Z90710 Acquired absence of both cervix and uterus: Secondary | ICD-10-CM

## 2018-07-14 DIAGNOSIS — Z95828 Presence of other vascular implants and grafts: Secondary | ICD-10-CM

## 2018-07-14 DIAGNOSIS — F17210 Nicotine dependence, cigarettes, uncomplicated: Secondary | ICD-10-CM | POA: Insufficient documentation

## 2018-07-14 DIAGNOSIS — Z79899 Other long term (current) drug therapy: Secondary | ICD-10-CM | POA: Insufficient documentation

## 2018-07-14 DIAGNOSIS — D45 Polycythemia vera: Secondary | ICD-10-CM | POA: Diagnosis present

## 2018-07-14 LAB — CBC WITH DIFFERENTIAL/PLATELET
Abs Immature Granulocytes: 0.02 10*3/uL (ref 0.00–0.07)
Basophils Absolute: 0 10*3/uL (ref 0.0–0.1)
Basophils Relative: 1 %
Eosinophils Absolute: 0 10*3/uL (ref 0.0–0.5)
Eosinophils Relative: 1 %
HEMATOCRIT: 39.6 % (ref 36.0–46.0)
HEMOGLOBIN: 13.3 g/dL (ref 12.0–15.0)
Immature Granulocytes: 1 %
LYMPHS ABS: 0.9 10*3/uL (ref 0.7–4.0)
LYMPHS PCT: 29 %
MCH: 35.7 pg — AB (ref 26.0–34.0)
MCHC: 33.6 g/dL (ref 30.0–36.0)
MCV: 106.2 fL — ABNORMAL HIGH (ref 80.0–100.0)
MONO ABS: 0.4 10*3/uL (ref 0.1–1.0)
Monocytes Relative: 12 %
Neutro Abs: 1.7 10*3/uL (ref 1.7–7.7)
Neutrophils Relative %: 56 %
Platelets: 285 10*3/uL (ref 150–400)
RBC: 3.73 MIL/uL — ABNORMAL LOW (ref 3.87–5.11)
RDW: 14.4 % (ref 11.5–15.5)
WBC: 3 10*3/uL — ABNORMAL LOW (ref 4.0–10.5)
nRBC: 0 % (ref 0.0–0.2)

## 2018-07-14 LAB — COMPREHENSIVE METABOLIC PANEL
ALK PHOS: 69 U/L (ref 38–126)
ALT: 9 U/L (ref 0–44)
AST: 14 U/L — AB (ref 15–41)
Albumin: 3.3 g/dL — ABNORMAL LOW (ref 3.5–5.0)
Anion gap: 5 (ref 5–15)
BUN: 14 mg/dL (ref 8–23)
CO2: 28 mmol/L (ref 22–32)
CREATININE: 1 mg/dL (ref 0.44–1.00)
Calcium: 8.6 mg/dL — ABNORMAL LOW (ref 8.9–10.3)
Chloride: 104 mmol/L (ref 98–111)
GFR calc Af Amer: 55 mL/min — ABNORMAL LOW (ref >60)
GFR, EST NON AFRICAN AMERICAN: 47 mL/min — AB (ref >60)
Glucose, Bld: 120 mg/dL — ABNORMAL HIGH (ref 70–99)
Potassium: 3.9 mmol/L (ref 3.5–5.1)
Sodium: 137 mmol/L (ref 135–145)
Total Bilirubin: 0.5 mg/dL (ref 0.3–1.2)
Total Protein: 6 g/dL — ABNORMAL LOW (ref 6.5–8.1)

## 2018-07-14 MED ORDER — SODIUM CHLORIDE 0.9% FLUSH
10.0000 mL | Freq: Once | INTRAVENOUS | Status: AC
  Administered 2018-07-14: 10 mL via INTRAVENOUS
  Filled 2018-07-14: qty 10

## 2018-07-14 MED ORDER — HEPARIN SOD (PORK) LOCK FLUSH 100 UNIT/ML IV SOLN
500.0000 [IU] | Freq: Once | INTRAVENOUS | Status: AC
  Administered 2018-07-14: 500 [IU] via INTRAVENOUS

## 2018-07-15 NOTE — Progress Notes (Signed)
Hematology/Oncology Consult note [facility]  Telephone:([phone] [phone]  Patient Care Team: Hortencia Pilar, MD as PCP - General (Family Medicine)   Name of the patient: Krista Ortiz  [id number]  [birth year]   Date of visit: 07/15/18  Diagnosis-Jak 2+ polycythemia vera  Chief complaint/ Reason for visit-routine follow-up of polycythemia vera  Heme/Onc history: Patient is a 84 year old female with a long-standing history of polycythemia/thrombocytosis.Patient has been on Hydrea 500 mg 3 times a week. She is also required phlebotomy in the past but none recently.JAK2 mutation testing was positive. She has not had a bone marrow biopsy   Interval history-she is doing well for her age.  She lives alone and continues to drive and independent of her ADLs.  She is taking Hydrea 500 mg 3 times a week which she is tolerating well without any significant side effects  ECOG PS- 2 Pain scale- 0 Opioid associated constipation- no  Review of systems- Review of Systems  Constitutional: Positive for malaise/fatigue. Negative for chills, fever and weight loss.  HENT: Negative for congestion, ear discharge and nosebleeds.   Eyes: Negative for blurred vision.  Respiratory: Negative for cough, hemoptysis, sputum production, shortness of breath and wheezing.   Cardiovascular: Negative for chest pain, palpitations, orthopnea and claudication.  Gastrointestinal: Negative for abdominal pain, blood in stool, constipation, diarrhea, heartburn, melena, nausea and vomiting.  Genitourinary: Negative for dysuria, flank pain, frequency, hematuria and urgency.  Musculoskeletal: Negative for back pain, joint pain and myalgias.  Skin: Negative for rash.  Neurological: Negative for dizziness, tingling, focal weakness, seizures, weakness and headaches.  Endo/Heme/Allergies: Does not bruise/bleed easily.  Psychiatric/Behavioral: Negative for depression and suicidal  ideas. The patient does not have insomnia.     Allergies  Allergen Reactions  . Citalopram Other (See Comments)    Reaction:  Fatigue   . Pneumococcal Vaccine Other (See Comments)    Doesn't remember what occurred.  . Pneumovax [Pneumococcal Polysaccharide Vaccine] Other (See Comments)    Reaction:  Unknown   . Requip [Ropinirole Hcl] Other (See Comments)    Reaction:  Dry mouth   . Penicillins Rash and Other (See Comments)    Has patient had a PCN reaction causing immediate rash, facial/tongue/throat swelling, SOB or lightheadedness with hypotension:  Has patient had a PCN reaction causing severe rash involving mucus membranes or skin necrosis:  Has patient had a PCN reaction that required hospitalization  Has patient had a PCN reaction occurring within the last 10 years:  If all of the above answers are "NO", then may proceed with Cephalosporin use.      Past Medical History:  Diagnosis Date  . Acoustic neuroma [facility])    md notes says history of acoustic neuroma or meningioma followed up by yearly MRI  . Chronic back pain   . COPD (chronic obstructive pulmonary disease) ([place])   . Hyperlipidemia   . Knee joint replacement status, right 11/25/2016  . Polycythemia   . Polycythemia, secondary 04/26/2015  . Port-A-Cath in place   . PVD (peripheral vascular disease) ([place])   . Right wrist fracture 11/25/2016  . Thoracic compression fracture, closed, initial encounter ([place]) 11/25/2016  . TIA (transient ischemic attack)      Past Surgical History:  Procedure Laterality Date  . ABDOMINAL HYSTERECTOMY    . APPENDECTOMY    . BACK SURGERY    . cataracts    . CHOLECYSTECTOMY    . PACEMAKER INSERTION Left 02/18/2015   Procedure: INSERTION PACEMAKER;  Surgeon: Isaias Cowman, MD;  Location: [facility] ORS;  Service: Cardiovascular;  Laterality: Left;  . REPLACEMENT TOTAL KNEE    . TONSILLECTOMY      Social History   Socioeconomic History  . Marital status: Widowed    Spouse  name: Not on file  . Number of children: Not on file  . Years of education: Not on file  . Highest education level: Not on file  Occupational History  . Not on file  Social Needs  . Financial resource strain: Not on file  . Food insecurity:    Worry: Not on file    Inability: Not on file  . Transportation needs:    Medical: Not on file    Non-medical: Not on file  Tobacco Use  . Smoking status: Current Every Day Smoker    Packs/day: 0.50    Types: Cigarettes  . Smokeless tobacco: Never Used  Substance and Sexual Activity  . Alcohol use: No  . Drug use: No  . Sexual activity: Not Currently  Lifestyle  . Physical activity:    Days per week: Not on file    Minutes per session: Not on file  . Stress: Not on file  Relationships  . Social connections:    Talks on phone: Not on file    Gets together: Not on file    Attends religious service: Not on file    Active member of club or organization: Not on file    Attends meetings of clubs or organizations: Not on file    Relationship status: Not on file  . Intimate partner violence:    Fear of current or ex partner: Not on file    Emotionally abused: Not on file    Physically abused: Not on file    Forced sexual activity: Not on file  Other Topics Concern  . Not on file  Social History Narrative  . Not on file    Family History  Problem Relation Age of Onset  . Other Mother        Old age  . Other Father        Old age  . CVA Sister   . Heart attack Brother      Current Outpatient Medications:  .  atorvastatin (LIPITOR) 10 MG tablet, Take 10 mg by mouth daily., Disp: , Rfl:  .  budesonide-formoterol (SYMBICORT) 160-4.5 MCG/ACT inhaler, Inhale 2 Inhalers 2 (two) times daily into the lungs., Disp: , Rfl:  .  clopidogrel (PLAVIX) 75 MG tablet, Take 75 mg by mouth daily., Disp: , Rfl:  .  furosemide (LASIX) 20 MG tablet, Take 20 mg by mouth 2 (two) times daily. , Disp: , Rfl:  .  hydroxyurea (HYDREA) 500 MG capsule,  Take 500 mg by mouth 3 (three) times a week. Pt takes on Monday, Wednesday, and Friday., Disp: , Rfl:  .  potassium chloride (K-DUR) 10 MEQ tablet, Take 10 mEq by mouth daily., Disp: , Rfl:  .  acetaminophen (TYLENOL) 325 MG tablet, Take 650 mg by mouth every 6 (six) hours as needed., Disp: , Rfl:  .  albuterol (PROVENTIL) (2.5 MG/3ML) 0.083% nebulizer solution, Inhale 3 mLs into the lungs every 6 (six) hours as needed., Disp: , Rfl: 0 No current facility-administered medications for this visit.   Facility-Administered Medications Ordered in Other Visits:  .  sodium chloride flush (NS) 0.9 % injection 10 mL, 10 mL, Intravenous, PRN, Evlyn Kanner, NP, 10 mL at 10/03/15 8127  Physical exam:  Vitals:   07/14/18 1125  BP: 102/64  Pulse: 73  Temp: 97.7 F (36.5 C)  TempSrc: Tympanic  SpO2: 100%  Weight: 138 lb 4.8 oz (62.7 kg)  Height: '5\' 2"'  (1.575 m)   Physical Exam  Constitutional: She is oriented to person, place, and time.  Elderly frail lady in no acute distress  HENT:  Head: Normocephalic and atraumatic.  Eyes: Pupils are equal, round, and reactive to light. EOM are normal.  Neck: Normal range of motion.  Cardiovascular: Normal rate, regular rhythm and normal heart sounds.  Pulmonary/Chest: Effort normal and breath sounds normal.  Abdominal: Soft. Bowel sounds are normal.  Neurological: She is alert and oriented to person, place, and time.  Skin: Skin is warm and dry.     CMP Latest Ref Rng & Units 07/14/2018  Glucose 70 - 99 mg/dL 120(H)  BUN 8 - 23 mg/dL 14  Creatinine 0.44 - 1.00 mg/dL 1.00  Sodium 135 - 145 mmol/L 137  Potassium 3.5 - 5.1 mmol/L 3.9  Chloride 98 - 111 mmol/L 104  CO2 22 - 32 mmol/L 28  Calcium 8.9 - 10.3 mg/dL 8.6(L)  Total Protein 6.5 - 8.1 g/dL 6.0(L)  Total Bilirubin 0.3 - 1.2 mg/dL 0.5  Alkaline Phos 38 - 126 U/L 69  AST 15 - 41 U/L 14(L)  ALT 0 - 44 U/L 9   CBC Latest Ref Rng & Units 07/14/2018  WBC 4.0 - 10.5 K/uL 3.0(L)  Hemoglobin  12.0 - 15.0 g/dL 13.3  Hematocrit 36.0 - 46.0 % 39.6  Platelets 150 - 400 K/uL 285    No images are attached to the encounter.  US Renal  Result Date: 06/23/2018 CLINICAL DATA:  84 year old female with acute renal failure. Initial encounter. EXAM: RENAL / URINARY TRACT ULTRASOUND COMPLETE COMPARISON:  12/28/2005 abdominal sonogram. FINDINGS: Right Kidney: Length: 8.1 cm. Increased echogenicity. Renal parenchymal thinning. No hydronephrosis or mass noted. Left Kidney: Length: 8.7 cm. Increased echogenicity. Renal parenchymal thinning. No hydronephrosis or mass noted. Bladder: Appears normal for degree of bladder distention. IMPRESSION: 1. Bilateral increased renal parenchyma echogenicity and renal parenchymal thickening consistent with changes of medical renal disease. 2. No hydronephrosis. Electronically Signed   By: Genia Del M.D.   On: 06/23/2018 19:31     Assessment and plan- Patient is a 84 y.o. female with high risk Jak 2+ polycythemia vera  Patient's hematocrit is 39.6 today at the goal.  She therefore does not require any phlebotomy today.  She will continue Hydrea 3 times a week.  I will see her back in 6 months with a CBC and CMP   Visit Diagnosis 1. Polycythemia vera ([place])      Dr. Randa Evens, MD, MPH [facility] at [facility] [phone] 07/15/2018 1:15 PM

## 2018-07-18 ENCOUNTER — Encounter (INDEPENDENT_AMBULATORY_CARE_PROVIDER_SITE_OTHER): Payer: Self-pay

## 2018-09-13 ENCOUNTER — Inpatient Hospital Stay: Payer: Medicare Other | Attending: Oncology

## 2018-09-13 DIAGNOSIS — F17210 Nicotine dependence, cigarettes, uncomplicated: Secondary | ICD-10-CM | POA: Diagnosis not present

## 2018-09-13 DIAGNOSIS — Z79899 Other long term (current) drug therapy: Secondary | ICD-10-CM | POA: Insufficient documentation

## 2018-09-13 DIAGNOSIS — D45 Polycythemia vera: Secondary | ICD-10-CM | POA: Insufficient documentation

## 2018-09-13 DIAGNOSIS — Z95828 Presence of other vascular implants and grafts: Secondary | ICD-10-CM

## 2018-09-13 DIAGNOSIS — Z90710 Acquired absence of both cervix and uterus: Secondary | ICD-10-CM | POA: Diagnosis not present

## 2018-09-13 MED ORDER — HEPARIN SOD (PORK) LOCK FLUSH 100 UNIT/ML IV SOLN
INTRAVENOUS | Status: AC
  Filled 2018-09-13: qty 5

## 2018-09-13 MED ORDER — HEPARIN SOD (PORK) LOCK FLUSH 100 UNIT/ML IV SOLN
500.0000 [IU] | Freq: Once | INTRAVENOUS | Status: AC
  Administered 2018-09-13: 500 [IU] via INTRAVENOUS

## 2018-09-13 MED ORDER — SODIUM CHLORIDE 0.9% FLUSH
10.0000 mL | INTRAVENOUS | Status: DC | PRN
  Administered 2018-09-13: 10 mL via INTRAVENOUS
  Filled 2018-09-13: qty 10

## 2018-11-09 ENCOUNTER — Other Ambulatory Visit: Payer: Self-pay

## 2018-11-09 ENCOUNTER — Encounter: Payer: Self-pay | Admitting: *Deleted

## 2018-11-09 ENCOUNTER — Emergency Department: Payer: Medicare Other

## 2018-11-09 ENCOUNTER — Inpatient Hospital Stay
Admission: EM | Admit: 2018-11-09 | Discharge: 2018-11-16 | DRG: 482 | Disposition: A | Discharge status: Skilled Nursing Facility | Payer: Medicare Other | Attending: Internal Medicine | Admitting: Internal Medicine

## 2018-11-09 DIAGNOSIS — Z9981 Dependence on supplemental oxygen: Secondary | ICD-10-CM | POA: Diagnosis not present

## 2018-11-09 DIAGNOSIS — E785 Hyperlipidemia, unspecified: Secondary | ICD-10-CM | POA: Diagnosis present

## 2018-11-09 DIAGNOSIS — Z7951 Long term (current) use of inhaled steroids: Secondary | ICD-10-CM | POA: Diagnosis not present

## 2018-11-09 DIAGNOSIS — S72142A Displaced intertrochanteric fracture of left femur, initial encounter for closed fracture: Secondary | ICD-10-CM | POA: Diagnosis present

## 2018-11-09 DIAGNOSIS — D329 Benign neoplasm of meninges, unspecified: Secondary | ICD-10-CM | POA: Diagnosis present

## 2018-11-09 DIAGNOSIS — Z79899 Other long term (current) drug therapy: Secondary | ICD-10-CM

## 2018-11-09 DIAGNOSIS — Z90710 Acquired absence of both cervix and uterus: Secondary | ICD-10-CM | POA: Diagnosis not present

## 2018-11-09 DIAGNOSIS — Z66 Do not resuscitate: Secondary | ICD-10-CM | POA: Diagnosis present

## 2018-11-09 DIAGNOSIS — F17210 Nicotine dependence, cigarettes, uncomplicated: Secondary | ICD-10-CM | POA: Diagnosis present

## 2018-11-09 DIAGNOSIS — S72009A Fracture of unspecified part of neck of unspecified femur, initial encounter for closed fracture: Secondary | ICD-10-CM | POA: Diagnosis present

## 2018-11-09 DIAGNOSIS — Z96659 Presence of unspecified artificial knee joint: Secondary | ICD-10-CM | POA: Diagnosis present

## 2018-11-09 DIAGNOSIS — I739 Peripheral vascular disease, unspecified: Secondary | ICD-10-CM | POA: Diagnosis present

## 2018-11-09 DIAGNOSIS — T148XXA Other injury of unspecified body region, initial encounter: Secondary | ICD-10-CM

## 2018-11-09 DIAGNOSIS — Z823 Family history of stroke: Secondary | ICD-10-CM | POA: Diagnosis not present

## 2018-11-09 DIAGNOSIS — Z950 Presence of cardiac pacemaker: Secondary | ICD-10-CM

## 2018-11-09 DIAGNOSIS — Y92009 Unspecified place in unspecified non-institutional (private) residence as the place of occurrence of the external cause: Secondary | ICD-10-CM

## 2018-11-09 DIAGNOSIS — M25552 Pain in left hip: Secondary | ICD-10-CM | POA: Diagnosis present

## 2018-11-09 DIAGNOSIS — J449 Chronic obstructive pulmonary disease, unspecified: Secondary | ICD-10-CM | POA: Diagnosis present

## 2018-11-09 DIAGNOSIS — Z7902 Long term (current) use of antithrombotics/antiplatelets: Secondary | ICD-10-CM

## 2018-11-09 DIAGNOSIS — Z8673 Personal history of transient ischemic attack (TIA), and cerebral infarction without residual deficits: Secondary | ICD-10-CM | POA: Diagnosis not present

## 2018-11-09 DIAGNOSIS — S72002A Fracture of unspecified part of neck of left femur, initial encounter for closed fracture: Secondary | ICD-10-CM

## 2018-11-09 DIAGNOSIS — W010XXA Fall on same level from slipping, tripping and stumbling without subsequent striking against object, initial encounter: Secondary | ICD-10-CM | POA: Diagnosis present

## 2018-11-09 DIAGNOSIS — Z419 Encounter for procedure for purposes other than remedying health state, unspecified: Secondary | ICD-10-CM

## 2018-11-09 HISTORY — DX: Presence of cardiac pacemaker: Z95.0

## 2018-11-09 LAB — TYPE AND SCREEN
ABO/RH(D): A POS
Antibody Screen: NEGATIVE

## 2018-11-09 LAB — COMPREHENSIVE METABOLIC PANEL
ALT: 16 U/L (ref 0–44)
AST: 21 U/L (ref 15–41)
Albumin: 3.5 g/dL (ref 3.5–5.0)
Alkaline Phosphatase: 67 U/L (ref 38–126)
Anion gap: 11 (ref 5–15)
BUN: 15 mg/dL (ref 8–23)
CO2: 27 mmol/L (ref 22–32)
Calcium: 8.6 mg/dL — ABNORMAL LOW (ref 8.9–10.3)
Chloride: 101 mmol/L (ref 98–111)
Creatinine, Ser: 1.17 mg/dL — ABNORMAL HIGH (ref 0.44–1.00)
GFR calc Af Amer: 47 mL/min — ABNORMAL LOW (ref >60)
GFR calc non Af Amer: 40 mL/min — ABNORMAL LOW (ref >60)
Glucose, Bld: 130 mg/dL — ABNORMAL HIGH (ref 70–99)
POTASSIUM: 4.1 mmol/L (ref 3.5–5.1)
Sodium: 139 mmol/L (ref 135–145)
Total Bilirubin: 0.3 mg/dL (ref 0.3–1.2)
Total Protein: 6.2 g/dL — ABNORMAL LOW (ref 6.5–8.1)

## 2018-11-09 LAB — CBC WITH DIFFERENTIAL/PLATELET
Abs Immature Granulocytes: 0.03 10*3/uL (ref 0.00–0.07)
Basophils Absolute: 0 10*3/uL (ref 0.0–0.1)
Basophils Relative: 1 %
EOS PCT: 1 %
Eosinophils Absolute: 0.1 10*3/uL (ref 0.0–0.5)
HCT: 39.8 % (ref 36.0–46.0)
Hemoglobin: 13.3 g/dL (ref 12.0–15.0)
Immature Granulocytes: 1 %
LYMPHS ABS: 0.7 10*3/uL (ref 0.7–4.0)
Lymphocytes Relative: 20 %
MCH: 36.5 pg — ABNORMAL HIGH (ref 26.0–34.0)
MCHC: 33.4 g/dL (ref 30.0–36.0)
MCV: 109.3 fL — ABNORMAL HIGH (ref 80.0–100.0)
Monocytes Absolute: 0.4 10*3/uL (ref 0.1–1.0)
Monocytes Relative: 12 %
Neutro Abs: 2.3 10*3/uL (ref 1.7–7.7)
Neutrophils Relative %: 65 %
Platelets: 329 10*3/uL (ref 150–400)
RBC: 3.64 MIL/uL — ABNORMAL LOW (ref 3.87–5.11)
RDW: 13.6 % (ref 11.5–15.5)
WBC: 3.5 10*3/uL — ABNORMAL LOW (ref 4.0–10.5)
nRBC: 0 % (ref 0.0–0.2)

## 2018-11-09 LAB — PROTIME-INR
INR: 0.9 (ref 0.8–1.2)
Prothrombin Time: 12.3 seconds (ref 11.4–15.2)

## 2018-11-09 LAB — TSH: TSH: 2.689 u[IU]/mL (ref 0.350–4.500)

## 2018-11-09 MED ORDER — ATORVASTATIN CALCIUM 10 MG PO TABS
10.0000 mg | ORAL_TABLET | Freq: Every day | ORAL | Status: DC
  Administered 2018-11-11 – 2018-11-16 (×6): 10 mg via ORAL
  Filled 2018-11-09 (×7): qty 1

## 2018-11-09 MED ORDER — ONDANSETRON HCL 4 MG PO TABS: 4.0000 mg | ORAL_TABLET | Freq: Four times a day (QID) | ORAL | Status: DC | PRN

## 2018-11-09 MED ORDER — MUPIROCIN 2 % EX OINT
1.0000 "application " | TOPICAL_OINTMENT | Freq: Two times a day (BID) | CUTANEOUS | Status: AC
  Administered 2018-11-09 – 2018-11-14 (×10): 1 via NASAL
  Filled 2018-11-09: qty 22

## 2018-11-09 MED ORDER — ACETAMINOPHEN 325 MG PO TABS: 650.0000 mg | ORAL_TABLET | Freq: Four times a day (QID) | ORAL | Status: DC | PRN

## 2018-11-09 MED ORDER — POTASSIUM CHLORIDE CRYS ER 10 MEQ PO TBCR
10.0000 meq | EXTENDED_RELEASE_TABLET | Freq: Every day | ORAL | Status: DC
  Administered 2018-11-11 – 2018-11-16 (×6): 10 meq via ORAL
  Filled 2018-11-09 (×6): qty 1

## 2018-11-09 MED ORDER — ONDANSETRON HCL 4 MG/2ML IJ SOLN: 4.0000 mg | Freq: Four times a day (QID) | INTRAMUSCULAR | Status: DC | PRN

## 2018-11-09 MED ORDER — BISACODYL 10 MG RE SUPP: 10.0000 mg | Freq: Every day | RECTAL | Status: DC | PRN

## 2018-11-09 MED ORDER — HYDROCODONE-ACETAMINOPHEN 5-325 MG PO TABS
1.0000 | ORAL_TABLET | ORAL | Status: DC | PRN
  Administered 2018-11-09 – 2018-11-10 (×3): 1 via ORAL
  Filled 2018-11-09 (×3): qty 1

## 2018-11-09 MED ORDER — FUROSEMIDE 20 MG PO TABS
20.0000 mg | ORAL_TABLET | Freq: Two times a day (BID) | ORAL | Status: DC
  Administered 2018-11-09 – 2018-11-16 (×13): 20 mg via ORAL
  Filled 2018-11-09 (×13): qty 1

## 2018-11-09 MED ORDER — ALBUTEROL SULFATE (2.5 MG/3ML) 0.083% IN NEBU: 3.0000 mL | INHALATION_SOLUTION | Freq: Four times a day (QID) | RESPIRATORY_TRACT | Status: DC | PRN

## 2018-11-09 MED ORDER — DOCUSATE SODIUM 100 MG PO CAPS
100.0000 mg | ORAL_CAPSULE | Freq: Two times a day (BID) | ORAL | Status: DC
  Administered 2018-11-09 – 2018-11-16 (×13): 100 mg via ORAL
  Filled 2018-11-09 (×13): qty 1

## 2018-11-09 MED ORDER — DEXTROSE-NACL 5-0.45 % IV SOLN
INTRAVENOUS | Status: DC
  Administered 2018-11-09: 20:00:00 via INTRAVENOUS

## 2018-11-09 MED ORDER — MORPHINE SULFATE (PF) 2 MG/ML IV SOLN
2.0000 mg | INTRAVENOUS | Status: DC | PRN
  Administered 2018-11-09 – 2018-11-10 (×3): 2 mg via INTRAVENOUS
  Filled 2018-11-09 (×2): qty 1

## 2018-11-09 MED ORDER — ALBUTEROL SULFATE (2.5 MG/3ML) 0.083% IN NEBU
2.5000 mg | INHALATION_SOLUTION | Freq: Four times a day (QID) | RESPIRATORY_TRACT | Status: DC
  Administered 2018-11-09: 2.5 mg via RESPIRATORY_TRACT
  Filled 2018-11-09: qty 3

## 2018-11-09 MED ORDER — CLINDAMYCIN PHOSPHATE 600 MG/50ML IV SOLN
600.0000 mg | Freq: Once | INTRAVENOUS | Status: DC
  Filled 2018-11-09: qty 50

## 2018-11-09 MED ORDER — VITAMIN D (ERGOCALCIFEROL) 1.25 MG (50000 UNIT) PO CAPS: 50000.0000 [IU] | ORAL_CAPSULE | ORAL | Status: DC

## 2018-11-09 MED ORDER — MORPHINE SULFATE (PF) 2 MG/ML IV SOLN
INTRAVENOUS | Status: AC
  Filled 2018-11-09: qty 1

## 2018-11-09 MED ORDER — FENTANYL CITRATE (PF) 100 MCG/2ML IJ SOLN
50.0000 ug | Freq: Once | INTRAMUSCULAR | Status: AC
  Administered 2018-11-09: 50 ug via INTRAVENOUS
  Filled 2018-11-09: qty 2

## 2018-11-09 MED ORDER — MOMETASONE FURO-FORMOTEROL FUM 200-5 MCG/ACT IN AERO
2.0000 | INHALATION_SPRAY | Freq: Two times a day (BID) | RESPIRATORY_TRACT | Status: DC
  Administered 2018-11-09 – 2018-11-16 (×12): 2 via RESPIRATORY_TRACT
  Filled 2018-11-09: qty 8.8

## 2018-11-09 MED ORDER — HYDROXYUREA 500 MG PO CAPS
500.0000 mg | ORAL_CAPSULE | ORAL | Status: DC
  Administered 2018-11-11 – 2018-11-16 (×3): 500 mg via ORAL
  Filled 2018-11-09 (×4): qty 1

## 2018-11-09 NOTE — ED Notes (Signed)
Lab called and stated that type and screen had hemolyzed - advised that lab would need to come and recollect sample

## 2018-11-09 NOTE — ED Triage Notes (Signed)
Pt brought in via ems from [place] with a fall.  Pt has left hip pain.  Pt missed the chair and fell.  Pt alert.   md with pt on arrival to treatment room

## 2018-11-09 NOTE — ED Notes (Signed)
Report given to Audrey RN

## 2018-11-09 NOTE — ED Notes (Signed)
At this time no blood orders placed - sent full rainbow to lab

## 2018-11-09 NOTE — H&P (Addendum)
[facility] [place] NAME: Krista Ortiz    MR#:  [id number]  DATE OF BIRTH:  [birth year]  DATE OF ADMISSION:  11/09/2018  PRIMARY CARE PHYSICIAN: Hortencia Pilar, MD   REQUESTING/REFERRING PHYSICIAN:   CHIEF COMPLAINT:   Chief Complaint  Patient presents with  . Fall  . Hip Pain    HISTORY OF PRESENT ILLNESS: Krista Ortiz  is a 85 y.o. female with a known history of per below, status post accidental fall while at home while trying to sit in her wheelchair, resulting in left hip pain, and emergency room patient was found to have left close intertrochanteric fracture, ED attending did discuss case with orthopedic surgery/Dr. Roland Rack whom is planning operative procedure on tomorrow, patient hydrated in the emergency room, son is at the bedside, patient in moderate to severe pain due to fracture, patient is now being admitted for acute left hip fracture status post mechanical fall.  PAST MEDICAL HISTORY:   Past Medical History:  Diagnosis Date  . Acoustic neuroma [facility])    md notes says history of acoustic neuroma or meningioma followed up by yearly MRI  . Chronic back pain   . COPD (chronic obstructive pulmonary disease) ([place])   . Hyperlipidemia   . Knee joint replacement status, right 11/25/2016  . Polycythemia   . Polycythemia, secondary 04/26/2015  . Port-A-Cath in place   . PVD (peripheral vascular disease) ([place])   . Right wrist fracture 11/25/2016  . Thoracic compression fracture, closed, initial encounter ([place]) 11/25/2016  . TIA (transient ischemic attack)     PAST SURGICAL HISTORY:  Past Surgical History:  Procedure Laterality Date  . ABDOMINAL HYSTERECTOMY    . APPENDECTOMY    . BACK SURGERY    . cataracts    . CHOLECYSTECTOMY    . PACEMAKER INSERTION Left 02/18/2015   Procedure: INSERTION PACEMAKER;  Surgeon: Isaias Cowman, MD;  Location: [facility] ORS;  Service: Cardiovascular;  Laterality: Left;  . REPLACEMENT TOTAL KNEE     . TONSILLECTOMY      SOCIAL HISTORY:  Social History   Tobacco Use  . Smoking status: Current Every Day Smoker    Packs/day: 0.50    Types: Cigarettes  . Smokeless tobacco: Never Used  Substance Use Topics  . Alcohol use: No    FAMILY HISTORY:  Family History  Problem Relation Age of Onset  . Other Mother        Old age  . Other Father        Old age  . CVA Sister   . Heart attack Brother     DRUG ALLERGIES:  Citalopram Pneumococcal vaccine Requip Penicillin  REVIEW OF SYSTEMS:   CONSTITUTIONAL: No fever, fatigue or weakness.  EYES: No blurred or double vision.  EARS, NOSE, AND THROAT: No tinnitus or ear pain.  RESPIRATORY: No cough, shortness of breath, wheezing or hemoptysis.  CARDIOVASCULAR: No chest pain, orthopnea, edema.  GASTROINTESTINAL: No nausea, vomiting, diarrhea or abdominal pain.  GENITOURINARY: No dysuria, hematuria.  ENDOCRINE: No polyuria, nocturia,  HEMATOLOGY: No anemia, easy bruising or bleeding SKIN: No rash or lesion. MUSCULOSKELETAL: Left hip pain  nEUROLOGIC: No tingling, numbness, weakness.  PSYCHIATRY: No anxiety or depression.   MEDICATIONS AT HOME:  Prior to Admission medications   Medication Sig Start Date End Date Taking? Authorizing Provider  Vitamin D, Ergocalciferol, (DRISDOL) 1.25 MG (50000 UT) CAPS capsule Take 50,000 Units by mouth once a week. 08/15/18  Yes [provider]  acetaminophen (TYLENOL) 325 MG tablet Take 650 mg by mouth every 6 (six) hours as needed.    [provider]  albuterol (PROVENTIL) (2.5 MG/3ML) 0.083% nebulizer solution Inhale 3 mLs into the lungs every 6 (six) hours as needed. 12/23/15   [provider]  atorvastatin (LIPITOR) 10 MG tablet Take 10 mg by mouth daily.    [provider]  budesonide-formoterol (SYMBICORT) 160-4.5 MCG/ACT inhaler Inhale 2 Inhalers 2 (two) times daily into the lungs. 02/23/17 07/14/18  [provider]  CIPRODEX OTIC suspension  Place 4 drops into both ears 2 (two) times daily. 08/10/18   [provider]  clopidogrel (PLAVIX) 75 MG tablet Take 75 mg by mouth daily.    [provider]  furosemide (LASIX) 20 MG tablet Take 20 mg by mouth 2 (two) times daily.     [provider]  hydroxyurea (HYDREA) 500 MG capsule Take 500 mg by mouth 3 (three) times a week. Pt takes on Monday, Wednesday, and Friday.    [provider]  potassium chloride (K-DUR) 10 MEQ tablet Take 10 mEq by mouth daily.    [provider]      PHYSICAL EXAMINATION:   VITAL SIGNS: Blood pressure (!) 155/61, temperature 97.9 F (36.6 C), temperature source Oral, resp. rate 20, height 5' (1.524 m), weight 59 kg, SpO2 93 %.  GENERAL:  85 y.o.-year-old patient lying in the bed with no acute distress.  Frail-appearing EYES: Pupils equal, round, reactive to light and accommodation. No scleral icterus. Extraocular muscles intact.  HEENT: Head atraumatic, normocephalic. Oropharynx and nasopharynx clear.  NECK:  Supple, no jugular venous distention. No thyroid enlargement, no tenderness.  LUNGS: Normal breath sounds bilaterally, no wheezing, rales,rhonchi or crepitation. No use of accessory muscles of respiration.  CARDIOVASCULAR: S1, S2 normal. No murmurs, rubs, or gallops.  ABDOMEN: Soft, nontender, nondistended. Bowel sounds present. No organomegaly or mass.  EXTREMITIES: No pedal edema, cyanosis, or clubbing.  Decreased range of motion due to pain left lower extremity NEUROLOGIC: Cranial nerves II through XII are intact. Muscle strength 5/5 in all extremities. Sensation intact. Gait not checked.  PSYCHIATRIC: The patient is alert and oriented x 3.  SKIN: No obvious rash, lesion, or ulcer.   LABORATORY PANEL:   CBC No results for input(s): WBC, HGB, HCT, PLT, MCV, MCH, MCHC, RDW, LYMPHSABS, MONOABS, EOSABS, BASOSABS, BANDABS in the last 168 hours.  Invalid input(s): NEUTRABS,  BANDSABD ------------------------------------------------------------------------------------------------------------------  Chemistries  No results for input(s): NA, K, CL, CO2, GLUCOSE, BUN, CREATININE, CALCIUM, MG, AST, ALT, ALKPHOS, BILITOT in the last 168 hours.  Invalid input(s): GFRCGP ------------------------------------------------------------------------------------------------------------------ CrCl cannot be calculated (Patient's most recent lab result is older than the maximum 21 days allowed.). ------------------------------------------------------------------------------------------------------------------ No results for input(s): TSH, T4TOTAL, T3FREE, THYROIDAB in the last 72 hours.  Invalid input(s): FREET3   Coagulation profile No results for input(s): INR, PROTIME in the last 168 hours. ------------------------------------------------------------------------------------------------------------------- No results for input(s): DDIMER in the last 72 hours. -------------------------------------------------------------------------------------------------------------------  Cardiac Enzymes No results for input(s): CKMB, TROPONINI, MYOGLOBIN in the last 168 hours.  Invalid input(s): CK ------------------------------------------------------------------------------------------------------------------ Invalid input(s): POCBNP  ---------------------------------------------------------------------------------------------------------------  Urinalysis    Component Value Date/Time   COLORURINE YELLOW (A) 11/30/2015 0338   APPEARANCEUR CLEAR (A) 11/30/2015 0338   LABSPEC 1.019 11/30/2015 0338   PHURINE 7.0 11/30/2015 0338   GLUCOSEU NEGATIVE 11/30/2015 0338   HGBUR 2+ (A) 11/30/2015 0338   BILIRUBINUR NEGATIVE 11/30/2015 0338   KETONESUR TRACE (A) 11/30/2015 0338   PROTEINUR 30 (A)  11/30/2015 0338   NITRITE NEGATIVE 11/30/2015 0338   LEUKOCYTESUR NEGATIVE 11/30/2015  8756     RADIOLOGY: Dg Chest 1 View  Result Date: 11/09/2018 CLINICAL DATA:  Pre operative respiratory exam.  Left hip fracture. EXAM: CHEST  1 VIEW COMPARISON:  Chest x-ray dated 02/15/2017 FINDINGS: Heart size and vascularity are normal. Tortuosity and calcification of the thoracic aorta. Pacemaker and Port-A-Cath in place, unchanged. Tip of the Port-A-Cath is just above the cavoatrial junction. Lungs are clear.  No acute bone abnormality. IMPRESSION: No acute abnormalities. Aortic Atherosclerosis (ICD10-I70.0). Electronically Signed   By: Lorriane Shire M.D.   On: 11/09/2018 16:52   Dg Ankle Complete Left  Result Date: 11/09/2018 CLINICAL DATA:  Trauma secondary to a fall today. Skin tear on the lower leg. Hip fracture. EXAM: LEFT ANKLE COMPLETE - 3+ VIEW COMPARISON:  None. FINDINGS: There is no fracture or dislocation or joint effusion. Small plantar calcaneal enthesophyte. Soft tissue swelling circumferentially around the ankle, most prominent anteriorly. IMPRESSION: No acute bone abnormality.  Soft tissue swelling. Electronically Signed   By: Lorriane Shire M.D.   On: 11/09/2018 16:50   Ct Head Wo Contrast  Result Date: 11/09/2018 CLINICAL DATA:  85 y/o  F; EXAM: CT HEAD WITHOUT CONTRAST TECHNIQUE: Contiguous axial images were obtained from the base of the skull through the vertex without intravenous contrast. COMPARISON:  01/27/2017 CT head. 03/07/2010 MRI head. FINDINGS: Brain: Stable left CP angle mass compatible with meningioma measuring up to 2 cm.No additional evidence of acute infarction, hemorrhage, hydrocephalus, extra-axial collection or mass lesion/mass effect. Nonspecific white matter hypodensities are compatible with chronic microvascular ischemic changes and there is volume loss of the brain which is stable from prior CT. Stable small chronic infarction within the left occipital lobe. Vascular: Calcific atherosclerosis of the carotid siphons and the vertebral arteries. No hyperdense  vessel. Skull: Focus of right lateral scalp soft tissue thickening possibly a contusion. No calvarial fracture. Sinuses/Orbits: Mild maxillary and ethmoid sinus mucosal thickening. Aerosolized secretions in the left sphenoid sinus. Chronic inflammatory changes of the walls of the sphenoid sinuses. Partial opacification of right mastoid air cells. Bilateral intra-ocular lens replacement. Other: None. IMPRESSION: 1. No acute intracranial abnormality identified. 2. Stable left cerebellopontine angle meningioma. 3. Stable chronic microvascular ischemic changes and parenchymal volume loss of the brain. 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 Electronically Signed   By: Kristine Garbe M.D.   On: 11/09/2018 16:56   Dg Hip Unilat W Or Wo Pelvis 2-3 Views Left  Result Date: 11/09/2018 CLINICAL DATA:  85 y/o  F; fall with left hip pain. EXAM: DG HIP (WITH OR WITHOUT PELVIS) 2-3V LEFT COMPARISON:  11/30/2015 pelvis and left hip radiographs. FINDINGS: Acute minimally displaced left femur intertrochanteric fracture with coxa vara angulation. No pelvic fracture or diastasis identified. No dislocation of the hip joints. IMPRESSION: Acute minimally displaced left femur intertrochanteric fracture with coxa vara angulation. Electronically Signed   By: Kristine Garbe M.D.   On: 11/09/2018 16:49    EKG: Orders placed or performed during the hospital encounter of 11/09/18  . EKG 12-Lead  . EKG 12-Lead    IMPRESSION AND PLAN: *Acute left close intertrochanteric hip fracture Secondary to mechanical fall Admit to regular nursing for bed, Dr. Poggi/orthopedic surgery notified in the emergency room by ED attending, adult pain protocol, n.p.o. after midnight, follow-up on admission blood work which is currently pending  *COPD without exacerbation Breathing treatments PRN  *Chronic hyperlipidemia, unspecified Continue statin therapy  *  History of TIA Continue statin therapy, hold  Plavix  *Abnormal CT of the head Noted for meningioma Can follow-up as an outpatient with neurosurgery if patient wishes Given advanced age would consider conservative therapy/monitoring   All the records are reviewed and case discussed with ED provider. Management plans discussed with the patient, family and they are in agreement.  CODE STATUS:dnr Code Status History    Date Active Date Inactive Code Status Order ID Comments User Context   02/16/2017 0014 02/16/2017 1739 Full Code [id number]  Harvie Bridge, DO Inpatient   02/18/2015 1555 02/19/2015 1451 Full Code [id number]  Isaias Cowman, MD Inpatient   02/15/2015 1645 02/18/2015 1555 Full Code [id number]  Henreitta Leber, MD Inpatient       TOTAL TIME TAKING CARE OF THIS PATIENT: 45 minutes.    Avel Peace Earnest Thalman M.D on 11/09/2018   Between 7am to 6pm - Pager - [phone]  After 6pm go to [email or web address] - password [email or web address] [facility]  Office  [phone]  CC: Primary care physician; Hortencia Pilar, MD   Note: This dictation was prepared with Dragon dictation along with smaller phrase technology. Any transcriptional errors that result from this process are unintentional.

## 2018-11-09 NOTE — ED Notes (Addendum)
ED TO INPATIENT HANDOFF REPORT  ED Nurse Name and Phone #: Helene Kelp [phone]  S Name/Age/Gender Krista Ortiz 85 y.o. female Room/Bed: ED14A/ED14A  Code Status   Code Status: Prior  Home/SNF/Other Home A&O x4 Is this baseline? Yes   Triage Complete: Triage complete  Chief Complaint Hip Pain  Triage Note Pt brought in via ems from [facility] with a fall.  Pt has left hip pain.  Pt missed the chair and fell.  Pt alert.   md with pt on arrival to treatment room     Allergies Allergies  Allergen Reactions  . Citalopram Other (See Comments)    Reaction:  Fatigue   . Pneumococcal Vaccine Other (See Comments)    Doesn't remember what occurred.  . Pneumovax [Pneumococcal Polysaccharide Vaccine] Other (See Comments)    Reaction:  Unknown   . Requip [Ropinirole Hcl] Other (See Comments)    Reaction:  Dry mouth     Level of Care/Admitting Diagnosis ED Disposition    ED Disposition Condition [facility] Area: [place] [[id number]]  Level of Care: Med-Surg [16]  Diagnosis: Hip fracture [facility]) [[id number]]  Admitting Physician: Gorden Harms [[id number]]  Attending Physician: Gorden Harms [[id number]]  Estimated length of stay: past midnight tomorrow  Certification:: I certify this patient will need inpatient services for at least 2 midnights  PT Class (Do Not Modify): Inpatient [101]  PT Acc Code (Do Not Modify): Private [1]       B Medical/Surgery History Past Medical History:  Diagnosis Date  . Acoustic neuroma [facility])    md notes says history of acoustic neuroma or meningioma followed up by yearly MRI  . Chronic back pain   . COPD (chronic obstructive pulmonary disease) ([place])   . Hyperlipidemia   . Knee joint replacement status, right 11/25/2016  . Polycythemia   . Polycythemia, secondary 04/26/2015  . Port-A-Cath in place   . PVD (peripheral vascular disease) ([place])   . Right wrist fracture 11/25/2016  . Thoracic  compression fracture, closed, initial encounter ([place]) 11/25/2016  . TIA (transient ischemic attack)    Past Surgical History:  Procedure Laterality Date  . ABDOMINAL HYSTERECTOMY    . APPENDECTOMY    . BACK SURGERY    . cataracts    . CHOLECYSTECTOMY    . PACEMAKER INSERTION Left 02/18/2015   Procedure: INSERTION PACEMAKER;  Surgeon: Isaias Cowman, MD;  Location: [facility] ORS;  Service: Cardiovascular;  Laterality: Left;  . REPLACEMENT TOTAL KNEE    . TONSILLECTOMY       A IV Location/Drains/Wounds Patient Lines/Drains/Airways Status   Active Line/Drains/Airways    Name:   Placement date:   Placement time:   Site:   Days:   Implanted Port 07/14/11 Right Chest   07/14/11    1442    Chest   2675   Peripheral IV 02/15/17 Left Forearm   02/15/17    2007    Forearm   632   Peripheral IV 06/10/18 Left Antecubital   06/10/18    0938    Antecubital   152   Peripheral IV 11/09/18 Left Forearm   11/09/18    1612    Forearm   less than 1   Wound / Incision (Open or Dehisced) 02/15/17 Non-pressure wound Arm Right;Upper   02/15/17    2345    Arm   632          Intake/Output Last 24 hours No intake or  output data in the 24 hours ending 11/09/18 1749  Labs/Imaging Results for orders placed or performed during the hospital encounter of 11/09/18 (from the past 48 hour(s))  CBC with Differential     Status: Abnormal   Collection Time: 11/09/18  5:10 PM  Result Value Ref Range   WBC 3.5 (L) 4.0 - 10.5 K/uL   RBC 3.64 (L) 3.87 - 5.11 MIL/uL   Hemoglobin 13.3 12.0 - 15.0 g/dL   HCT 39.8 36.0 - 46.0 %   MCV 109.3 (H) 80.0 - 100.0 fL   MCH 36.5 (H) 26.0 - 34.0 pg   MCHC 33.4 30.0 - 36.0 g/dL   RDW 13.6 11.5 - 15.5 %   Platelets 329 150 - 400 K/uL   nRBC 0.0 0.0 - 0.2 %   Neutrophils Relative % 65 %   Neutro Abs 2.3 1.7 - 7.7 K/uL   Lymphocytes Relative 20 %   Lymphs Abs 0.7 0.7 - 4.0 K/uL   Monocytes Relative 12 %   Monocytes Absolute 0.4 0.1 - 1.0 K/uL   Eosinophils Relative 1 %    Eosinophils Absolute 0.1 0.0 - 0.5 K/uL   Basophils Relative 1 %   Basophils Absolute 0.0 0.0 - 0.1 K/uL   Immature Granulocytes 1 %   Abs Immature Granulocytes 0.03 0.00 - 0.07 K/uL    Comment: Performed at [facility], [address] [place], [place] [address]  Type and screen [place]     Status: None (Preliminary result)   Collection Time: 11/09/18  5:10 PM  Result Value Ref Range   ABO/RH(D) PENDING    Antibody Screen PENDING    Sample Expiration      11/12/2018 Performed at [facility], [address] [place], [place] [address]   Comprehensive metabolic panel     Status: Abnormal   Collection Time: 11/09/18  5:10 PM  Result Value Ref Range   Sodium 139 135 - 145 mmol/L   Potassium 4.1 3.5 - 5.1 mmol/L   Chloride 101 98 - 111 mmol/L   CO2 27 22 - 32 mmol/L   Glucose, Bld 130 (H) 70 - 99 mg/dL   BUN 15 8 - 23 mg/dL   Creatinine, Ser 1.17 (H) 0.44 - 1.00 mg/dL   Calcium 8.6 (L) 8.9 - 10.3 mg/dL   Total Protein 6.2 (L) 6.5 - 8.1 g/dL   Albumin 3.5 3.5 - 5.0 g/dL   AST 21 15 - 41 U/L   ALT 16 0 - 44 U/L   Alkaline Phosphatase 67 38 - 126 U/L   Total Bilirubin 0.3 0.3 - 1.2 mg/dL   GFR calc non Af Amer 40 (L) >60 mL/min   GFR calc Af Amer 47 (L) >60 mL/min   Anion gap 11 5 - 15    Comment: Performed at [facility], [address] [place], [place] [address]  Protime-INR     Status: None   Collection Time: 11/09/18  5:10 PM  Result Value Ref Range   Prothrombin Time 12.3 11.4 - 15.2 seconds   INR 0.9 0.8 - 1.2    Comment: (NOTE) INR goal varies based on device and disease states. Performed at [facility], [address] [place], [place] [address]    Dg Chest 1 View  Result Date: 11/09/2018 CLINICAL DATA:  Pre operative respiratory exam.  Left hip fracture. EXAM: CHEST  1 VIEW COMPARISON:  Chest x-ray dated 02/15/2017 FINDINGS: Heart size and vascularity are normal. Tortuosity  and calcification of the  thoracic aorta. Pacemaker and Port-A-Cath in place, unchanged. Tip of the Port-A-Cath is just above the cavoatrial junction. Lungs are clear.  No acute bone abnormality. IMPRESSION: No acute abnormalities. Aortic Atherosclerosis (ICD10-I70.0). Electronically Signed   By: Lorriane Shire M.D.   On: 11/09/2018 16:52   Dg Ankle Complete Left  Result Date: 11/09/2018 CLINICAL DATA:  Trauma secondary to a fall today. Skin tear on the lower leg. Hip fracture. EXAM: LEFT ANKLE COMPLETE - 3+ VIEW COMPARISON:  None. FINDINGS: There is no fracture or dislocation or joint effusion. Small plantar calcaneal enthesophyte. Soft tissue swelling circumferentially around the ankle, most prominent anteriorly. IMPRESSION: No acute bone abnormality.  Soft tissue swelling. Electronically Signed   By: Lorriane Shire M.D.   On: 11/09/2018 16:50   Ct Head Wo Contrast  Result Date: 11/09/2018 CLINICAL DATA:  85 y/o  F; EXAM: CT HEAD WITHOUT CONTRAST TECHNIQUE: Contiguous axial images were obtained from the base of the skull through the vertex without intravenous contrast. COMPARISON:  01/27/2017 CT head. 03/07/2010 MRI head. FINDINGS: Brain: Stable left CP angle mass compatible with meningioma measuring up to 2 cm.No additional evidence of acute infarction, hemorrhage, hydrocephalus, extra-axial collection or mass lesion/mass effect. Nonspecific white matter hypodensities are compatible with chronic microvascular ischemic changes and there is volume loss of the brain which is stable from prior CT. Stable small chronic infarction within the left occipital lobe. Vascular: Calcific atherosclerosis of the carotid siphons and the vertebral arteries. No hyperdense vessel. Skull: Focus of right lateral scalp soft tissue thickening possibly a contusion. No calvarial fracture. Sinuses/Orbits: Mild maxillary and ethmoid sinus mucosal thickening. Aerosolized secretions in the left sphenoid sinus. Chronic inflammatory changes of the walls of the  sphenoid sinuses. Partial opacification of right mastoid air cells. Bilateral intra-ocular lens replacement. Other: None. IMPRESSION: 1. No acute intracranial abnormality identified. 2. Stable left cerebellopontine angle meningioma. 3. Stable chronic microvascular ischemic changes and parenchymal volume loss of the brain. 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 Electronically Signed   By: Kristine Garbe M.D.   On: 11/09/2018 16:56   Dg Hip Unilat W Or Wo Pelvis 2-3 Views Left  Result Date: 11/09/2018 CLINICAL DATA:  85 y/o  F; fall with left hip pain. EXAM: DG HIP (WITH OR WITHOUT PELVIS) 2-3V LEFT COMPARISON:  11/30/2015 pelvis and left hip radiographs. FINDINGS: Acute minimally displaced left femur intertrochanteric fracture with coxa vara angulation. No pelvic fracture or diastasis identified. No dislocation of the hip joints. IMPRESSION: Acute minimally displaced left femur intertrochanteric fracture with coxa vara angulation. Electronically Signed   By: Kristine Garbe M.D.   On: 11/09/2018 16:49    Pending Labs Unresulted Labs (From admission, onward)    Start     Ordered   11/09/18 1745  Type and screen  Once,   STAT     11/09/18 1745   11/09/18 1721  Urinalysis, Complete w Microscopic  ONCE - STAT,   STAT     11/09/18 1720   Signed and Held  TSH  Once,   R     Signed and Held   Signed and Held  Basic metabolic panel  Tomorrow morning,   R     Signed and Held   Signed and Held  CBC  Tomorrow morning,   R     Signed and Held          Vitals/Pain Today's Vitals   11/09/18 1556 11/09/18 1557 11/09/18 1600  BP:   (!) 155/61  Resp:   20  Temp:   97.9 F (36.6 C)  TempSrc:   Oral  SpO2:   93%  Weight:  59 kg   Height:  5' (1.524 m)   PainSc: 10-Worst pain ever      Isolation Precautions No active isolations  Medications Medications  clindamycin (CLEOCIN) IVPB 600 mg (has no administration in time range)  fentaNYL (SUBLIMAZE) injection  50 mcg (50 mcg Intravenous Given 11/09/18 1710)    Mobility walks with device Moderate fall risk   Focused Assessments Pulmonary Assessment Handoff:  Lung sounds:   O2 Device: Room Air        R Recommendations: See Admitting Provider Note  Report given to:   Additional Notes: fx left hip

## 2018-11-09 NOTE — ED Notes (Signed)
Xray called with positive hip - cxr ordered and approved by Dr Burlene Arnt

## 2018-11-09 NOTE — ED Provider Notes (Addendum)
[facility] Emergency Department Provider Note  ____________________________________________   I have reviewed the triage vital signs and the nursing notes. Where available I have reviewed prior notes and, if possible and indicated, outside hospital notes.    HISTORY  Chief Complaint Fall and Hip Pain    HPI Krista Ortiz is a 85 y.o. female  With a history of medical problems on Plavix, states that she missed her chair and fell hitting her left hip.  She thinks she did not hit her head.  It was a non-syncopal event.  No other injury, she has left hip pain and left ankle discomfort.  She used her life alert bracelet.  EMS gave her fentanyl x2.  No other antecedent intervention, no prior treatment otherwise.  Nothing makes it better nothing makes it worse, she has pain in her left hip which is sharp and nonradiating.  It is better since the fentanyl but still present. Is back pain or other injury   Past Medical History:  Diagnosis Date  . Acoustic neuroma [facility])    md notes says history of acoustic neuroma or meningioma followed up by yearly MRI  . Chronic back pain   . COPD (chronic obstructive pulmonary disease) ([place])   . Hyperlipidemia   . Knee joint replacement status, right 11/25/2016  . Polycythemia   . Polycythemia, secondary 04/26/2015  . Port-A-Cath in place   . PVD (peripheral vascular disease) ([place])   . Right wrist fracture 11/25/2016  . Thoracic compression fracture, closed, initial encounter ([place]) 11/25/2016  . TIA (transient ischemic attack)     Patient Active Problem List   Diagnosis Date Noted  . COPD (chronic obstructive pulmonary disease) ([place]) 02/16/2017  . COPD exacerbation ([place]) 02/15/2017  . Polycythemia 04/02/2016  . Back ache 10/03/2015  . Chronic obstructive pulmonary disease ([place]) 10/03/2015  . Dizziness 10/03/2015  . Decrease in the ability to hear 10/03/2015  . Neoplasm of meninges 10/03/2015  . Combined fat and  carbohydrate induced hyperlipemia 10/03/2015  . Neuroma 10/03/2015  . Impaired vision 10/03/2015  . Presence of other vascular implants and grafts 10/03/2015  . Current smoker 10/03/2015  . Temporary cerebral vascular dysfunction 10/03/2015  . Abnormal CBC 06/12/2015  . Chronic LBP 06/12/2015  . Polycythemia vera ([place]) 04/26/2015  . Hypersensitivity to pneumococcal vaccine 02/21/2015  . Carotid artery narrowing 02/19/2015  . Sick sinus syndrome ([place]) 02/16/2015  . Syncope and collapse 02/15/2015  . Edema leg 10/25/2014  . TI (tricuspid incompetence) 10/25/2014  . Breathlessness on exertion 10/25/2014  . Chest pain at rest 10/03/2014  . Dyspnea, paroxysmal nocturnal 10/03/2014  . Amnesia 04/02/2014  . Abnormal weight loss 04/02/2014  . Osteopenia 03/15/2014  . Chronic pain 07/06/2013  . Peripheral vascular disease ([place]) 06/27/2013  . Ejection murmur 12/09/2012    Past Surgical History:  Procedure Laterality Date  . ABDOMINAL HYSTERECTOMY    . APPENDECTOMY    . BACK SURGERY    . cataracts    . CHOLECYSTECTOMY    . PACEMAKER INSERTION Left 02/18/2015   Procedure: INSERTION PACEMAKER;  Surgeon: Isaias Cowman, MD;  Location: [facility] ORS;  Service: Cardiovascular;  Laterality: Left;  . REPLACEMENT TOTAL KNEE    . TONSILLECTOMY      Prior to Admission medications   Medication Sig Start Date End Date Taking? Authorizing Provider  Vitamin D, Ergocalciferol, (DRISDOL) 1.25 MG (50000 UT) CAPS capsule Take 50,000 Units by mouth once a week. 08/15/18  Yes [provider]  acetaminophen (TYLENOL)  325 MG tablet Take 650 mg by mouth every 6 (six) hours as needed.    [provider]  albuterol (PROVENTIL) (2.5 MG/3ML) 0.083% nebulizer solution Inhale 3 mLs into the lungs every 6 (six) hours as needed. 12/23/15   [provider]  atorvastatin (LIPITOR) 10 MG tablet Take 10 mg by mouth daily.    [provider]  budesonide-formoterol (SYMBICORT) 160-4.5  MCG/ACT inhaler Inhale 2 Inhalers 2 (two) times daily into the lungs. 02/23/17 07/14/18  [provider]  CIPRODEX OTIC suspension Place 4 drops into both ears 2 (two) times daily. 08/10/18   [provider]  clopidogrel (PLAVIX) 75 MG tablet Take 75 mg by mouth daily.    [provider]  furosemide (LASIX) 20 MG tablet Take 20 mg by mouth 2 (two) times daily.     [provider]  hydroxyurea (HYDREA) 500 MG capsule Take 500 mg by mouth 3 (three) times a week. Pt takes on Monday, Wednesday, and Friday.    [provider]  potassium chloride (K-DUR) 10 MEQ tablet Take 10 mEq by mouth daily.    [provider]    Allergies Citalopram; Pneumococcal vaccine; Pneumovax [pneumococcal polysaccharide vaccine]; Requip [ropinirole hcl]; and Penicillins  Family History  Problem Relation Age of Onset  . Other Mother        Old age  . Other Father        Old age  . CVA Sister   . Heart attack Brother     Social History Social History   Tobacco Use  . Smoking status: Current Every Day Smoker    Packs/day: 0.50    Types: Cigarettes  . Smokeless tobacco: Never Used  Substance Use Topics  . Alcohol use: No  . Drug use: No    Review of Systems Constitutional: No fever/chills Eyes: No visual changes. ENT: No sore throat. No stiff neck no neck pain Cardiovascular: Denies chest pain. Respiratory: Denies shortness of breath. Gastrointestinal:   no vomiting.  No diarrhea.  No constipation. Genitourinary: Negative for dysuria. Musculoskeletal: Negative lower extremity swelling Skin: Negative for rash. Neurological: Negative for severe headaches, focal weakness or numbness.   ____________________________________________   PHYSICAL EXAM:  VITAL SIGNS: ED Triage Vitals  Enc Vitals Group     BP 11/09/18 1600 (!) 155/61     Pulse --      Resp 11/09/18 1600 20     Temp 11/09/18 1600 97.9 F (36.6 C)     Temp Source 11/09/18 1600 Oral      SpO2 11/09/18 1600 93 %     Weight 11/09/18 1557 130 lb (59 kg)     Height 11/09/18 1557 5' (1.524 m)     Head Circumference --      Peak Flow --      Pain Score 11/09/18 1556 10     Pain Loc --      Pain Edu? --      Excl. in [place]? --     Constitutional: Alert and oriented. Well appearing and in no acute distress.  Of hearing Eyes: Conjunctivae are normal Head: Atraumatic HEENT: No congestion/rhinnorhea. Mucous membranes are moist.  Oropharynx non-erythematous Neck:   Nontender with no meningismus, no masses, no stridor Cardiovascular: Normal rate, regular rhythm. Grossly normal heart sounds.  Good peripheral circulation. Respiratory: Normal respiratory effort.  No retractions. Lungs CTAB. Abdominal: Soft and nontender. No distention. No guarding no rebound Back:  There is no focal tenderness or step off.  there is no midline tenderness there are no lesions noted. there is no CVA tenderness Musculoskeletal: Upper extremity tenderness, there is tenderness palpation the left hip but no obvious deformity, strong distal pulses are noted, there is a skin tear to the left pretibial region and there is some edema around the left ankle with slight bony tenderness.. No joint effusions, no DVT signs strong distal pulses no edema Neurologic:  Normal speech and language. No gross focal neurologic deficits are appreciated.  Skin:  Skin is warm, dry and intact. No rash noted. Psychiatric: Mood and affect are normal. Speech and behavior are normal.  ____________________________________________   LABS (all labs ordered are listed, but only abnormal results are displayed)  Labs Reviewed - No data to display  Pertinent labs  results that were available during my care of the patient were reviewed by me and considered in my medical decision making (see chart for details). ____________________________________________  EKG  I personally interpreted any EKGs ordered by me or  triage  ____________________________________________  RADIOLOGY  Pertinent labs & imaging results that were available during my care of the patient were reviewed by me and considered in my medical decision making (see chart for details). If possible, patient and/or family made aware of any abnormal findings.  Dg Chest 1 View  Result Date: 11/09/2018 CLINICAL DATA:  Pre operative respiratory exam.  Left hip fracture. EXAM: CHEST  1 VIEW COMPARISON:  Chest x-ray dated 02/15/2017 FINDINGS: Heart size and vascularity are normal. Tortuosity and calcification of the thoracic aorta. Pacemaker and Port-A-Cath in place, unchanged. Tip of the Port-A-Cath is just above the cavoatrial junction. Lungs are clear.  No acute bone abnormality. IMPRESSION: No acute abnormalities. Aortic Atherosclerosis (ICD10-I70.0). Electronically Signed   By: Lorriane Shire M.D.   On: 11/09/2018 16:52   Dg Ankle Complete Left  Result Date: 11/09/2018 CLINICAL DATA:  Trauma secondary to a fall today. Skin tear on the lower leg. Hip fracture. EXAM: LEFT ANKLE COMPLETE - 3+ VIEW COMPARISON:  None. FINDINGS: There is no fracture or dislocation or joint effusion. Small plantar calcaneal enthesophyte. Soft tissue swelling circumferentially around the ankle, most prominent anteriorly. IMPRESSION: No acute bone abnormality.  Soft tissue swelling. Electronically Signed   By: Lorriane Shire M.D.   On: 11/09/2018 16:50   Ct Head Wo Contrast  Result Date: 11/09/2018 CLINICAL DATA:  85 y/o  F; EXAM: CT HEAD WITHOUT CONTRAST TECHNIQUE: Contiguous axial images were obtained from the base of the skull through the vertex without intravenous contrast. COMPARISON:  01/27/2017 CT head. 03/07/2010 MRI head. FINDINGS: Brain: Stable left CP angle mass compatible with meningioma measuring up to 2 cm.No additional evidence of acute infarction, hemorrhage, hydrocephalus, extra-axial collection or mass lesion/mass effect. Nonspecific white matter hypodensities  are compatible with chronic microvascular ischemic changes and there is volume loss of the brain which is stable from prior CT. Stable small chronic infarction within the left occipital lobe. Vascular: Calcific atherosclerosis of the carotid siphons and the vertebral arteries. No hyperdense vessel. Skull: Focus of right lateral scalp soft tissue thickening possibly a contusion. No calvarial fracture. Sinuses/Orbits: Mild maxillary and ethmoid sinus mucosal thickening. Aerosolized secretions in the left sphenoid sinus. Chronic inflammatory changes of the walls of the sphenoid sinuses. Partial opacification of right mastoid air cells. Bilateral intra-ocular lens replacement. Other: None. IMPRESSION: 1. No acute intracranial abnormality identified. 2. Stable left cerebellopontine angle meningioma. 3. Stable chronic microvascular ischemic changes and parenchymal volume loss of the brain. Stable small chronic infarct  in the left occipital lobe. Electronically Signed   By: Kristine Garbe M.D.   On: 11/09/2018 16:56   Dg Hip Unilat W Or Wo Pelvis 2-3 Views Left  Result Date: 11/09/2018 CLINICAL DATA:  85 y/o  F; fall with left hip pain. EXAM: DG HIP (WITH OR WITHOUT PELVIS) 2-3V LEFT COMPARISON:  11/30/2015 pelvis and left hip radiographs. FINDINGS: Acute minimally displaced left femur intertrochanteric fracture with coxa vara angulation. No pelvic fracture or diastasis identified. No dislocation of the hip joints. IMPRESSION: Acute minimally displaced left femur intertrochanteric fracture with coxa vara angulation. Electronically Signed   By: Kristine Garbe M.D.   On: 11/09/2018 16:49   ____________________________________________    PROCEDURES  Procedure(s) performed: None  Procedures  Critical Care performed: None  ____________________________________________   INITIAL IMPRESSION / ASSESSMENT AND PLAN / ED COURSE  Pertinent labs & imaging results that were available during my  care of the patient were reviewed by me and considered in my medical decision making (see chart for details).  Patient here with likely hip fracture, given that she is unsure if she hit her head and she is on Plavix I did a CT scan which is negative, blood work is been ordered now is a pre-surgical work-up however this was a non-syncopal fall.  We are calling orthopedic surgery and I am admitting her to the hospitalist service.  ----------------------------------------- 5:20 PM on 11/09/2018 -----------------------------------------  Discussed with Dr. Wendall Mola, he also agrees with management and will likely operate tomorrow.  Patient and family made aware of findings, giving her pain medication, starting preop work-up.    ____________________________________________   FINAL CLINICAL IMPRESSION(S) / ED DIAGNOSES  Final diagnoses:  Closed fracture of left hip, initial encounter [facility])      This chart was dictated using voice recognition software.  Despite best efforts to proofread,  errors can occur which can change meaning.      Schuyler Amor, MD 11/09/18 1713    Schuyler Amor, MD 11/09/18 [phone]

## 2018-11-09 NOTE — Progress Notes (Signed)
Family Meeting Note  Advance Directive:yes  Today a meeting took place with the Patient.  Patient is able to participate   The following clinical team members were present during this meeting:MD  The following were discussed:Patient's diagnosis: Hip fracture, Patient's progosis: Unable to determine and Goals for treatment: DNR  Additional follow-up to be provided: prn  Time spent during discussion:20 minutes  Gorden Harms, MD

## 2018-11-09 NOTE — ED Notes (Signed)
Ems gave fentanyl 158mcq en route.  Pt has skin tear to left lower leg.  Bleeding controlled.

## 2018-11-10 ENCOUNTER — Inpatient Hospital Stay: Payer: Medicare Other | Admitting: Certified Registered"

## 2018-11-10 ENCOUNTER — Encounter: Payer: Self-pay | Admitting: Anesthesiology

## 2018-11-10 ENCOUNTER — Encounter
Admission: EM | Disposition: A | Discharge status: Skilled Nursing Facility | Payer: Self-pay | Source: Home / Self Care | Attending: Internal Medicine

## 2018-11-10 ENCOUNTER — Inpatient Hospital Stay: Payer: Medicare Other

## 2018-11-10 HISTORY — PX: INTRAMEDULLARY (IM) NAIL INTERTROCHANTERIC: SHX5875

## 2018-11-10 LAB — CBC
HCT: 38.9 % (ref 36.0–46.0)
Hemoglobin: 12.8 g/dL (ref 12.0–15.0)
MCH: 36.4 pg — AB (ref 26.0–34.0)
MCHC: 32.9 g/dL (ref 30.0–36.0)
MCV: 110.5 fL — ABNORMAL HIGH (ref 80.0–100.0)
Platelets: 319 10*3/uL (ref 150–400)
RBC: 3.52 MIL/uL — ABNORMAL LOW (ref 3.87–5.11)
RDW: 13.6 % (ref 11.5–15.5)
WBC: 4.1 10*3/uL (ref 4.0–10.5)
nRBC: 0 % (ref 0.0–0.2)

## 2018-11-10 LAB — BASIC METABOLIC PANEL
Anion gap: 8 (ref 5–15)
BUN: 14 mg/dL (ref 8–23)
CALCIUM: 8.8 mg/dL — AB (ref 8.9–10.3)
CO2: 28 mmol/L (ref 22–32)
Chloride: 99 mmol/L (ref 98–111)
Creatinine, Ser: 1.03 mg/dL — ABNORMAL HIGH (ref 0.44–1.00)
GFR calc Af Amer: 54 mL/min — ABNORMAL LOW (ref >60)
GFR calc non Af Amer: 47 mL/min — ABNORMAL LOW (ref >60)
GLUCOSE: 120 mg/dL — AB (ref 70–99)
Potassium: 3.8 mmol/L (ref 3.5–5.1)
Sodium: 135 mmol/L (ref 135–145)

## 2018-11-10 LAB — SURGICAL PCR SCREEN
MRSA, PCR: POSITIVE — AB
Staphylococcus aureus: POSITIVE — AB

## 2018-11-10 SURGERY — FIXATION, FRACTURE, INTERTROCHANTERIC, WITH INTRAMEDULLARY ROD
Anesthesia: General | Site: Hip | Laterality: Left

## 2018-11-10 MED ORDER — FLEET ENEMA 7-19 GM/118ML RE ENEM: 1.0000 | ENEMA | Freq: Once | RECTAL | Status: DC | PRN

## 2018-11-10 MED ORDER — KETAMINE HCL 50 MG/ML IJ SOLN
INTRAMUSCULAR | Status: AC
  Filled 2018-11-10: qty 10

## 2018-11-10 MED ORDER — OXYCODONE HCL 5 MG PO TABS: 10.0000 mg | ORAL_TABLET | ORAL | Status: DC | PRN

## 2018-11-10 MED ORDER — FENTANYL CITRATE (PF) 100 MCG/2ML IJ SOLN
25.0000 ug | INTRAMUSCULAR | Status: DC | PRN
  Administered 2018-11-10: 25 ug via INTRAVENOUS

## 2018-11-10 MED ORDER — ONDANSETRON HCL 4 MG/2ML IJ SOLN
INTRAMUSCULAR | Status: AC
  Filled 2018-11-10: qty 2

## 2018-11-10 MED ORDER — HYDROMORPHONE HCL 1 MG/ML IJ SOLN: 0.5000 mg | INTRAMUSCULAR | Status: DC | PRN

## 2018-11-10 MED ORDER — SUCCINYLCHOLINE CHLORIDE 20 MG/ML IJ SOLN
INTRAMUSCULAR | Status: AC
  Filled 2018-11-10: qty 1

## 2018-11-10 MED ORDER — ONDANSETRON HCL 4 MG PO TABS: 4.0000 mg | ORAL_TABLET | Freq: Four times a day (QID) | ORAL | Status: DC | PRN

## 2018-11-10 MED ORDER — FENTANYL CITRATE (PF) 100 MCG/2ML IJ SOLN
INTRAMUSCULAR | Status: AC
  Filled 2018-11-10: qty 2

## 2018-11-10 MED ORDER — BISACODYL 10 MG RE SUPP: 10.0000 mg | Freq: Every day | RECTAL | Status: DC | PRN

## 2018-11-10 MED ORDER — CEFAZOLIN SODIUM-DEXTROSE 2-3 GM-%(50ML) IV SOLR
INTRAVENOUS | Status: DC | PRN
  Administered 2018-11-10: 2 g via INTRAVENOUS

## 2018-11-10 MED ORDER — ACETAMINOPHEN 500 MG PO TABS
1000.0000 mg | ORAL_TABLET | Freq: Four times a day (QID) | ORAL | Status: AC
  Administered 2018-11-10 – 2018-11-11 (×3): 1000 mg via ORAL
  Filled 2018-11-10 (×4): qty 2

## 2018-11-10 MED ORDER — METOCLOPRAMIDE HCL 5 MG/ML IJ SOLN: 5.0000 mg | Freq: Three times a day (TID) | INTRAMUSCULAR | Status: DC | PRN

## 2018-11-10 MED ORDER — LIDOCAINE HCL (CARDIAC) PF 100 MG/5ML IV SOSY
PREFILLED_SYRINGE | INTRAVENOUS | Status: DC | PRN
  Administered 2018-11-10: 50 mg via INTRAVENOUS

## 2018-11-10 MED ORDER — ACETAMINOPHEN 325 MG PO TABS
325.0000 mg | ORAL_TABLET | Freq: Four times a day (QID) | ORAL | Status: DC | PRN
  Administered 2018-11-14 – 2018-11-16 (×4): 650 mg via ORAL
  Filled 2018-11-10 (×4): qty 2

## 2018-11-10 MED ORDER — FENTANYL CITRATE (PF) 100 MCG/2ML IJ SOLN
INTRAMUSCULAR | Status: AC
  Administered 2018-11-10: 25 ug via INTRAVENOUS
  Filled 2018-11-10: qty 2

## 2018-11-10 MED ORDER — PROPOFOL 10 MG/ML IV BOLUS
INTRAVENOUS | Status: DC | PRN
  Administered 2018-11-10: 60 mg via INTRAVENOUS

## 2018-11-10 MED ORDER — TRAMADOL HCL 50 MG PO TABS: 50.0000 mg | ORAL_TABLET | Freq: Four times a day (QID) | ORAL | Status: DC | PRN

## 2018-11-10 MED ORDER — DEXAMETHASONE SODIUM PHOSPHATE 10 MG/ML IJ SOLN
INTRAMUSCULAR | Status: DC | PRN
  Administered 2018-11-10: 4 mg via INTRAVENOUS

## 2018-11-10 MED ORDER — CLINDAMYCIN PHOSPHATE 600 MG/50ML IV SOLN
INTRAVENOUS | Status: AC
  Filled 2018-11-10: qty 50

## 2018-11-10 MED ORDER — CHLORHEXIDINE GLUCONATE CLOTH 2 % EX PADS
6.0000 | MEDICATED_PAD | Freq: Every day | CUTANEOUS | Status: AC
  Administered 2018-11-10 – 2018-11-14 (×5): 6 via TOPICAL

## 2018-11-10 MED ORDER — SODIUM CHLORIDE 0.9 % IV SOLN
INTRAVENOUS | Status: DC
  Administered 2018-11-10: 19:00:00 via INTRAVENOUS

## 2018-11-10 MED ORDER — CEFAZOLIN SODIUM-DEXTROSE 2-4 GM/100ML-% IV SOLN
2.0000 g | Freq: Four times a day (QID) | INTRAVENOUS | Status: AC
  Administered 2018-11-10 – 2018-11-11 (×3): 2 g via INTRAVENOUS
  Filled 2018-11-10 (×3): qty 100

## 2018-11-10 MED ORDER — DOCUSATE SODIUM 100 MG PO CAPS: 100.0000 mg | ORAL_CAPSULE | Freq: Two times a day (BID) | ORAL | Status: DC

## 2018-11-10 MED ORDER — ONDANSETRON HCL 4 MG/2ML IJ SOLN: 4.0000 mg | Freq: Four times a day (QID) | INTRAMUSCULAR | Status: DC | PRN

## 2018-11-10 MED ORDER — SUCCINYLCHOLINE CHLORIDE 20 MG/ML IJ SOLN
INTRAMUSCULAR | Status: DC | PRN
  Administered 2018-11-10: 60 mg via INTRAVENOUS

## 2018-11-10 MED ORDER — METOCLOPRAMIDE HCL 10 MG PO TABS: 5.0000 mg | ORAL_TABLET | Freq: Three times a day (TID) | ORAL | Status: DC | PRN

## 2018-11-10 MED ORDER — KETAMINE HCL 50 MG/ML IJ SOLN
INTRAMUSCULAR | Status: DC | PRN
  Administered 2018-11-10 (×2): 25 mg via INTRAVENOUS

## 2018-11-10 MED ORDER — MAGNESIUM HYDROXIDE 400 MG/5ML PO SUSP: 30.0000 mL | Freq: Every day | ORAL | Status: DC | PRN

## 2018-11-10 MED ORDER — DEXAMETHASONE SODIUM PHOSPHATE 4 MG/ML IJ SOLN
INTRAMUSCULAR | Status: AC
  Filled 2018-11-10: qty 1

## 2018-11-10 MED ORDER — PHENYLEPHRINE HCL 10 MG/ML IJ SOLN
INTRAMUSCULAR | Status: DC | PRN
  Administered 2018-11-10 (×2): 100 ug via INTRAVENOUS

## 2018-11-10 MED ORDER — ONDANSETRON HCL 4 MG/2ML IJ SOLN
INTRAMUSCULAR | Status: DC | PRN
  Administered 2018-11-10: 4 mg via INTRAVENOUS

## 2018-11-10 MED ORDER — LACTATED RINGERS IV SOLN
INTRAVENOUS | Status: DC | PRN
  Administered 2018-11-10: 15:00:00 via INTRAVENOUS

## 2018-11-10 MED ORDER — ENOXAPARIN SODIUM 30 MG/0.3ML ~~LOC~~ SOLN: 30.0000 mg | SUBCUTANEOUS | Status: DC

## 2018-11-10 MED ORDER — OXYCODONE HCL 5 MG PO TABS
5.0000 mg | ORAL_TABLET | ORAL | Status: DC | PRN
  Administered 2018-11-12 – 2018-11-14 (×4): 5 mg via ORAL
  Administered 2018-11-15: 10 mg via ORAL
  Filled 2018-11-10 (×4): qty 1
  Filled 2018-11-10: qty 2

## 2018-11-10 MED ORDER — FENTANYL CITRATE (PF) 100 MCG/2ML IJ SOLN
INTRAMUSCULAR | Status: DC | PRN
  Administered 2018-11-10 (×2): 50 ug via INTRAVENOUS

## 2018-11-10 MED ORDER — CEFAZOLIN SODIUM 1 G IJ SOLR
INTRAMUSCULAR | Status: AC
  Filled 2018-11-10: qty 20

## 2018-11-10 MED ORDER — ONDANSETRON HCL 4 MG/2ML IJ SOLN: 4.0000 mg | Freq: Once | INTRAMUSCULAR | Status: DC | PRN

## 2018-11-10 MED ORDER — DIPHENHYDRAMINE HCL 12.5 MG/5ML PO ELIX: 12.5000 mg | ORAL_SOLUTION | ORAL | Status: DC | PRN

## 2018-11-10 MED ORDER — SODIUM CHLORIDE 0.9 % IV SOLN
INTRAVENOUS | Status: DC | PRN
  Administered 2018-11-10: 50 ug/min via INTRAVENOUS

## 2018-11-10 SURGICAL SUPPLY — 45 items
BIT DRILL 4.3MMS DISTAL GRDTED (BIT) ×1 IMPLANT
BNDG COHESIVE 4X5 TAN STRL (GAUZE/BANDAGES/DRESSINGS) ×3 IMPLANT
BNDG COHESIVE 6X5 TAN STRL LF (GAUZE/BANDAGES/DRESSINGS) ×3 IMPLANT
CANISTER SUCT 1200ML W/VALVE (MISCELLANEOUS) ×3 IMPLANT
CHLORAPREP W/TINT 26ML (MISCELLANEOUS) ×6 IMPLANT
COVER WAND RF STERILE (DRAPES) ×3 IMPLANT
DRAPE C-ARMOR (DRAPES) ×3 IMPLANT
DRAPE SHEET LG 3/4 BI-LAMINATE (DRAPES) ×3 IMPLANT
DRILL 4.3MMS DISTAL GRADUATED (BIT) ×3
DRSG OPSITE POSTOP 3X4 (GAUZE/BANDAGES/DRESSINGS) ×9 IMPLANT
DRSG OPSITE POSTOP 4X6 (GAUZE/BANDAGES/DRESSINGS) ×3 IMPLANT
ELECT CAUTERY BLADE 6.4 (BLADE) ×3 IMPLANT
ELECT REM PT RETURN 9FT ADLT (ELECTROSURGICAL) ×3
ELECTRODE REM PT RTRN 9FT ADLT (ELECTROSURGICAL) ×1 IMPLANT
GAUZE SPONGE 4X4 12PLY STRL (GAUZE/BANDAGES/DRESSINGS) ×3 IMPLANT
GLOVE BIO SURGEON STRL SZ8 (GLOVE) ×6 IMPLANT
GLOVE INDICATOR 8.0 STRL GRN (GLOVE) ×3 IMPLANT
GOWN STRL REUS W/ TWL LRG LVL3 (GOWN DISPOSABLE) ×1 IMPLANT
GOWN STRL REUS W/ TWL XL LVL3 (GOWN DISPOSABLE) ×1 IMPLANT
GOWN STRL REUS W/TWL LRG LVL3 (GOWN DISPOSABLE) ×2
GOWN STRL REUS W/TWL XL LVL3 (GOWN DISPOSABLE) ×2
GUIDEPIN VERSANAIL DSP 3.2X444 (ORTHOPEDIC DISPOSABLE SUPPLIES) ×3 IMPLANT
GUIDEWIRE BALL NOSE 80CM (WIRE) ×3 IMPLANT
HIP FRAC NAIL LAG SCR 10.5X100 (Orthopedic Implant) ×2 IMPLANT
HIP FRAC NAIL LEFT 11X360MM (Orthopedic Implant) ×3 IMPLANT
MAT ABSORB  FLUID 56X50 GRAY (MISCELLANEOUS) ×2
MAT ABSORB FLUID 56X50 GRAY (MISCELLANEOUS) ×1 IMPLANT
NAIL HIP FRAC LEFT 11X360MM (Orthopedic Implant) ×1 IMPLANT
NEEDLE FILTER BLUNT 18X 1/2SAF (NEEDLE) ×2
NEEDLE FILTER BLUNT 18X1 1/2 (NEEDLE) ×1 IMPLANT
NEEDLE HYPO 22GX1.5 SAFETY (NEEDLE) ×3 IMPLANT
NS IRRIG 500ML POUR BTL (IV SOLUTION) ×3 IMPLANT
PACK HIP COMPR (MISCELLANEOUS) ×3 IMPLANT
SCREW ANTI ROTATION 85MM (Screw) ×3 IMPLANT
SCREW BONE CORTICAL 5.0X44 (Screw) ×3 IMPLANT
SCREW CANN THRD AFF 10.5X100 (Orthopedic Implant) ×1 IMPLANT
SCREW DRILL BIT ANIT ROTATION (BIT) ×3 IMPLANT
STAPLER SKIN PROX 35W (STAPLE) ×3 IMPLANT
STRAP SAFETY 5IN WIDE (MISCELLANEOUS) ×3 IMPLANT
SUT VIC AB 0 CT1 36 (SUTURE) ×3 IMPLANT
SUT VIC AB 1 CT1 36 (SUTURE) ×3 IMPLANT
SUT VIC AB 2-0 CT1 (SUTURE) ×6 IMPLANT
SYR 10ML LL (SYRINGE) ×3 IMPLANT
SYR 30ML LL (SYRINGE) ×3 IMPLANT
TAPE MICROFOAM 4IN (TAPE) ×3 IMPLANT

## 2018-11-10 NOTE — Anesthesia Procedure Notes (Signed)
Procedure Name: Intubation Performed by: Rolla Plate, CRNA Pre-anesthesia Checklist: Patient identified, Patient being monitored, Timeout performed, Emergency Drugs available and Suction available Patient Re-evaluated:Patient Re-evaluated prior to induction Oxygen Delivery Method: Circle system utilized Preoxygenation: Pre-oxygenation with 100% oxygen Induction Type: IV induction Ventilation: Mask ventilation without difficulty Laryngoscope Size: Miller and 2 Grade View: Grade I Tube type: Oral Tube size: 7.0 mm Number of attempts: 1 Placement Confirmation: ETT inserted through vocal cords under direct vision,  positive ETCO2 and breath sounds checked- equal and bilateral Secured at: 21 cm Tube secured with: Tape Dental Injury: Teeth and Oropharynx as per pre-operative assessment

## 2018-11-10 NOTE — Transfer of Care (Signed)
Immediate Anesthesia Transfer of Care Note  Patient: Krista Ortiz  Procedure(s) Performed: INTRAMEDULLARY (IM) NAIL INTERTROCHANTRIC (Left Hip)  Patient Location: PACU  Anesthesia Type:General  Level of Consciousness: sedated  Airway & Oxygen Therapy: Patient Spontanous Breathing and Patient connected to face mask oxygen  Post-op Assessment: Report given to RN and Post -op Vital signs reviewed and stable  Post vital signs: Reviewed  Last Vitals:  Vitals Value Taken Time  BP 133/41 11/10/2018  4:24 PM  Temp    Pulse 70 11/10/2018  4:25 PM  Resp 11 11/10/2018  4:25 PM  SpO2 99 % 11/10/2018  4:25 PM  Vitals shown include unvalidated device data.  Last Pain:  Vitals:   11/10/18 1436  TempSrc: Tympanic  PainSc: 3       Patients Stated Pain Goal: 2 (24/23/53 6144)  Complications: No apparent anesthesia complications

## 2018-11-10 NOTE — Progress Notes (Signed)
[facility] NAME: Krista Ortiz    MR#:  [id number]  DATE OF BIRTH:  September 08, 1923  SUBJECTIVE:  CHIEF COMPLAINT:   Chief Complaint  Patient presents with  . Fall  . Hip Pain   She came with a fall and left hip fracture.  Still have complaint of pain in the left hip.  Waiting for surgery.  REVIEW OF SYSTEMS:  CONSTITUTIONAL: No fever, fatigue or weakness.  EYES: No blurred or double vision.  EARS, NOSE, AND THROAT: No tinnitus or ear pain.  RESPIRATORY: No cough, shortness of breath, wheezing or hemoptysis.  CARDIOVASCULAR: No chest pain, orthopnea, edema.  GASTROINTESTINAL: No nausea, vomiting, diarrhea or abdominal pain.  GENITOURINARY: No dysuria, hematuria.  ENDOCRINE: No polyuria, nocturia,  HEMATOLOGY: No anemia, easy bruising or bleeding SKIN: No rash or lesion. MUSCULOSKELETAL: Left hip joint pain due to fracture.   NEUROLOGIC: No tingling, numbness, weakness.  PSYCHIATRY: No anxiety or depression.   ROS  DRUG ALLERGIES:   Allergies  Allergen Reactions  . Citalopram Other (See Comments)    Reaction:  Fatigue   . Pneumococcal Vaccine Other (See Comments)    Doesn't remember what occurred.  . Pneumovax [Pneumococcal Polysaccharide Vaccine] Other (See Comments)    Reaction:  Unknown   . Requip [Ropinirole Hcl] Other (See Comments)    Reaction:  Dry mouth     VITALS:  Blood pressure (!) 152/66, pulse 76, temperature 97.9 F (36.6 C), temperature source Oral, resp. rate 20, height 5' (1.524 m), weight 62.1 kg, SpO2 95 %.  PHYSICAL EXAMINATION:  GENERAL:  85 y.o.-year-old patient lying in the bed with no acute distress.  EYES: Pupils equal, round, reactive to light and accommodation. No scleral icterus. Extraocular muscles intact.  HEENT: Head atraumatic, normocephalic. Oropharynx and nasopharynx clear.  NECK:  Supple, no jugular venous distention. No thyroid enlargement, no tenderness.  LUNGS: Normal breath  sounds bilaterally, no wheezing, rales,rhonchi or crepitation. No use of accessory muscles of respiration.  CARDIOVASCULAR: S1, S2 normal. No murmurs, rubs, or gallops.  ABDOMEN: Soft, nontender, nondistended. Bowel sounds present. No organomegaly or mass.  EXTREMITIES: No pedal edema, cyanosis, or clubbing.  NEUROLOGIC: Cranial nerves II through XII are intact. Muscle strength 4/5 in all extremities except for left hip where she is not moving much due to pain. Sensation intact. Gait not checked.  PSYCHIATRIC: The patient is alert and oriented x 3.  SKIN: No obvious rash, lesion, or ulcer.   Physical Exam LABORATORY PANEL:   CBC Recent Labs  Lab 11/10/18 0455  WBC 4.1  HGB 12.8  HCT 38.9  PLT 319   ------------------------------------------------------------------------------------------------------------------  Chemistries  Recent Labs  Lab 11/09/18 1710 11/10/18 0455  NA 139 135  K 4.1 3.8  CL 101 99  CO2 27 28  GLUCOSE 130* 120*  BUN 15 14  CREATININE 1.17* 1.03*  CALCIUM 8.6* 8.8*  AST 21  --   ALT 16  --   ALKPHOS 67  --   BILITOT 0.3  --    ------------------------------------------------------------------------------------------------------------------  Cardiac Enzymes No results for input(s): TROPONINI in the last 168 hours. ------------------------------------------------------------------------------------------------------------------  RADIOLOGY:  Dg Chest 1 View  Result Date: 11/09/2018 CLINICAL DATA:  Pre operative respiratory exam.  Left hip fracture. EXAM: CHEST  1 VIEW COMPARISON:  Chest x-ray dated 02/15/2017 FINDINGS: Heart size and vascularity are normal. Tortuosity and calcification of the thoracic aorta. Pacemaker and Port-A-Cath in place, unchanged. Tip of the Port-A-Cath is just  above the cavoatrial junction. Lungs are clear.  No acute bone abnormality. IMPRESSION: No acute abnormalities. Aortic Atherosclerosis (ICD10-I70.0). Electronically  Signed   By: Lorriane Shire M.D.   On: 11/09/2018 16:52   Dg Ankle Complete Left  Result Date: 11/09/2018 CLINICAL DATA:  Trauma secondary to a fall today. Skin tear on the lower leg. Hip fracture. EXAM: LEFT ANKLE COMPLETE - 3+ VIEW COMPARISON:  None. FINDINGS: There is no fracture or dislocation or joint effusion. Small plantar calcaneal enthesophyte. Soft tissue swelling circumferentially around the ankle, most prominent anteriorly. IMPRESSION: No acute bone abnormality.  Soft tissue swelling. Electronically Signed   By: Lorriane Shire M.D.   On: 11/09/2018 16:50   Ct Head Wo Contrast  Result Date: 11/09/2018 CLINICAL DATA:  85 y/o  F; EXAM: CT HEAD WITHOUT CONTRAST TECHNIQUE: Contiguous axial images were obtained from the base of the skull through the vertex without intravenous contrast. COMPARISON:  01/27/2017 CT head. 03/07/2010 MRI head. FINDINGS: Brain: Stable left CP angle mass compatible with meningioma measuring up to 2 cm.No additional evidence of acute infarction, hemorrhage, hydrocephalus, extra-axial collection or mass lesion/mass effect. Nonspecific white matter hypodensities are compatible with chronic microvascular ischemic changes and there is volume loss of the brain which is stable from prior CT. Stable small chronic infarction within the left occipital lobe. Vascular: Calcific atherosclerosis of the carotid siphons and the vertebral arteries. No hyperdense vessel. Skull: Focus of right lateral scalp soft tissue thickening possibly a contusion. No calvarial fracture. Sinuses/Orbits: Mild maxillary and ethmoid sinus mucosal thickening. Aerosolized secretions in the left sphenoid sinus. Chronic inflammatory changes of the walls of the sphenoid sinuses. Partial opacification of right mastoid air cells. Bilateral intra-ocular lens replacement. Other: None. IMPRESSION: 1. No acute intracranial abnormality identified. 2. Stable left cerebellopontine angle meningioma. 3. Stable chronic  microvascular ischemic changes and parenchymal volume loss of the brain. 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 Electronically Signed   By: Kristine Garbe M.D.   On: 11/09/2018 16:56   Dg Hip Unilat W Or Wo Pelvis 2-3 Views Left  Result Date: 11/09/2018 CLINICAL DATA:  85 y/o  F; fall with left hip pain. EXAM: DG HIP (WITH OR WITHOUT PELVIS) 2-3V LEFT COMPARISON:  11/30/2015 pelvis and left hip radiographs. FINDINGS: Acute minimally displaced left femur intertrochanteric fracture with coxa vara angulation. No pelvic fracture or diastasis identified. No dislocation of the hip joints. IMPRESSION: Acute minimally displaced left femur intertrochanteric fracture with coxa vara angulation. Electronically Signed   By: Kristine Garbe M.D.   On: 11/09/2018 16:49    ASSESSMENT AND PLAN:   Active Problems:   Hip fracture (HCC)  *Acute left close intertrochanteric hip fracture Secondary to mechanical fall  Dr. Poggi/orthopedic surgery notified in the emergency room by ED attending, adult pain protocol, waiting for surgery today. Patient has history of COPD and pacemaker placement due to SA nodal dysfunction.  Have carotid artery stenosis but follows with vascular surgery as outpatient.  Currently patient is optimized and okay to proceed with surgery for the hip fracture with moderate risk related to her age and medical issues.  *COPD without exacerbation Breathing treatments PRN  *Chronic hyperlipidemia, unspecified Continue statin therapy  *History of TIA Continue statin therapy, hold Plavix  *Abnormal CT of the head Noted for meningioma Can follow-up as an outpatient with neurosurgery if patient wishes Given advanced age would consider conservative therapy/monitoring  *Status post pacemaker placement due to SA nodal dysfunction Follows with cardiologist as outpatient, continue  to monitor.  All the records are reviewed and case discussed with Care  Management/Social Workerr. Management plans discussed with the patient, family and they are in agreement.  CODE STATUS: DNR.  TOTAL TIME TAKING CARE OF THIS PATIENT: 35 minutes.   Discussed with patient and her son in the room.  POSSIBLE D/C IN 1-2 DAYS, DEPENDING ON CLINICAL CONDITION.   Vaughan Basta M.D on 11/10/2018   Between 7am to 6pm - Pager - [phone]  After 6pm go to [email or web address] - password [email or web address] [facility]  Office  [phone]  CC: Primary care physician; Hortencia Pilar, MD  Note: This dictation was prepared with Dragon dictation along with smaller phrase technology. Any transcriptional errors that result from this process are unintentional.

## 2018-11-10 NOTE — Care Management (Signed)
Met with patient and sons, The patient will need to go to Rehab for a couple of weeks, they do not want to use [facility], want one close to [place]. Would like [facility] if possible

## 2018-11-10 NOTE — Progress Notes (Signed)
Anticoagulation monitoring(Lovenox):   85 yo female ordered Lovenox 40 mg Q24h  Filed Weights   11/09/18 1557 11/10/18 0500  Weight: 130 lb (59 kg) 137 lb (62.1 kg)   BMI    Lab Results  Component Value Date   CREATININE 1.03 (H) 11/10/2018   CREATININE 1.17 (H) 11/09/2018   CREATININE 1.00 07/14/2018   Estimated Creatinine Clearance: 28.1 mL/min (A) (by C-G formula based on SCr of 1.03 mg/dL (H)). Hemoglobin & Hematocrit     Component Value Date/Time   HGB 12.8 11/10/2018 0455   HGB 13.4 01/04/2015 1351   HCT 38.9 11/10/2018 0455   HCT 41.3 01/04/2015 1351     Per Protocol for Patient with estCrcl < 30 ml/min and BMI < 40, will transition to Lovenox 30 mg Q24h.

## 2018-11-10 NOTE — Progress Notes (Signed)
Bladder scan done greater than 480mL. Dr Roland Rack aware. Orders placed for foley catheter.

## 2018-11-10 NOTE — Op Note (Signed)
11/10/2018  4:35 PM  Patient:   Krista Ortiz  Pre-Op Diagnosis:   Displaced 2-part intertrochanteric/basicervical neck fracture left hip.  Post-Op Diagnosis:   Same  Procedure:   Reduction and internal fixation of displaced intertrochanteric/basicervical left hip fracture with Biomet Affixis TFN nail.  Surgeon:   Pascal Lux, MD  Assistant:   Orland Penman, PA-S  Anesthesia:   GET  Findings:   As above  Complications:   None  EBL:   400 cc  Fluids:   50 cc crystalloid  UOP:   Incontinent  TT:   None  Drains:   None  Closure:   Staples  Implants:   Biomet Affixis 11 x 360 mm TFN with a 100 mm lag screw, and 85 mm derotation screw, and a 44 mm distal interlocking screw  Brief Clinical Note:   The patient is a 85 year old female who sustained the above-noted injury when she apparently lost her balance while trying to sit down and fell onto the floor, injuring her left hip. She was brought to the emergency room where x-rays demonstrated the above-noted injury. The patient has been cleared medically and presents at this time for reduction and internal fixation of the left hip fracture.  Procedure:   The patient was brought into the operating room and lain in the supine position. After undergoing general endotracheal intubation and anesthesia, the patient was repositioned on the fracture table. The uninjured leg was placed in a flexed and abducted position while the injured lower extremity was placed in longitudinal traction. The fracture was reduced using longitudinal traction and internal rotation. The adequacy of reduction was verified fluoroscopically in AP and lateral projections and found to be near anatomic. The lateral aspects of the left hip and thigh were prepped with ChloraPrep solution before being draped sterilely. Preoperative antibiotics were administered. A timeout was performed to verify the appropriate surgical site. The greater trochanter was identified  fluoroscopically and an approximately 3 cm incision made about 2-3 fingerbreadths above the tip of the greater trochanter. The incision was carried down through the subcutaneous tissues to expose the gluteal fascia. This was split the length of the incision, providing access to the tip of the trochanter. Under fluoroscopic guidance, a guidewire was drilled through the tip of the trochanter into the proximal metaphysis to the level of the lesser trochanter. After verifying its position fluoroscopically in AP and lateral projections, it was overreamed with the initial reamer to the depth of the lesser trochanter. A guidewire was passed down through the femoral canal to the supracondylar region. The adequacy of guidewire position was verified fluoroscopically in AP and lateral projections before the length of the guidewire within the canal was measured and found to be 365 mm. Therefore, a 360 mm length nail was selected. The guidewire was overreamed sequentially using the flexible reamers, beginning with a 9 mm reamer and progressing to a 12.5 mm reamer. This provided good cortical chatter. The 11 x 360 mm Biomet Affixis TFN rod was selected and advanced to the appropriate depth, as verified fluoroscopically.   The guide system for the lag screw was positioned and advanced through an approximately 2 cm stab incision over the lateral aspect of the proximal femur. The guidewire was drilled up through the trochanteric femoral nail and into the femoral neck to rest within 5 mm of subchondral bone. After verifying its position in the femoral neck and head in both AP and lateral projections, the guidewire was measured and found to  be optimally replicated by a 503 mm lag screw. The guidewire was overreamed to the appropriate depth before the lag screw was inserted and advanced to the appropriate depth as verified fluoroscopically in AP and lateral projections. The locking screw was advanced, then backed off a quarter turn  to set the lag screw. In addition, an 85 mm derotation screw was inserted given that the fracture was in a more basicervical versus true intertrochanteric location. Again the adequacy of hardware position and fracture reduction was verified fluoroscopically in AP and lateral projections and found to be excellent.  Attention was directed distally. Using the "perfect circle" technique, the leg and fluoroscopy machine were positioned appropriately. An approximately 1.5 cm stab incision was made over the skin at the appropriate point before the drill bit was advanced through the cortex and across the static hole of the nail. The appropriate length of the screw was determined before the 44 mm distal interlocking screw was positioned, then advanced and tightened securely. Again the adequacy of screw position was verified fluoroscopically in AP and lateral projections and found to be excellent.  The wounds were irrigated thoroughly with sterile saline solution before the abductor fascia was reapproximated using #0 Vicryl interrupted sutures. The subcutaneous tissues were closed using 2-0 Vicryl interrupted sutures. The skin was closed using staples. A total of 30 cc of 0.5% Sensorcaine with epinephrine was injected in and around all incisions. Sterile occlusive dressings were applied to all wounds before the patient was transferred back to his/her hospital bed. The patient was then awakened, extubated, transferred to the recovery room in satisfactory condition after tolerating the procedure well.

## 2018-11-10 NOTE — Anesthesia Preprocedure Evaluation (Signed)
Anesthesia Evaluation  Patient identified by MRN, date of birth, ID band Patient awake    Reviewed: Allergy & Precautions, NPO status , Patient's Chart, lab work & pertinent test results  History of Anesthesia Complications Negative for: history of anesthetic complications  Airway Mallampati: II  TM Distance: >3 FB Neck ROM: Full    Dental no notable dental hx. (+) Upper Dentures, Lower Dentures   Pulmonary COPD,  COPD inhaler and oxygen dependent, Current Smoker,  o2 at night   Pulmonary exam normal        Cardiovascular Exercise Tolerance: Poor + Peripheral Vascular Disease  Normal cardiovascular exam+ dysrhythmias + Valvular Problems/Murmurs      Neuro/Psych TIA Neuromuscular disease negative psych ROS   GI/Hepatic negative GI ROS, Neg liver ROS,   Endo/Other  negative endocrine ROS  Renal/GU negative Renal ROS  negative genitourinary   Musculoskeletal negative musculoskeletal ROS (+)   Abdominal   Peds negative pediatric ROS (+)  Hematology negative hematology ROS (+)   Anesthesia Other Findings   Reproductive/Obstetrics negative OB ROS                             Anesthesia Physical  Anesthesia Plan  ASA: III  Anesthesia Plan: General   Post-op Pain Management:    Induction: Intravenous  PONV Risk Score and Plan:   Airway Management Planned: Oral ETT  Additional Equipment:   Intra-op Plan:   Post-operative Plan: Extubation in OR  Informed Consent: I have reviewed the patients History and Physical, chart, labs and discussed the procedure including the risks, benefits and alternatives for the proposed anesthesia with the patient or authorized representative who has indicated his/her understanding and acceptance.       Plan Discussed with: Surgeon and CRNA  Anesthesia Plan Comments:         Anesthesia Quick Evaluation

## 2018-11-10 NOTE — NC FL2 (Signed)
[place] LEVEL OF CARE SCREENING TOOL     IDENTIFICATION  Patient Name: Krista Ortiz Birthdate: 1923/12/26 Sex: female Admission Date (Current Location): 11/09/2018  [facility] and [place] Number:  Selena Lesser ([id number]) Facility and Address:  [facility], [address] [place], [place] [address]      Provider Number: [id number]  Attending Physician Name and Address:  Vaughan Basta, *  Relative Name and Phone Number:       Current Level of Care: Hospital Recommended Level of Care: [place] Prior Approval Number:    Date Approved/Denied:   PASRR Number: ([id number])  Discharge Plan: SNF    Current Diagnoses: Patient Active Problem List   Diagnosis Date Noted  . Hip fracture ([place]) 11/09/2018  . COPD (chronic obstructive pulmonary disease) ([place]) 02/16/2017  . COPD exacerbation ([place]) 02/15/2017  . Polycythemia 04/02/2016  . Back ache 10/03/2015  . Chronic obstructive pulmonary disease ([place]) 10/03/2015  . Dizziness 10/03/2015  . Decrease in the ability to hear 10/03/2015  . Neoplasm of meninges 10/03/2015  . Combined fat and carbohydrate induced hyperlipemia 10/03/2015  . Neuroma 10/03/2015  . Impaired vision 10/03/2015  . Presence of other vascular implants and grafts 10/03/2015  . Current smoker 10/03/2015  . Temporary cerebral vascular dysfunction 10/03/2015  . Abnormal CBC 06/12/2015  . Chronic LBP 06/12/2015  . Polycythemia vera ([place]) 04/26/2015  . Hypersensitivity to pneumococcal vaccine 02/21/2015  . Carotid artery narrowing 02/19/2015  . Sick sinus syndrome ([place]) 02/16/2015  . Syncope and collapse 02/15/2015  . Edema leg 10/25/2014  . TI (tricuspid incompetence) 10/25/2014  . Breathlessness on exertion 10/25/2014  . Chest pain at rest 10/03/2014  . Dyspnea, paroxysmal nocturnal 10/03/2014  . Amnesia 04/02/2014  . Abnormal weight loss 04/02/2014  . Osteopenia 03/15/2014  . Chronic  pain 07/06/2013  . Peripheral vascular disease ([place]) 06/27/2013  . Ejection murmur 12/09/2012    Orientation RESPIRATION BLADDER Height & Weight     Self, Time, Situation, Place  Normal Continent Weight: 137 lb (62.1 kg) Height:  5' (152.4 cm)  BEHAVIORAL SYMPTOMS/MOOD NEUROLOGICAL BOWEL NUTRITION STATUS      Continent Diet(Diet: NPO for surgery to be advanced. )  AMBULATORY STATUS COMMUNICATION OF NEEDS Skin   Extensive Assist Verbally Surgical wounds(Incision: Left Hip. )                       Personal Care Assistance Level of Assistance  Bathing, Feeding, Dressing Bathing Assistance: Limited assistance Feeding assistance: Independent Dressing Assistance: Limited assistance     Functional Limitations Info  Sight, Hearing, Speech Sight Info: Adequate Hearing Info: Adequate Speech Info: Adequate    SPECIAL CARE FACTORS FREQUENCY  PT (By licensed PT), OT (By licensed OT)     PT Frequency: (5) OT Frequency: (5)            Contractures      Additional Factors Info  Code Status, Allergies  Isolation for MRSA Nasal Swab.  Code Status Info: (DNR ) Allergies Info: (Citalopram, Pneumococcal Vaccine, Pneumovax Pneumococcal Polysaccharide Vaccine, Requip Ropinirole Hcl)           Current Medications (11/10/2018):  This is the current hospital active medication list Current Facility-Administered Medications  Medication Dose Route Frequency Provider Last Rate Last Dose  . [MAR Hold] acetaminophen (TYLENOL) tablet 650 mg  650 mg Oral Q6H PRN Salary, Montell D, MD      . Doug Sou Hold] albuterol (PROVENTIL) (2.5 MG/3ML) 0.083% nebulizer solution  3 mL  3 mL Inhalation Q6H PRN Salary, Avel Peace, MD      . Doug Sou Hold] atorvastatin (LIPITOR) tablet 10 mg  10 mg Oral Daily Salary, Montell D, MD      . Doug Sou Hold] bisacodyl (DULCOLAX) suppository 10 mg  10 mg Rectal Daily PRN Salary, Avel Peace, MD      . Doug Sou Hold] Chlorhexidine Gluconate Cloth 2 % PADS 6 each  6 each Topical  Q0600 Poggi, Marshall Cork, MD   6 each at 11/10/18 0700  . clindamycin (CLEOCIN) 600 MG/50ML IVPB           . clindamycin (CLEOCIN) IVPB 600 mg  600 mg Intravenous Once Poggi, Marshall Cork, MD      . dextrose 5 %-0.45 % sodium chloride infusion   Intravenous Continuous Salary, Montell D, MD 100 mL/hr at 11/09/18 2029    . [MAR Hold] docusate sodium (COLACE) capsule 100 mg  100 mg Oral BID Loney Hering D, MD   100 mg at 11/09/18 2016  . fentaNYL (SUBLIMAZE) injection 25 mcg  25 mcg Intravenous Q5 min PRN Alvin Critchley, MD      . Doug Sou Hold] furosemide (LASIX) tablet 20 mg  20 mg Oral BID Loney Hering D, MD   20 mg at 11/09/18 2019  . [MAR Hold] HYDROcodone-acetaminophen (NORCO/VICODIN) 5-325 MG per tablet 1-2 tablet  1-2 tablet Oral Q4H PRN Gorden Harms, MD   1 tablet at 11/10/18 0929  . [MAR Hold] hydroxyurea (HYDREA) capsule 500 mg  500 mg Oral Once per day on Mon Wed Fri Salary, Holly Bodily D, MD      . Doug Sou Hold] mometasone-formoterol [facility]) 200-5 MCG/ACT inhaler 2 puff  2 puff Inhalation BID Loney Hering D, MD   2 puff at 11/09/18 2017  . [MAR Hold] morphine 2 MG/ML injection 2 mg  2 mg Intravenous Q2H PRN Salary, Montell D, MD   2 mg at 11/10/18 1208  . [MAR Hold] mupirocin ointment (BACTROBAN) 2 % 1 application  1 application Nasal BID Poggi, Marshall Cork, MD   1 application at 27/78/24 [phone]  . [MAR Hold] ondansetron (ZOFRAN) tablet 4 mg  4 mg Oral Q6H PRN Salary, Montell D, MD       Or  . Doug Sou Hold] ondansetron (ZOFRAN) injection 4 mg  4 mg Intravenous Q6H PRN Salary, Montell D, MD      . ondansetron (ZOFRAN) injection 4 mg  4 mg Intravenous Once PRN Alvin Critchley, MD      . Doug Sou Hold] potassium chloride (K-DUR,KLOR-CON) CR tablet 10 mEq  10 mEq Oral Daily Salary, Avel Peace, MD       Facility-Administered Medications Ordered in Other Encounters  Medication Dose Route Frequency Provider Last Rate Last Dose  . sodium chloride flush (NS) 0.9 % injection 10 mL  10 mL Intravenous PRN Evlyn Kanner, NP   10 mL at 10/03/15 6144     Discharge Medications: Please see discharge summary for a list of discharge medications.  Relevant Imaging Results:  Relevant Lab Results:   Additional Information (SSN: [id number])  Zekiel Torian, Veronia Beets, LCSW

## 2018-11-10 NOTE — Consult Note (Addendum)
ORTHOPAEDIC CONSULTATION  REQUESTING PHYSICIAN: Vaughan Basta, *  Chief Complaint:   Left hip pain.  History of Present Illness: Krista Ortiz is a 85 y.o. female with a history of COPD, hyperlipidemia, peripheral vascular disease and osteoporosis who lives independently apparently lost her balance and fell while trying to sit down at her home last night, landing on her left side.  She was unable to get up.  The EMS was called and she was brought to the emergency room where x-rays demonstrated a mildly displaced intertrochanteric fracture of her left hip.  The patient denies any associated injuries.  She did not strike her head or lose consciousness.  In addition, she denies any lightheadedness, dizziness, chest pain, shortness of breath, or other symptoms which may have precipitated her fall.  Past Medical History:  Diagnosis Date  . Acoustic neuroma [facility])    md notes says history of acoustic neuroma or meningioma followed up by yearly MRI  . Chronic back pain   . COPD (chronic obstructive pulmonary disease) ([place])   . Hyperlipidemia   . Knee joint replacement status, right 11/25/2016  . Polycythemia   . Polycythemia, secondary 04/26/2015  . Port-A-Cath in place   . PVD (peripheral vascular disease) ([place])   . Right wrist fracture 11/25/2016  . Thoracic compression fracture, closed, initial encounter ([place]) 11/25/2016  . TIA (transient ischemic attack)    Past Surgical History:  Procedure Laterality Date  . ABDOMINAL HYSTERECTOMY    . APPENDECTOMY    . BACK SURGERY    . cataracts    . CHOLECYSTECTOMY    . PACEMAKER INSERTION Left 02/18/2015   Procedure: INSERTION PACEMAKER;  Surgeon: Isaias Cowman, MD;  Location: [facility] ORS;  Service: Cardiovascular;  Laterality: Left;  . REPLACEMENT TOTAL KNEE    . TONSILLECTOMY     Social History   Socioeconomic History  . Marital status: Widowed    Spouse name:  Not on file  . Number of children: Not on file  . Years of education: Not on file  . Highest education level: Not on file  Occupational History  . Not on file  Social Needs  . Financial resource strain: Not on file  . Food insecurity:    Worry: Not on file    Inability: Not on file  . Transportation needs:    Medical: Not on file    Non-medical: Not on file  Tobacco Use  . Smoking status: Current Every Day Smoker    Packs/day: 0.50    Types: Cigarettes  . Smokeless tobacco: Never Used  Substance and Sexual Activity  . Alcohol use: No  . Drug use: No  . Sexual activity: Not Currently  Lifestyle  . Physical activity:    Days per week: Not on file    Minutes per session: Not on file  . Stress: Not on file  Relationships  . Social connections:    Talks on phone: Not on file    Gets together: Not on file    Attends religious service: Not on file    Active member of club or organization: Not on file    Attends meetings of clubs or organizations: Not on file    Relationship status: Not on file  Other Topics Concern  . Not on file  Social History Narrative  . Not on file   Family History  Problem Relation Age of Onset  . Other Mother        Old age  . Other Father  Old age  . CVA Sister   . Heart attack Brother    Allergies  Allergen Reactions  . Citalopram Other (See Comments)    Reaction:  Fatigue   . Pneumococcal Vaccine Other (See Comments)    Doesn't remember what occurred.  . Pneumovax [Pneumococcal Polysaccharide Vaccine] Other (See Comments)    Reaction:  Unknown   . Requip [Ropinirole Hcl] Other (See Comments)    Reaction:  Dry mouth    Prior to Admission medications   Medication Sig Start Date End Date Taking? Authorizing Provider  Vitamin D, Ergocalciferol, (DRISDOL) 1.25 MG (50000 UT) CAPS capsule Take 50,000 Units by mouth once a week. 08/15/18  Yes [provider]  acetaminophen (TYLENOL) 325 MG tablet Take 650 mg by mouth every 6  (six) hours as needed.    [provider]  albuterol (PROVENTIL) (2.5 MG/3ML) 0.083% nebulizer solution Inhale 3 mLs into the lungs every 6 (six) hours as needed. 12/23/15   [provider]  atorvastatin (LIPITOR) 10 MG tablet Take 10 mg by mouth daily.    [provider]  budesonide-formoterol (SYMBICORT) 160-4.5 MCG/ACT inhaler Inhale 2 Inhalers 2 (two) times daily into the lungs. 02/23/17 07/14/18  [provider]  CIPRODEX OTIC suspension Place 4 drops into both ears 2 (two) times daily. 08/10/18   [provider]  clopidogrel (PLAVIX) 75 MG tablet Take 75 mg by mouth daily.    [provider]  furosemide (LASIX) 20 MG tablet Take 20 mg by mouth 2 (two) times daily.     [provider]  hydroxyurea (HYDREA) 500 MG capsule Take 500 mg by mouth 3 (three) times a week. Pt takes on Monday, Wednesday, and Friday.    [provider]  potassium chloride (K-DUR) 10 MEQ tablet Take 10 mEq by mouth daily.    [provider]   Dg Chest 1 View  Result Date: 11/09/2018 CLINICAL DATA:  Pre operative respiratory exam.  Left hip fracture. EXAM: CHEST  1 VIEW COMPARISON:  Chest x-ray dated 02/15/2017 FINDINGS: Heart size and vascularity are normal. Tortuosity and calcification of the thoracic aorta. Pacemaker and Port-A-Cath in place, unchanged. Tip of the Port-A-Cath is just above the cavoatrial junction. Lungs are clear.  No acute bone abnormality. IMPRESSION: No acute abnormalities. Aortic Atherosclerosis (ICD10-I70.0). Electronically Signed   By: Lorriane Shire M.D.   On: 11/09/2018 16:52   Dg Ankle Complete Left  Result Date: 11/09/2018 CLINICAL DATA:  Trauma secondary to a fall today. Skin tear on the lower leg. Hip fracture. EXAM: LEFT ANKLE COMPLETE - 3+ VIEW COMPARISON:  None. FINDINGS: There is no fracture or dislocation or joint effusion. Small plantar calcaneal enthesophyte. Soft tissue swelling circumferentially around the  ankle, most prominent anteriorly. IMPRESSION: No acute bone abnormality.  Soft tissue swelling. Electronically Signed   By: Lorriane Shire M.D.   On: 11/09/2018 16:50   Ct Head Wo Contrast  Result Date: 11/09/2018 CLINICAL DATA:  85 y/o  F; EXAM: CT HEAD WITHOUT CONTRAST TECHNIQUE: Contiguous axial images were obtained from the base of the skull through the vertex without intravenous contrast. COMPARISON:  01/27/2017 CT head. 03/07/2010 MRI head. FINDINGS: Brain: Stable left CP angle mass compatible with meningioma measuring up to 2 cm.No additional evidence of acute infarction, hemorrhage, hydrocephalus, extra-axial collection or mass lesion/mass effect. Nonspecific white matter hypodensities are compatible with chronic microvascular ischemic changes and there is volume loss of the brain which is stable from prior CT. Stable small chronic infarction within  the left occipital lobe. Vascular: Calcific atherosclerosis of the carotid siphons and the vertebral arteries. No hyperdense vessel. Skull: Focus of right lateral scalp soft tissue thickening possibly a contusion. No calvarial fracture. Sinuses/Orbits: Mild maxillary and ethmoid sinus mucosal thickening. Aerosolized secretions in the left sphenoid sinus. Chronic inflammatory changes of the walls of the sphenoid sinuses. Partial opacification of right mastoid air cells. Bilateral intra-ocular lens replacement. Other: None. IMPRESSION: 1. No acute intracranial abnormality identified. 2. Stable left cerebellopontine angle meningioma. 3. Stable chronic microvascular ischemic changes and parenchymal volume loss of the brain. 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 Electronically Signed   By: Kristine Garbe M.D.   On: 11/09/2018 16:56   Dg Hip Unilat W Or Wo Pelvis 2-3 Views Left  Result Date: 11/09/2018 CLINICAL DATA:  85 y/o  F; fall with left hip pain. EXAM: DG HIP (WITH OR WITHOUT PELVIS) 2-3V LEFT COMPARISON:  11/30/2015 pelvis and  left hip radiographs. FINDINGS: Acute minimally displaced left femur intertrochanteric fracture with coxa vara angulation. No pelvic fracture or diastasis identified. No dislocation of the hip joints. IMPRESSION: Acute minimally displaced left femur intertrochanteric fracture with coxa vara angulation. Electronically Signed   By: Kristine Garbe M.D.   On: 11/09/2018 16:49    Positive ROS: All other systems have been reviewed and were otherwise negative with the exception of those mentioned in the HPI and as above.  Physical Exam: General:  Alert, no acute distress Psychiatric:  Patient is competent for consent with normal mood and affect   Cardiovascular:  No pedal edema Respiratory:  No wheezing, non-labored breathing GI:  Abdomen is soft and non-tender Skin:  No lesions in the area of chief complaint Neurologic:  Sensation intact distally Lymphatic:  No axillary or cervical lymphadenopathy  Orthopedic Exam:  Orthopedic examination is limited to the left hip and lower extremity.  The left lower extremity is somewhat shortened and externally rotated as compared to the right leg.  Skin inspection around the left hip is unremarkable.  There is no swelling, erythema, ecchymosis, abrasions, or other skin abnormalities identified.  She has mild tenderness to palpation over the lateral aspect of the hip.  She has more severe pain with any attempted active or passive motion of the hip.  She is able actively dorsiflex and plantarflex her toes and ankle.  Sensation is intact to light touch to all distributions.  She has good capillary refill to her left foot.  X-rays:  X-rays of the pelvis and left hip are available for review and have been reviewed by myself.  These films demonstrate a displaced intertrochanteric/basicervical fracture of the left hip.  No significant degenerative changes of the left hip joint are identified.  No lytic lesions or other acute bony abnormalities are  observed.  Assessment: Displaced intertrochanteric fracture left hip.  Plan: The treatment options have been discussed with the patient, including both surgical and nonsurgical choices.  The patient would like to proceed with surgical intervention to include a reduction and internal fixation of the left hip fracture using a trochanteric femoral nail.  The procedure has been discussed in detail with the patient, as have the potential risks (including bleeding, infection, nerve and/or blood vessel injury, persistent or recurrent pain, stiffness, malunion and/or nonunion, leg length inequality, need for further surgery, blood clots, strokes, heart attacks and arrhythmias, etc.) and benefits.  The patient states her understanding and wishes to proceed.  A formal written consent will be obtained by the nursing staff.  We will try to do this later today.  Thank you for asking me to participate in the care of this most pleasant woman.  I will be happy to follow her with you.   Pascal Lux, MD  Beeper #:  [phone]  11/10/2018 9:56 AM

## 2018-11-10 NOTE — Anesthesia Post-op Follow-up Note (Signed)
Anesthesia QCDR form completed.        

## 2018-11-11 ENCOUNTER — Inpatient Hospital Stay: Payer: Medicare Other

## 2018-11-11 ENCOUNTER — Encounter: Payer: Self-pay | Admitting: Surgery

## 2018-11-11 LAB — BASIC METABOLIC PANEL
Anion gap: 7 (ref 5–15)
BUN: 13 mg/dL (ref 8–23)
CO2: 27 mmol/L (ref 22–32)
Calcium: 7.9 mg/dL — ABNORMAL LOW (ref 8.9–10.3)
Chloride: 100 mmol/L (ref 98–111)
Creatinine, Ser: 0.83 mg/dL (ref 0.44–1.00)
GFR calc Af Amer: >60 mL/min (ref >60)
GLUCOSE: 129 mg/dL — AB (ref 70–99)
Potassium: 3.9 mmol/L (ref 3.5–5.1)
Sodium: 134 mmol/L — ABNORMAL LOW (ref 135–145)

## 2018-11-11 LAB — CBC WITH DIFFERENTIAL/PLATELET
Abs Immature Granulocytes: 0.03 10*3/uL (ref 0.00–0.07)
BASOS ABS: 0 10*3/uL (ref 0.0–0.1)
Basophils Relative: 0 %
Eosinophils Absolute: 0 10*3/uL (ref 0.0–0.5)
Eosinophils Relative: 0 %
HEMATOCRIT: 34.3 % — AB (ref 36.0–46.0)
Hemoglobin: 11.4 g/dL — ABNORMAL LOW (ref 12.0–15.0)
Immature Granulocytes: 1 %
LYMPHS ABS: 0.5 10*3/uL — AB (ref 0.7–4.0)
Lymphocytes Relative: 12 %
MCH: 36.7 pg — ABNORMAL HIGH (ref 26.0–34.0)
MCHC: 33.2 g/dL (ref 30.0–36.0)
MCV: 110.3 fL — ABNORMAL HIGH (ref 80.0–100.0)
Monocytes Absolute: 0.5 10*3/uL (ref 0.1–1.0)
Monocytes Relative: 11 %
Neutro Abs: 3.2 10*3/uL (ref 1.7–7.7)
Neutrophils Relative %: 76 %
PLATELETS: 250 10*3/uL (ref 150–400)
RBC: 3.11 MIL/uL — ABNORMAL LOW (ref 3.87–5.11)
RDW: 13.1 % (ref 11.5–15.5)
Smear Review: ADEQUATE
WBC: 4.2 10*3/uL (ref 4.0–10.5)
nRBC: 0 % (ref 0.0–0.2)

## 2018-11-11 LAB — FOLATE: Folate: 7.2 ng/mL (ref >5.9)

## 2018-11-11 LAB — VITAMIN B12: Vitamin B-12: 103 pg/mL — ABNORMAL LOW (ref 180–914)

## 2018-11-11 MED ORDER — VITAMIN B-12 1000 MCG PO TABS
1000.0000 ug | ORAL_TABLET | Freq: Every day | ORAL | Status: DC
  Administered 2018-11-11: 1000 ug via ORAL
  Filled 2018-11-11: qty 1

## 2018-11-11 MED ORDER — ENOXAPARIN SODIUM 40 MG/0.4ML ~~LOC~~ SOLN
40.0000 mg | SUBCUTANEOUS | Status: DC
  Administered 2018-11-12 – 2018-11-14 (×3): 40 mg via SUBCUTANEOUS
  Filled 2018-11-11 (×3): qty 0.4

## 2018-11-11 MED ORDER — VITAMIN B-1 100 MG PO TABS
100.0000 mg | ORAL_TABLET | Freq: Every day | ORAL | Status: DC
  Administered 2018-11-11 – 2018-11-16 (×6): 100 mg via ORAL
  Filled 2018-11-11 (×6): qty 1

## 2018-11-11 MED ORDER — CLOPIDOGREL BISULFATE 75 MG PO TABS
75.0000 mg | ORAL_TABLET | Freq: Every day | ORAL | Status: DC
  Administered 2018-11-11 – 2018-11-16 (×6): 75 mg via ORAL
  Filled 2018-11-11 (×6): qty 1

## 2018-11-11 MED ORDER — FOLIC ACID 1 MG PO TABS
1.0000 mg | ORAL_TABLET | Freq: Every day | ORAL | Status: DC
  Administered 2018-11-11 – 2018-11-16 (×6): 1 mg via ORAL
  Filled 2018-11-11 (×6): qty 1

## 2018-11-11 NOTE — Progress Notes (Signed)
Anticoagulation monitoring(Lovenox):   85 yo female ordered Lovenox 40 mg Q24h  Filed Weights   11/09/18 1557 11/10/18 0500  Weight: 130 lb (59 kg) 137 lb (62.1 kg)   BMI    Lab Results  Component Value Date   CREATININE 0.83 11/11/2018   CREATININE 1.03 (H) 11/10/2018   CREATININE 1.17 (H) 11/09/2018   Estimated Creatinine Clearance: 34.8 mL/min (by C-G formula based on SCr of 0.83 mg/dL). Hemoglobin & Hematocrit     Component Value Date/Time   HGB 11.4 (L) 11/11/2018 0502   HGB 13.4 01/04/2015 1351   HCT 34.3 (L) 11/11/2018 0502   HCT 41.3 01/04/2015 1351     Per Protocol for Patient with estCrcl < 30 ml/min and BMI < 40, will transition to Lovenox 30 mg Q24h.     Addendum - will readjust back to 40mg  qd now that CrCl > 30 3/6 @ [place], PharmD, BCPS Clinical Pharmacist 11/11/2018 8:02 AM

## 2018-11-11 NOTE — Progress Notes (Signed)
  Nutrition Brief Note  Patient identified on the Malnutrition Screening Tool (MST) Report  Patient stated that she was not a big eater at home and prepares mostly microwaved meals. Patient reports that she did not like what she was served for lunch. RD offered to send Ensure, patient adamantley declined. Weights reviewed and have remained stable since September 2019  Wt Readings from Last 15 Encounters:  11/10/18 62.1 kg  07/14/18 62.7 kg  05/23/18 62.1 kg  01/11/18 64.9 kg  07/13/17 68.9 kg  03/18/17 68 kg  02/15/17 68.4 kg  01/27/17 68.5 kg  01/26/17 68.9 kg  11/03/16 72 kg  04/02/16 68.7 kg  02/02/16 68 kg  01/07/16 70.3 kg  11/30/15 71.7 kg  10/03/15 70.1 kg    Body mass index is 26.76 kg/m. Patient meets criteria for overweight based on current BMI.   Current diet order is HH, patient is consuming approximately 50% of meals at this time. Labs and medications reviewed.   No nutrition interventions warranted at this time. If nutrition issues arise, please consult RD.   Lajuan Lines, RD, LDN  After Hours/Weekend Pager: [phone]

## 2018-11-11 NOTE — Anesthesia Postprocedure Evaluation (Signed)
Anesthesia Post Note  Patient: Elonna Mcfarlane  Procedure(s) Performed: INTRAMEDULLARY (IM) NAIL INTERTROCHANTRIC (Left Hip)  Patient location during evaluation: PACU Anesthesia Type: General Level of consciousness: awake and alert Pain management: pain level controlled Vital Signs Assessment: post-procedure vital signs reviewed and stable Respiratory status: spontaneous breathing, nonlabored ventilation, respiratory function stable and patient connected to nasal cannula oxygen Cardiovascular status: blood pressure returned to baseline and stable Postop Assessment: no apparent nausea or vomiting Anesthetic complications: no     Last Vitals:  Vitals:   11/10/18 2135 11/10/18 2353  BP: 129/73 (!) 123/50  Pulse: 74 77  Resp: 13 13  Temp: 36.6 C 36.6 C  SpO2: 90% 93%    Last Pain:  Vitals:   11/10/18 2135  TempSrc: Oral  PainSc:                  Precious Haws Piscit[place]

## 2018-11-11 NOTE — Care Management Important Message (Signed)
Important Message  Patient Details  Name: Krista Ortiz MRN: [id number] Date of Birth: [birth year]   Medicare Important Message Given:  Yes    Su Hilt, RN 11/11/2018, 1:49 PM

## 2018-11-11 NOTE — Evaluation (Signed)
Physical Therapy Evaluation Patient Details Name: Krista Ortiz MRN: [id number] DOB: 1924/06/17 Today's Date: 11/11/2018   History of Present Illness  Krista Ortiz is a 85yo female who [facility] on [facility] p fall at home c related hip fracture, underwent Lt hip ORIF on 3/5, now WBAT. Pt lives alone, drives, uses [employer] for limited community distance and HH AMB Son helps with groceries.   Clinical Impression  Pt admitted with above diagnosis. Pt currently with functional limitations due to the deficits listed below (see "PT Problem List"). Upon entry, pt in bed, son in room. The pt is awake and agreeable to participate. The pt is alert and oriented x3, pleasant, conversational, and following simple commands consistently. Pt HOH but able to provide history. Pt does well with bed level exercises, able to perform most A/ROM without needed physical assist or pain limitation. ModA required to EOB, and minA for standing from EOB. Pt needs minGuard Assist and heavy verbal cues for pivot steps to chair, much more pain limited. SpO2 at 90% on room air at end of session, so pt left on 2L/min. Functional mobility assessment demonstrates increased effort/time requirements, poor tolerance, and need for physical assistance, whereas the patient performed these at a higher level of independence PTA. Pt will benefit from skilled PT intervention to increase independence and safety with basic mobility in preparation for discharge to the venue listed below.       Follow Up Recommendations SNF;Supervision for mobility/OOB    Equipment Recommendations  None recommended by PT    Recommendations for Other Services       Precautions / Restrictions Precautions Precautions: Fall Restrictions LLE Weight Bearing: Weight bearing as tolerated      Mobility  Bed Mobility Overal bed mobility: Needs Assistance Bed Mobility: Supine to Sit     Supine to sit: Mod assist        Transfers Overall transfer level:  Needs assistance Equipment used: Rolling walker (2 wheeled) Transfers: Sit to/from Stand Sit to Stand: Min assist         General transfer comment: minA for tibial block and lift assist as posterior pelvis at end range   Ambulation/Gait Ambulation/Gait assistance: (several steps from bed ot chair only; quit elimited by pain)              Stairs            Wheelchair Mobility    Modified Rankin (Stroke Patients Only)       Balance Overall balance assessment: Needs assistance   Sitting balance-Leahy Scale: Good     Standing balance support: During functional activity;Bilateral upper extremity supported Standing balance-Leahy Scale: Fair                               Pertinent Vitals/Pain Pain Assessment: (pt doesn't use NPRS when asked, but reports is 'not too bad' )    [place] expects to be discharged to:: Private residence Living Arrangements: Alone Available Help at Discharge: Family Type of Home: Apartment Home Access: Level entry     Home Layout: One level Home Equipment: [place] - 4 wheels;Cane - single point;Wheelchair - Brewing technologist;Shower seat - built in;Grab bars - tub/shower;Grab bars - toilet;Hand held shower head      Prior Function Level of Independence: Independent         Comments: stll driving, Son brings groceries/meds, pt still doing bills and meds. Pt independent with  ADL, no prior falls in 6 months.      Hand Dominance   Dominant Hand: Right    Extremity/Trunk Assessment                Communication   Communication: HOH  Cognition Arousal/Alertness: Awake/alert Behavior During Therapy: WFL for tasks assessed/performed Overall Cognitive Status: Within Functional Limits for tasks assessed                                        General Comments      Exercises General Exercises - Lower Extremity Ankle Circles/Pumps: AROM;15 reps;Both Long Arc Quad:  AAROM;Left;10 reps Heel Slides: AAROM;10 reps;Left Hip ABduction/ADduction: AAROM;Left;10 reps Mini-Sqauts: Strengthening;Left;10 reps   Assessment/Plan    PT Assessment Patient needs continued PT services  PT Problem List Decreased strength;Decreased range of motion;Decreased activity tolerance;Decreased balance;Decreased mobility;Decreased knowledge of precautions       PT Treatment Interventions DME instruction;Therapeutic exercise;Gait training;Balance training;Stair training;Functional mobility training;Therapeutic activities;Patient/family education    PT Goals (Current goals can be found in the Care Plan section)  Acute Rehab PT Goals Patient Stated Goal: Return to indendent AMB  PT Goal Formulation: With patient Time For Goal Achievement: 11/25/18 Potential to Achieve Goals: Good    Frequency BID   Barriers to discharge Inaccessible home environment;Decreased caregiver support      Co-evaluation               AM-PAC PT "6 Clicks" Mobility  Outcome Measure Help needed turning from your back to your side while in a flat bed without using bedrails?: A Lot Help needed moving from lying on your back to sitting on the side of a flat bed without using bedrails?: A Lot Help needed moving to and from a bed to a chair (including a wheelchair)?: A Little Help needed standing up from a chair using your arms (e.g., wheelchair or bedside chair)?: A Little Help needed to walk in hospital room?: A Lot Help needed climbing 3-5 steps with a railing? : Total 6 Click Score: 13    End of Session Equipment Utilized During Treatment: Oxygen Activity Tolerance: Patient limited by pain;Patient tolerated treatment well;Patient limited by fatigue Patient left: in chair;with call bell/phone within reach;with family/visitor present;with chair alarm set Nurse Communication: Mobility status PT Visit Diagnosis: Unsteadiness on feet (R26.81);Other abnormalities of gait and mobility  (R26.89);Difficulty in walking, not elsewhere classified (R26.2)    Time: 3474-2595 PT Time Calculation (min) (ACUTE ONLY): 32 min   Charges:   PT Evaluation $PT Eval Low Complexity: 1 Low PT Treatments $Therapeutic Exercise: 8-22 mins        12:10 PM, 11/11/18 Etta Grandchild, PT, DPT Physical Therapist - [facility]  [phone] ([place])   Buccola,Allan C 11/11/2018, 12:05 PM

## 2018-11-11 NOTE — Progress Notes (Signed)
Physical Therapy Treatment Patient Details Name: Krista Ortiz MRN: [id number] DOB: 08-22-1924 Today's Date: 11/11/2018    History of Present Illness Krista Ortiz is a 85yo female who comes to [facility] on 3/4 p fall at home c related hip fracture, underwent Lt hip ORIF on 3/5, now WBAT. Pt lives alone, drives, uses [employer] for limited community distance and HH AMB Son helps with groceries.     PT Comments    PT arrived for afternoon treatment session, pt received in recliner. Pt adjusted back into chair (slid anteriorly upon arrival). Pt performs exercises with supervision, VC for instruction. Min-modA for STS from chair, but from elevated EOB able to perform 5x with RW at supervision, seat slightly elevated. Pt requires rest breaks between transfers 2/2 SOB. On room air, pt SpO2: 94% left on room air. 2 tylenol found in chair, given to RN.       Follow Up Recommendations  SNF;Supervision for mobility/OOB     Equipment Recommendations  None recommended by PT    Recommendations for Other Services       Precautions / Restrictions Precautions Precautions: Fall Restrictions LLE Weight Bearing: Weight bearing as tolerated    Mobility  Bed Mobility Overal bed mobility: Needs Assistance Bed Mobility: Sit to Supine     Supine to sit: Mod assist Sit to supine: Total assist      Transfers Overall transfer level: Needs assistance Equipment used: Rolling walker (2 wheeled) Transfers: Sit to/from Stand Sit to Stand: Supervision;Mod assist         General transfer comment: ModA from chair, but supervision x5 from elevated EOB  Ambulation/Gait Ambulation/Gait assistance: (tolrating steps for chair to bed only at this time)               Stairs             Wheelchair Mobility    Modified Rankin (Stroke Patients Only)       Balance Overall balance assessment: Needs assistance   Sitting balance-Leahy Scale: Good     Standing balance support: During  functional activity;Bilateral upper extremity supported Standing balance-Leahy Scale: Fair                              Cognition Arousal/Alertness: Awake/alert Behavior During Therapy: WFL for tasks assessed/performed Overall Cognitive Status: Within Functional Limits for tasks assessed                                        Exercises General Exercises - Lower Extremity Ankle Circles/Pumps: AROM;15 reps;Both Long Arc Quad: AAROM;Left;15 reps;Seated Heel Slides: AAROM;10 reps;Left Hip ABduction/ADduction: AAROM;Left;10 reps Mini-Sqauts: Strengthening;Left;10 reps    General Comments        Pertinent Vitals/Pain Pain Assessment: Faces Faces Pain Scale: Hurts little more Pain Location: Left anterior hip  Pain Intervention(s): Limited activity within patient's tolerance;Premedicated before session;Repositioned;Patient requesting pain meds-RN notified    Home Living                      Prior Function            PT Goals (current goals can now be found in the care plan section) Acute Rehab PT Goals Patient Stated Goal: Return to indendent AMB  PT Goal Formulation: With patient Time For Goal Achievement: 11/25/18 Potential to Achieve Goals: Good Progress  towards PT goals: Progressing toward goals    Frequency    BID      PT Plan Current plan remains appropriate    Co-evaluation              AM-PAC PT "6 Clicks" Mobility   Outcome Measure  Help needed turning from your back to your side while in a flat bed without using bedrails?: A Lot Help needed moving from lying on your back to sitting on the side of a flat bed without using bedrails?: A Lot Help needed moving to and from a bed to a chair (including a wheelchair)?: A Little Help needed standing up from a chair using your arms (e.g., wheelchair or bedside chair)?: A Little Help needed to walk in hospital room?: A Lot Help needed climbing 3-5 steps with a railing? :  Total 6 Click Score: 13    End of Session Equipment Utilized During Treatment: Oxygen Activity Tolerance: Patient limited by pain;Patient tolerated treatment well;Patient limited by fatigue Patient left: with call bell/phone within reach;with family/visitor present;in bed;with bed alarm set;with SCD's reapplied Nurse Communication: Mobility status PT Visit Diagnosis: Unsteadiness on feet (R26.81);Other abnormalities of gait and mobility (R26.89);Difficulty in walking, not elsewhere classified (R26.2)     Time: 3154-0086 PT Time Calculation (min) (ACUTE ONLY): 29 min  Charges:  $Therapeutic Exercise: 23-37 mins                     2:37 PM, 11/11/18 Etta Grandchild, PT, DPT Physical Therapist - [facility]  [phone] ([place])     Mekel Haverstock C 11/11/2018, 2:32 PM

## 2018-11-11 NOTE — Progress Notes (Signed)
   Subjective: 1 Day Post-Op Procedure(s) (LRB): INTRAMEDULLARY (IM) NAIL INTERTROCHANTRIC (Left) Patient reports pain as mild.   Patient is well, and has had no acute complaints or problems Denies any CP, SOB, ABD pain. We will start physical therapy today.   Objective: Vital signs in last 24 hours: Temp:  [96.7 F (35.9 C)-98.9 F (37.2 C)] 97.9 F (36.6 C) (03/05 2353) Pulse Rate:  [69-86] 77 (03/05 2353) Resp:  [12-22] 13 (03/05 2353) BP: (101-168)/(41-73) 123/50 (03/05 2353) SpO2:  [90 %-100 %] 93 % (03/05 2353)  Intake/Output from previous day: 03/05 0701 - 03/06 0700 In: 525 [I.V.:525] Out: 1400 [Urine:1350; Blood:50] Intake/Output this shift: No intake/output data recorded.  Recent Labs    11/09/18 1710 11/10/18 0455 11/11/18 0502  HGB 13.3 12.8 11.4*   Recent Labs    11/10/18 0455 11/11/18 0502  WBC 4.1 4.2  RBC 3.52* 3.11*  HCT 38.9 34.3*  PLT 319 250   Recent Labs    11/10/18 0455 11/11/18 0502  NA 135 134*  K 3.8 3.9  CL 99 100  CO2 28 27  BUN 14 13  CREATININE 1.03* 0.83  GLUCOSE 120* 129*  CALCIUM 8.8* 7.9*   Recent Labs    11/09/18 1710  INR 0.9    EXAM General - Patient is Alert, Appropriate and Oriented Extremity - Neurovascular intact Sensation intact distally Intact pulses distally Dorsiflexion/Plantar flexion intact No cellulitis present Compartment soft Dressing - dressing C/D/I and scant drainage Motor Function - intact, moving foot and toes well on exam.   Past Medical History:  Diagnosis Date  . Acoustic neuroma [facility])    md notes says history of acoustic neuroma or meningioma followed up by yearly MRI  . Chronic back pain   . COPD (chronic obstructive pulmonary disease) ([place])   . Hyperlipidemia   . Knee joint replacement status, right 11/25/2016  . Polycythemia   . Polycythemia, secondary 04/26/2015  . Port-A-Cath in place   . Presence of permanent cardiac pacemaker   . PVD (peripheral vascular disease) ([place])    . Right wrist fracture 11/25/2016  . Thoracic compression fracture, closed, initial encounter ([place]) 11/25/2016  . TIA (transient ischemic attack)     Assessment/Plan:   1 Day Post-Op Procedure(s) (LRB): INTRAMEDULLARY (IM) NAIL INTERTROCHANTRIC (Left) Active Problems:   Hip fracture (HCC)  Estimated body mass index is 26.76 kg/m as calculated from the following:   Height as of this encounter: 5' (1.524 m).   Weight as of this encounter: 62.1 kg. Advance diet Up with therapy, weightbearing as tolerated left lower extremity Pain well controlled Vital signs stable Labs stable.  Recheck labs in the morning Care management to assist with discharge   Patient will need follow-up with [facility] orthopedics in 2 weeks Lovenox 40 mg daily subcu x14 days after discharge  DVT Prophylaxis - Lovenox, TED hose and SCDs Weight-Bearing as tolerated to left leg   T. Rachelle Hora, PA-C [place] 11/11/2018, 7:50 AM

## 2018-11-11 NOTE — Progress Notes (Signed)
[place] [facility] [place] NAME: Krista Ortiz    MR#:  [id number]  DATE OF BIRTH:  April 05, 1924  SUBJECTIVE:  CHIEF COMPLAINT:   Chief Complaint  Patient presents with  . Fall  . Hip Pain   She came with a fall and left hip fracture. S/p surgery, Not much pain now.  REVIEW OF SYSTEMS:  CONSTITUTIONAL: No fever, fatigue or weakness.  EYES: No blurred or double vision.  EARS, NOSE, AND THROAT: No tinnitus or ear pain.  RESPIRATORY: No cough, shortness of breath, wheezing or hemoptysis.  CARDIOVASCULAR: No chest pain, orthopnea, edema.  GASTROINTESTINAL: No nausea, vomiting, diarrhea or abdominal pain.  GENITOURINARY: No dysuria, hematuria.  ENDOCRINE: No polyuria, nocturia,  HEMATOLOGY: No anemia, easy bruising or bleeding SKIN: No rash or lesion. MUSCULOSKELETAL: Left hip joint pain due to fracture.   NEUROLOGIC: No tingling, numbness, weakness.  PSYCHIATRY: No anxiety or depression.   ROS  DRUG ALLERGIES:   Allergies  Allergen Reactions  . Citalopram Other (See Comments)    Reaction:  Fatigue   . Pneumococcal Vaccine Other (See Comments)    Doesn't remember what occurred.  . Pneumovax [Pneumococcal Polysaccharide Vaccine] Other (See Comments)    Reaction:  Unknown   . Requip [Ropinirole Hcl] Other (See Comments)    Reaction:  Dry mouth     VITALS:  Blood pressure 117/61, pulse 93, temperature 98.3 F (36.8 C), temperature source Oral, resp. rate 16, height 5' (1.524 m), weight 62.1 kg, SpO2 97 %.  PHYSICAL EXAMINATION:  GENERAL:  85 y.o.-year-old patient lying in the bed with no acute distress.  EYES: Pupils equal, round, reactive to light and accommodation. No scleral icterus. Extraocular muscles intact.  HEENT: Head atraumatic, normocephalic. Oropharynx and nasopharynx clear.  NECK:  Supple, no jugular venous distention. No thyroid enlargement, no tenderness.  LUNGS: Normal breath sounds bilaterally, no wheezing,  rales,rhonchi or crepitation. No use of accessory muscles of respiration.  CARDIOVASCULAR: S1, S2 normal. No murmurs, rubs, or gallops.  ABDOMEN: Soft, nontender, nondistended. Bowel sounds present. No organomegaly or mass.  EXTREMITIES: No pedal edema, cyanosis, or clubbing.  NEUROLOGIC: Cranial nerves II through XII are intact. Muscle strength 4/5 in all extremities . Sensation intact. Gait not checked.  PSYCHIATRIC: The patient is alert and oriented x 3.  SKIN: No obvious rash, lesion, or ulcer.   Physical Exam LABORATORY PANEL:   CBC Recent Labs  Lab 11/11/18 0502  WBC 4.2  HGB 11.4*  HCT 34.3*  PLT 250   ------------------------------------------------------------------------------------------------------------------  Chemistries  Recent Labs  Lab 11/09/18 1710  11/11/18 0502  NA 139   < > 134*  K 4.1   < > 3.9  CL 101   < > 100  CO2 27   < > 27  GLUCOSE 130*   < > 129*  BUN 15   < > 13  CREATININE 1.17*   < > 0.83  CALCIUM 8.6*   < > 7.9*  AST 21  --   --   ALT 16  --   --   ALKPHOS 67  --   --   BILITOT 0.3  --   --    < > = values in this interval not displayed.   ------------------------------------------------------------------------------------------------------------------  Cardiac Enzymes No results for input(s): TROPONINI in the last 168 hours. ------------------------------------------------------------------------------------------------------------------  RADIOLOGY:  Dg Chest 1 View  Result Date: 11/09/2018 CLINICAL DATA:  Pre operative respiratory exam.  Left hip fracture. EXAM: CHEST  1 VIEW COMPARISON:  Chest x-ray dated 02/15/2017 FINDINGS: Heart size and vascularity are normal. Tortuosity and calcification of the thoracic aorta. Pacemaker and Port-A-Cath in place, unchanged. Tip of the Port-A-Cath is just above the cavoatrial junction. Lungs are clear.  No acute bone abnormality. IMPRESSION: No acute abnormalities. Aortic Atherosclerosis  (ICD10-I70.0). Electronically Signed   By: Lorriane Shire M.D.   On: 11/09/2018 16:52   Dg Ankle Complete Left  Result Date: 11/09/2018 CLINICAL DATA:  Trauma secondary to a fall today. Skin tear on the lower leg. Hip fracture. EXAM: LEFT ANKLE COMPLETE - 3+ VIEW COMPARISON:  None. FINDINGS: There is no fracture or dislocation or joint effusion. Small plantar calcaneal enthesophyte. Soft tissue swelling circumferentially around the ankle, most prominent anteriorly. IMPRESSION: No acute bone abnormality.  Soft tissue swelling. Electronically Signed   By: Lorriane Shire M.D.   On: 11/09/2018 16:50   Ct Head Wo Contrast  Result Date: 11/09/2018 CLINICAL DATA:  85 y/o  F; EXAM: CT HEAD WITHOUT CONTRAST TECHNIQUE: Contiguous axial images were obtained from the base of the skull through the vertex without intravenous contrast. COMPARISON:  01/27/2017 CT head. 03/07/2010 MRI head. FINDINGS: Brain: Stable left CP angle mass compatible with meningioma measuring up to 2 cm.No additional evidence of acute infarction, hemorrhage, hydrocephalus, extra-axial collection or mass lesion/mass effect. Nonspecific white matter hypodensities are compatible with chronic microvascular ischemic changes and there is volume loss of the brain which is stable from prior CT. Stable small chronic infarction within the left occipital lobe. Vascular: Calcific atherosclerosis of the carotid siphons and the vertebral arteries. No hyperdense vessel. Skull: Focus of right lateral scalp soft tissue thickening possibly a contusion. No calvarial fracture. Sinuses/Orbits: Mild maxillary and ethmoid sinus mucosal thickening. Aerosolized secretions in the left sphenoid sinus. Chronic inflammatory changes of the walls of the sphenoid sinuses. Partial opacification of right mastoid air cells. Bilateral intra-ocular lens replacement. Other: None. IMPRESSION: 1. No acute intracranial abnormality identified. 2. Stable left cerebellopontine angle  meningioma. 3. Stable chronic microvascular ischemic changes and parenchymal volume loss of the brain. 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 Electronically Signed   By: Kristine Garbe M.D.   On: 11/09/2018 16:56   Dg Hip Operative Unilat W Or W/o Pelvis Left  Result Date: 11/10/2018 CLINICAL DATA:  Left IM nail EXAM: OPERATIVE left HIP (WITH PELVIS IF PERFORMED) 3 VIEWS TECHNIQUE: Fluoroscopic spot image(s) were submitted for interpretation post-operatively. COMPARISON:  11/09/2018 FINDINGS: Three low resolution intraoperative spot views of the left hip. Total fluoroscopy time was 1 minutes 1 second. Intramedullary rod and distal screw fixation of the left femur for proximal femoral fracture. IMPRESSION: Intraoperative fluoroscopic assistance provided during surgical fixation of left femur fracture Electronically Signed   By: Donavan Foil M.D.   On: 11/10/2018 16:08   Dg Hip Unilat W Or Wo Pelvis 2-3 Views Left  Result Date: 11/09/2018 CLINICAL DATA:  85 y/o  F; fall with left hip pain. EXAM: DG HIP (WITH OR WITHOUT PELVIS) 2-3V LEFT COMPARISON:  11/30/2015 pelvis and left hip radiographs. FINDINGS: Acute minimally displaced left femur intertrochanteric fracture with coxa vara angulation. No pelvic fracture or diastasis identified. No dislocation of the hip joints. IMPRESSION: Acute minimally displaced left femur intertrochanteric fracture with coxa vara angulation. Electronically Signed   By: Kristine Garbe M.D.   On: 11/09/2018 16:49    ASSESSMENT AND PLAN:   Active Problems:   Hip fracture (HCC)  *Acute left close intertrochanteric hip fracture Secondary to  mechanical fall S/p IM nails- manage per ortho- PT started.  *COPD without exacerbation Breathing treatments PRN  *Chronic hyperlipidemia, unspecified Continue statin therapy  *History of TIA Continue statin therapy, hold Plavix  *Abnormal CT of the head Noted for meningioma Can follow-up  as an outpatient with neurosurgery if patient wishes Given advanced age would consider conservative therapy/monitoring  *Status post pacemaker placement due to SA nodal dysfunction Follows with cardiologist as outpatient, continue to monitor.  All the records are reviewed and case discussed with Care Management/Social Workerr. Management plans discussed with the patient, family and they are in agreement.  CODE STATUS: DNR.  TOTAL TIME TAKING CARE OF THIS PATIENT: 35 minutes.   Discussed with patient and her son in the room.  POSSIBLE D/C IN 1-2 DAYS, DEPENDING ON CLINICAL CONDITION.   Vaughan Basta M.D on 11/11/2018   Between 7am to 6pm - Pager - [phone]  After 6pm go to [email or web address] - password [email or web address] [facility]  Office  [phone]  CC: Primary care physician; Hortencia Pilar, MD  Note: This dictation was prepared with Dragon dictation along with smaller phrase technology. Any transcriptional errors that result from this process are unintentional.

## 2018-11-12 LAB — BASIC METABOLIC PANEL
Anion gap: 6 (ref 5–15)
BUN: 18 mg/dL (ref 8–23)
CALCIUM: 8.4 mg/dL — AB (ref 8.9–10.3)
CO2: 28 mmol/L (ref 22–32)
Chloride: 101 mmol/L (ref 98–111)
Creatinine, Ser: 1.04 mg/dL — ABNORMAL HIGH (ref 0.44–1.00)
GFR calc Af Amer: 54 mL/min — ABNORMAL LOW (ref >60)
GFR calc non Af Amer: 46 mL/min — ABNORMAL LOW (ref >60)
Glucose, Bld: 92 mg/dL (ref 70–99)
Potassium: 3.5 mmol/L (ref 3.5–5.1)
Sodium: 135 mmol/L (ref 135–145)

## 2018-11-12 MED ORDER — HYDROCODONE-ACETAMINOPHEN 5-325 MG PO TABS: 1.0000 | ORAL_TABLET | Freq: Four times a day (QID) | ORAL | 0 refills | Status: DC | PRN

## 2018-11-12 MED ORDER — ENOXAPARIN SODIUM 40 MG/0.4ML ~~LOC~~ SOLN: 40.0000 mg | SUBCUTANEOUS | 0 refills | Status: DC

## 2018-11-12 MED ORDER — VITAMIN B-12 1000 MCG PO TABS
1000.0000 ug | ORAL_TABLET | Freq: Every day | ORAL | Status: DC
  Administered 2018-11-13 – 2018-11-16 (×4): 1000 ug via ORAL
  Filled 2018-11-12 (×4): qty 1

## 2018-11-12 MED ORDER — CYANOCOBALAMIN 1000 MCG/ML IJ SOLN
1000.0000 ug | Freq: Once | INTRAMUSCULAR | Status: AC
  Administered 2018-11-12: 1000 ug via INTRAMUSCULAR
  Filled 2018-11-12: qty 1

## 2018-11-12 NOTE — Progress Notes (Signed)
Physical Therapy Treatment Patient Details Name: Krista Ortiz MRN: [id number] DOB: Jul 20, 1924 Today's Date: 11/12/2018    History of Present Illness Krista Ortiz is a 85yo female who comes to [facility] on 3/4 p fall at home c related hip fracture, underwent Lt hip ORIF on 3/5, now WBAT. Pt lives alone, drives, uses [employer] for limited community distance and HH AMB Son helps with groceries.     PT Comments    Patient received in recliner. Agrees to PT session. Patient requires min assist to rise from recliner. Cues needed for sequencing with RW for maximum effectiveness and pain reduction. Patient with good follow through. Able to ambulate 15 feet with RW and min guard. Returned to supine with max assist and +2 assist for scooting up in bed.  Patient will benefit from continued skilled PT to improve functional independence and safety with mobility.     Follow Up Recommendations  SNF;Supervision for mobility/OOB     Equipment Recommendations  None recommended by PT    Recommendations for Other Services       Precautions / Restrictions Precautions Precautions: Fall Restrictions Weight Bearing Restrictions: No LLE Weight Bearing: Weight bearing as tolerated    Mobility  Bed Mobility Overal bed mobility: Needs Assistance Bed Mobility: Sit to Supine       Sit to supine: Total assist;+2 for physical assistance   General bed mobility comments: requires max assist for sit to supine and +2 to scoot up in bed.   Transfers Overall transfer level: Needs assistance Equipment used: Rolling walker (2 wheeled) Transfers: Sit to/from Stand Sit to Stand: Min assist            Ambulation/Gait Ambulation/Gait assistance: Min guard Gait Distance (Feet): 15 Feet Assistive device: Rolling walker (2 wheeled) Gait Pattern/deviations: Step-to pattern;Trunk flexed;Shuffle Gait velocity: decreased   General Gait Details: cues for sequencing needed   Stairs              Wheelchair Mobility    Modified Rankin (Stroke Patients Only)       Balance Overall balance assessment: Needs assistance Sitting-balance support: Feet supported Sitting balance-Leahy Scale: Good     Standing balance support: Bilateral upper extremity supported Standing balance-Leahy Scale: Fair                              Cognition Arousal/Alertness: Awake/alert Behavior During Therapy: WFL for tasks assessed/performed Overall Cognitive Status: Within Functional Limits for tasks assessed                                        Exercises      General Comments        Pertinent Vitals/Pain Pain Assessment: Faces Faces Pain Scale: Hurts little more Pain Location: Left anterior hip  Pain Descriptors / Indicators: Aching;Discomfort;Sore Pain Intervention(s): Limited activity within patient's tolerance;Monitored during session;Repositioned;Premedicated before session    Home Living                      Prior Function            PT Goals (current goals can now be found in the care plan section) Acute Rehab PT Goals Patient Stated Goal: Return to indendent AMB  PT Goal Formulation: With patient Time For Goal Achievement: 11/25/18 Potential to Achieve Goals: Good Progress towards PT goals:  Progressing toward goals    Frequency    BID      PT Plan Current plan remains appropriate    Co-evaluation              AM-PAC PT "6 Clicks" Mobility   Outcome Measure  Help needed turning from your back to your side while in a flat bed without using bedrails?: Total Help needed moving from lying on your back to sitting on the side of a flat bed without using bedrails?: A Lot Help needed moving to and from a bed to a chair (including a wheelchair)?: A Little Help needed standing up from a chair using your arms (e.g., wheelchair or bedside chair)?: A Little Help needed to walk in hospital room?: A Little Help needed  climbing 3-5 steps with a railing? : Total 6 Click Score: 13    End of Session Equipment Utilized During Treatment: Gait belt Activity Tolerance: Patient limited by pain;Patient limited by fatigue Patient left: in bed;with bed alarm set;Other (comment)(call bell not working, Print production planner) Nurse Communication: Mobility status PT Visit Diagnosis: Unsteadiness on feet (R26.81);Muscle weakness (generalized) (M62.81);Difficulty in walking, not elsewhere classified (R26.2);Pain;History of falling (Z91.81) Pain - Right/Left: Left Pain - part of body: Hip     Time: 1030-1100 PT Time Calculation (min) (ACUTE ONLY): 30 min  Charges:  $Gait Training: 8-22 mins $Therapeutic Activity: 8-22 mins                     Louden Houseworth, PT, GCS 11/12/18,11:08 AM

## 2018-11-12 NOTE — Progress Notes (Signed)
After setting up cpap and taking it into room for this patient couple of hours ago, patient became very frustrated with RT because she stated she could not wear mask over her face. It was too much for her to to be able to stand. RT left and returned with a nasal mask. Patient very hesitant about that mask as well. She just could not understand why she could not get ones that fit in her nose. Upon investigation, patient was talking about nasal cannula, not cpap. I got a cannula and showed it to her and asked if that was what she used at home. She stated yes. Placed patient on nasal cannula at 2 liters and discontinued cpap order.

## 2018-11-12 NOTE — Clinical Social Work Note (Signed)
Clinical Social Work Assessment  Patient Details  Name: Krista Ortiz MRN: [id number] Date of Birth: 1924-02-01  Date of referral:  11/12/18               Reason for consult:  Facility Placement                Permission sought to share information with:  Chartered certified accountant granted to share information::  Yes, Verbal Permission Granted  Name::        Agency::  [facility]  Relationship::     Contact Information:     Housing/Transportation Living arrangements for the past 2 months:  Apartment Source of Information:  Patient, Scientist, water quality, Adult Children Patient Interpreter Needed:  None Criminal Activity/Legal Involvement Pertinent to Current Situation/Hospitalization:  No - Comment as needed Significant Relationships:  Adult Children, Church, [employer], Industrial/product designer, Friend Lives with:  Self Do you feel safe going back to the place where you live?  Yes Need for family participation in patient care:  No (Coment)  Care giving concerns:  Hip fracture, PT recommendation for SNF; Patient lives alone   Social Worker assessment / plan:  The CSW met with the patient at bedside to introduce self and explain role in care. The patient verbalized agreement with a referral for SNF; the CSW explained that the patient's Medicare provider would need to provide prior authorization. The patient shared that [facility] is her preference; however, if no beds are available, she is willing to make an alternate choice. The CSW was able to provide bed offers, and the patient narrowed her choice to [facility] or [facility] and asked that the CSW confer with the patient's sons for a final choice. The CSW contacted the patient's son, Jeneen Rinks, who agreed with [facility]. The CSW has updated such in the [place].  The CSW has updated the medical team of the choice and need for prior authorization. The hope is for the patient to discharge 11/14/2018.  Employment  status:  Retired Nurse, adult PT Recommendations:  [place] / Referral to community resources:  [place]  Patient/Family's Response to care:  The patient and her family thanked the CSW.  Patient/Family's Understanding of and Emotional Response to Diagnosis, Current Treatment, and Prognosis:  The patient and her family seem to understand the discharge plan and are in agreement.  Emotional Assessment Appearance:  Appears stated age Attitude/Demeanor/Rapport:  Apprehensive, Gracious Affect (typically observed):  Apprehensive, Pleasant Orientation:  Oriented to Self, Oriented to Place, Oriented to  Time, Oriented to Situation Alcohol / Substance use:  Never Used Psych involvement (Current and /or in the community):  No (Comment)  Discharge Needs  Concerns to be addressed:  Discharge Planning Concerns, Care Coordination Readmission within the last 30 days:  No Current discharge risk:  Chronically ill, Lives alone Barriers to Discharge:  Ship broker, Continued Medical Work up   [employer], LCSW 11/12/2018, 3:50 PM

## 2018-11-12 NOTE — Progress Notes (Signed)
Physical Therapy Treatment Patient Details Name: Krista Ortiz MRN: [id number] DOB: [birth year] Today's Date: 11/12/2018    History of Present Illness Krista Ortiz is a 85yo female who comes to [facility] on 3/4 p fall at home c related hip fracture, underwent Lt hip ORIF on 3/5, now WBAT. Pt lives alone, drives, uses [facility] for limited community distance and HH AMB Son helps with groceries.     PT Comments    Patient received in bed, just finished lunch. Agrees to PT session. Patient requires mod assist to perform supine to sit bed mobility. Pain limited. Patient requires mod assist with sit to stand transfers , is not able to ambulate as much this pm due to increased pain. Ambulated 3 feet from bed to recliner with min assist and cues for sequencing. Patient will benefit from continued skilled PT to address her weakness, difficulty with mobility and decreased activity tolerance.      Follow Up Recommendations  SNF;Supervision for mobility/OOB     Equipment Recommendations  None recommended by PT    Recommendations for Other Services       Precautions / Restrictions Precautions Precautions: Fall Restrictions Weight Bearing Restrictions: No LLE Weight Bearing: Weight bearing as tolerated    Mobility  Bed Mobility Overal bed mobility: Needs Assistance Bed Mobility: Supine to Sit     Supine to sit: Mod assist Sit to supine: Total assist;+2 for physical assistance   General bed mobility comments: requires max assist for sit to supine and +2 to scoot up in bed.   Transfers Overall transfer level: Needs assistance Equipment used: Rolling walker (2 wheeled) Transfers: Sit to/from Stand Sit to Stand: Mod assist            Ambulation/Gait Ambulation/Gait assistance: Min guard Gait Distance (Feet): 3 Feet Assistive device: Rolling walker (2 wheeled) Gait Pattern/deviations: Step-to pattern;Shuffle;Decreased step length - left;Decreased step length - right Gait velocity:  decreased   General Gait Details: cues for sequencing needed   Stairs             Wheelchair Mobility    Modified Rankin (Stroke Patients Only)       Balance Overall balance assessment: Needs assistance Sitting-balance support: Bilateral upper extremity supported;Feet supported Sitting balance-Leahy Scale: Fair     Standing balance support: Bilateral upper extremity supported Standing balance-Leahy Scale: Fair                              Cognition Arousal/Alertness: Awake/alert Behavior During Therapy: WFL for tasks assessed/performed Overall Cognitive Status: Within Functional Limits for tasks assessed                                        Exercises Total Joint Exercises Ankle Circles/Pumps: AROM;10 reps;Both Quad Sets: AROM;10 reps;Left Heel Slides: AAROM;10 reps;Left Hip ABduction/ADduction: 10 reps;AAROM;Left    General Comments        Pertinent Vitals/Pain Pain Assessment: Faces Faces Pain Scale: Hurts even more Pain Location: Left anterior hip  Pain Descriptors / Indicators: Aching;Discomfort;Sore;Operative site guarding Pain Intervention(s): Limited activity within patient's tolerance;Monitored during session;Repositioned    Home Living                      Prior Function            PT Goals (current goals can now be  found in the care plan section) Acute Rehab PT Goals Patient Stated Goal: Return to indendent AMB  PT Goal Formulation: With patient Time For Goal Achievement: 11/25/18 Potential to Achieve Goals: Fair Progress towards PT goals: Progressing toward goals    Frequency    BID      PT Plan Current plan remains appropriate    Co-evaluation              AM-PAC PT "6 Clicks" Mobility   Outcome Measure  Help needed turning from your back to your side while in a flat bed without using bedrails?: A Lot Help needed moving from lying on your back to sitting on the side of a flat  bed without using bedrails?: A Lot Help needed moving to and from a bed to a chair (including a wheelchair)?: A Lot Help needed standing up from a chair using your arms (e.g., wheelchair or bedside chair)?: A Little Help needed to walk in hospital room?: A Lot Help needed climbing 3-5 steps with a railing? : Total 6 Click Score: 12    End of Session Equipment Utilized During Treatment: Gait belt;Oxygen Activity Tolerance: Patient limited by fatigue;Patient limited by pain Patient left: in chair;with chair alarm set;with call bell/phone within reach Nurse Communication: Mobility status PT Visit Diagnosis: Unsteadiness on feet (R26.81);Muscle weakness (generalized) (M62.81);Difficulty in walking, not elsewhere classified (R26.2);Pain;History of falling (Z91.81) Pain - Right/Left: Left Pain - part of body: Hip     Time: 8648-4720 PT Time Calculation (min) (ACUTE ONLY): 25 min  Charges:  $Gait Training: 8-22 mins $Therapeutic Exercise: 8-22 mins $Therapeutic Activity: 8-22 mins                     Jadine Brumley, PT, GCS 11/12/18,2:08 PM

## 2018-11-12 NOTE — Progress Notes (Signed)
   Subjective: 2 Days Post-Op Procedure(s) (LRB): INTRAMEDULLARY (IM) NAIL INTERTROCHANTRIC (Left) Patient reports pain as mild.   Patient is well, and has had no acute complaints or problems Denies any CP, SOB, ABD pain. We will continue with physical therapy today.   Objective: Vital signs in last 24 hours: Temp:  [97.8 F (36.6 C)-98.6 F (37 C)] 98.6 F (37 C) (03/07 0758) Pulse Rate:  [78-93] 88 (03/07 0758) Resp:  [16-20] 19 (03/07 0758) BP: (114-135)/(47-61) 114/57 (03/07 0758) SpO2:  [91 %-99 %] 99 % (03/07 0758) Weight:  [63.5 kg] 63.5 kg (03/07 0526)  Intake/Output from previous day: No intake/output data recorded. Intake/Output this shift: No intake/output data recorded.  Recent Labs    11/09/18 1710 11/10/18 0455 11/11/18 0502  HGB 13.3 12.8 11.4*   Recent Labs    11/10/18 0455 11/11/18 0502  WBC 4.1 4.2  RBC 3.52* 3.11*  HCT 38.9 34.3*  PLT 319 250   Recent Labs    11/11/18 0502 11/12/18 0451  NA 134* 135  K 3.9 3.5  CL 100 101  CO2 27 28  BUN 13 18  CREATININE 0.83 1.04*  GLUCOSE 129* 92  CALCIUM 7.9* 8.4*   Recent Labs    11/09/18 1710  INR 0.9    EXAM General - Patient is Alert, Appropriate and Oriented Extremity - Neurovascular intact Sensation intact distally Intact pulses distally Dorsiflexion/Plantar flexion intact No cellulitis present Compartment soft Dressing - dressing C/D/I and scant drainage Motor Function - intact, moving foot and toes well on exam.   Past Medical History:  Diagnosis Date  . Acoustic neuroma [facility])    md notes says history of acoustic neuroma or meningioma followed up by yearly MRI  . Chronic back pain   . COPD (chronic obstructive pulmonary disease) ([place])   . Hyperlipidemia   . Knee joint replacement status, right 11/25/2016  . Polycythemia   . Polycythemia, secondary 04/26/2015  . Port-A-Cath in place   . Presence of permanent cardiac pacemaker   . PVD (peripheral vascular disease) ([place])    . Right wrist fracture 11/25/2016  . Thoracic compression fracture, closed, initial encounter ([place]) 11/25/2016  . TIA (transient ischemic attack)     Assessment/Plan:   2 Days Post-Op Procedure(s) (LRB): INTRAMEDULLARY (IM) NAIL INTERTROCHANTRIC (Left) Active Problems:   Hip fracture (HCC)  Estimated body mass index is 27.34 kg/m as calculated from the following:   Height as of this encounter: 5' (1.524 m).   Weight as of this encounter: 63.5 kg. Advance diet Up with therapy, weightbearing as tolerated left lower extremity Pain well controlled Vital signs stable Labs stable.  Care management to assist with discharge   Patient will need follow-up with [facility] orthopedics in 2 weeks for staple removal Lovenox 40 mg daily subcu x14 days after discharge  DVT Prophylaxis - Lovenox, TED hose and SCDs Weight-Bearing as tolerated to left leg   T. Rachelle Hora, PA-C [place] 11/12/2018, 8:01 AM

## 2018-11-12 NOTE — Progress Notes (Signed)
[place] [facility] [place] NAME: Krista Ortiz    MR#:  [id number]  DATE OF BIRTH:  03/16/1924  SUBJECTIVE:  CHIEF COMPLAINT:   Chief Complaint  Patient presents with  . Fall  . Hip Pain   Pain at surgical site No SOB  REVIEW OF SYSTEMS:  CONSTITUTIONAL: No fever, fatigue or weakness.  EYES: No blurred or double vision.  EARS, NOSE, AND THROAT: No tinnitus or ear pain.  RESPIRATORY: No cough, shortness of breath, wheezing or hemoptysis.  CARDIOVASCULAR: No chest pain, orthopnea, edema.  GASTROINTESTINAL: No nausea, vomiting, diarrhea or abdominal pain.  GENITOURINARY: No dysuria, hematuria.  ENDOCRINE: No polyuria, nocturia,  HEMATOLOGY: No anemia, easy bruising or bleeding SKIN: No rash or lesion. MUSCULOSKELETAL: Left hip joint pain due to fracture.   NEUROLOGIC: No tingling, numbness, weakness.  PSYCHIATRY: No anxiety or depression.   ROS  DRUG ALLERGIES:   Allergies  Allergen Reactions  . Citalopram Other (See Comments)    Reaction:  Fatigue   . Pneumococcal Vaccine Other (See Comments)    Doesn't remember what occurred.  . Pneumovax [Pneumococcal Polysaccharide Vaccine] Other (See Comments)    Reaction:  Unknown   . Requip [Ropinirole Hcl] Other (See Comments)    Reaction:  Dry mouth     VITALS:  Blood pressure (!) 114/57, pulse 88, temperature 98.6 F (37 C), temperature source Oral, resp. rate 19, height 5' (1.524 m), weight 63.5 kg, SpO2 90 %.  PHYSICAL EXAMINATION:  GENERAL:  85 y.o.-year-old patient lying in the bed with no acute distress.  EYES: Pupils equal, round, reactive to light and accommodation. No scleral icterus. Extraocular muscles intact.  HEENT: Head atraumatic, normocephalic. Oropharynx and nasopharynx clear.  NECK:  Supple, no jugular venous distention. No thyroid enlargement, no tenderness.  LUNGS: Normal breath sounds bilaterally, no wheezing, rales,rhonchi or crepitation. No use of accessory  muscles of respiration.  CARDIOVASCULAR: S1, S2 normal. No murmurs, rubs, or gallops.  ABDOMEN: Soft, nontender, nondistended. Bowel sounds present. No organomegaly or mass.  EXTREMITIES: No pedal edema, cyanosis, or clubbing.  NEUROLOGIC: Cranial nerves II through XII are intact. Muscle strength 4/5 in all extremities . Sensation intact. Gait not checked.  PSYCHIATRIC: The patient is alert and oriented x 3.  SKIN: No obvious rash, lesion, or ulcer.   Physical Exam LABORATORY PANEL:   CBC Recent Labs  Lab 11/11/18 0502  WBC 4.2  HGB 11.4*  HCT 34.3*  PLT 250   ------------------------------------------------------------------------------------------------------------------  Chemistries  Recent Labs  Lab 11/09/18 1710  11/12/18 0451  NA 139   < > 135  K 4.1   < > 3.5  CL 101   < > 101  CO2 27   < > 28  GLUCOSE 130*   < > 92  BUN 15   < > 18  CREATININE 1.17*   < > 1.04*  CALCIUM 8.6*   < > 8.4*  AST 21  --   --   ALT 16  --   --   ALKPHOS 67  --   --   BILITOT 0.3  --   --    < > = values in this interval not displayed.   ------------------------------------------------------------------------------------------------------------------  Cardiac Enzymes No results for input(s): TROPONINI in the last 168 hours. ------------------------------------------------------------------------------------------------------------------  RADIOLOGY:  Dg Hip Operative Unilat W Or W/o Pelvis Left  Result Date: 11/10/2018 CLINICAL DATA:  Left IM nail EXAM: OPERATIVE left HIP (WITH PELVIS IF PERFORMED) 3 VIEWS  TECHNIQUE: Fluoroscopic spot image(s) were submitted for interpretation post-operatively. COMPARISON:  11/09/2018 FINDINGS: Three low resolution intraoperative spot views of the left hip. Total fluoroscopy time was 1 minutes 1 second. Intramedullary rod and distal screw fixation of the left femur for proximal femoral fracture. IMPRESSION: Intraoperative fluoroscopic assistance  provided during surgical fixation of left femur fracture Electronically Signed   By: Donavan Foil M.D.   On: 11/10/2018 16:08    ASSESSMENT AND PLAN:   Active Problems:   Hip fracture (HCC)  *Acute left close intertrochanteric hip fracture Secondary to mechanical fall S/p IM nails-POD-2 PT evaluated SNF at discharge  *COPD without exacerbation Breathing treatments PRN  *Chronic hyperlipidemia, unspecified Continue statin therapy  *History of TIA Continue statin therapy, hold Plavix  *Abnormal CT of the head Noted for meningioma outpatient with neurosurgery Given advanced age would consider conservative therapy/monitoring  * Pacemaker placement due to SA nodal dysfunction Follows with cardiologist as outpatient, continue to monitor.  All the records are reviewed and case discussed with Care Management/Social Workerr. Management plans discussed with the patient, family and they are in agreement.  CODE STATUS: DNR.  TOTAL TIME TAKING CARE OF THIS PATIENT: 35 minutes.   POSSIBLE D/C IN 1-2 DAYS, DEPENDING ON CLINICAL CONDITION.   Neita Carp M.D on 11/12/2018   Between 7am to 6pm - Pager - [phone]  After 6pm go to [email or web address] - password [email or web address] [place] [facility]  Office  [phone]  CC: Primary care physician; Hortencia Pilar, MD  Note: This dictation was prepared with Dragon dictation along with smaller phrase technology. Any transcriptional errors that result from this process are unintentional.

## 2018-11-13 LAB — BASIC METABOLIC PANEL
Anion gap: 8 (ref 5–15)
BUN: 21 mg/dL (ref 8–23)
CO2: 28 mmol/L (ref 22–32)
Calcium: 8.5 mg/dL — ABNORMAL LOW (ref 8.9–10.3)
Chloride: 98 mmol/L (ref 98–111)
Creatinine, Ser: 1.04 mg/dL — ABNORMAL HIGH (ref 0.44–1.00)
GFR calc Af Amer: 54 mL/min — ABNORMAL LOW (ref >60)
GFR calc non Af Amer: 46 mL/min — ABNORMAL LOW (ref >60)
Glucose, Bld: 91 mg/dL (ref 70–99)
POTASSIUM: 3.7 mmol/L (ref 3.5–5.1)
Sodium: 134 mmol/L — ABNORMAL LOW (ref 135–145)

## 2018-11-13 NOTE — Progress Notes (Signed)
Physical Therapy Treatment Patient Details Name: Krista Ortiz MRN: [id number] DOB: 26-Dec-1923 Today's Date: 11/13/2018    History of Present Illness Krista Ortiz is a 85yo female who comes to [facility] on 3/4 p fall at home c related hip fracture, underwent Lt hip ORIF on 3/5, now WBAT. Pt lives alone, drives, uses [facility] for limited community distance and HH AMB Son helps with groceries.     PT Comments    Patient was able to increase her ambulation distance significantly this session, up to 15' with RW and improved sequencing prior to requiring rest break. She is still requiring mod A with RW and verbal cuing for sequencing to perform transfers,  Bed mobility was not observed in this session. Given her stark change in physical capacity, she would still likely be most appropriate for SNF placement at discharge.    Follow Up Recommendations  SNF;Supervision for mobility/OOB     Equipment Recommendations  None recommended by PT    Recommendations for Other Services       Precautions / Restrictions Precautions Precautions: Fall Restrictions Weight Bearing Restrictions: Yes LLE Weight Bearing: Weight bearing as tolerated    Mobility  Bed Mobility               General bed mobility comments: Deferred as patient was up in chair.   Transfers Overall transfer level: Needs assistance Equipment used: Rolling walker (2 wheeled) Transfers: Sit to/from Stand Sit to Stand: Mod assist         General transfer comment: Patient required assistance with transfer and verbal cuing for sequencing with UE placement.   Ambulation/Gait Ambulation/Gait assistance: Min guard Gait Distance (Feet): 15 Feet Assistive device: Rolling walker (2 wheeled) Gait Pattern/deviations: Step-to pattern;Shuffle;Decreased step length - left;Decreased step length - right;Step-through pattern Gait velocity: decreased Gait velocity interpretation: <1.8 ft/sec, indicate of risk for recurrent  falls General Gait Details: Patient required vc's for sequencing and technique but was able to complete step through gait pattern with balance assist required.    Stairs             Wheelchair Mobility    Modified Rankin (Stroke Patients Only)       Balance Overall balance assessment: Needs assistance Sitting-balance support: Bilateral upper extremity supported;Feet supported Sitting balance-Leahy Scale: Fair     Standing balance support: Bilateral upper extremity supported Standing balance-Leahy Scale: Fair                              Cognition Arousal/Alertness: Awake/alert Behavior During Therapy: WFL for tasks assessed/performed Overall Cognitive Status: Within Functional Limits for tasks assessed                                        Exercises Total Joint Exercises Ankle Circles/Pumps: AROM;10 reps;Both Gluteal Sets: AROM;Both;10 reps Heel Slides: AROM;AAROM;Both;15 reps Hip ABduction/ADduction: AAROM;AROM;Both;10 reps Straight Leg Raises: AROM;AAROM;Both;10 reps Long Arc Quad: AROM;AAROM;10 reps;Both Marching in Standing: Seated;AAROM;AROM;10 reps    General Comments        Pertinent Vitals/Pain Faces Pain Scale: Hurts whole lot Pain Location: Left anterior hip  Pain Descriptors / Indicators: Aching;Discomfort;Sore;Operative site guarding Pain Intervention(s): Limited activity within patient's tolerance;Monitored during session;Repositioned    Home Living                      Prior  Function            PT Goals (current goals can now be found in the care plan section) Acute Rehab PT Goals Patient Stated Goal: Return to indendent AMB  PT Goal Formulation: With patient Time For Goal Achievement: 11/25/18 Potential to Achieve Goals: Fair Progress towards PT goals: Progressing toward goals    Frequency    BID      PT Plan Current plan remains appropriate    Co-evaluation               AM-PAC PT "6 Clicks" Mobility   Outcome Measure  Help needed turning from your back to your side while in a flat bed without using bedrails?: A Lot Help needed moving from lying on your back to sitting on the side of a flat bed without using bedrails?: A Lot Help needed moving to and from a bed to a chair (including a wheelchair)?: A Lot Help needed standing up from a chair using your arms (e.g., wheelchair or bedside chair)?: A Little Help needed to walk in hospital room?: A Lot Help needed climbing 3-5 steps with a railing? : Total 6 Click Score: 12    End of Session Equipment Utilized During Treatment: Gait belt;Oxygen Activity Tolerance: Patient limited by fatigue;Patient limited by pain Patient left: in chair;with chair alarm set;with call bell/phone within reach;with family/visitor present Nurse Communication: Mobility status PT Visit Diagnosis: Unsteadiness on feet (R26.81);Muscle weakness (generalized) (M62.81);Difficulty in walking, not elsewhere classified (R26.2);Pain;History of falling (Z91.81) Pain - Right/Left: Left Pain - part of body: Hip     Time: 0370-4888 PT Time Calculation (min) (ACUTE ONLY): 24 min  Charges:  $Gait Training: 8-22 mins $Therapeutic Exercise: 8-22 mins                    Royce Macadamia PT, DPT, CSCS    11/13/2018, 2:36 PM

## 2018-11-13 NOTE — Plan of Care (Signed)
  Problem: Activity: Goal: Ability to ambulate and perform ADLs will improve Outcome: Progressing   Problem: Pain Management: Goal: Pain level will decrease Outcome: Progressing   Problem: Education: Goal: Verbalization of understanding the information provided (i.e., activity precautions, restrictions, etc) will improve Outcome: Completed/Met   Problem: Education: Goal: Knowledge of General Education information will improve Description Including pain rating scale, medication(s)/side effects and non-pharmacologic comfort measures Outcome: Completed/Met   Problem: Elimination: Goal: Will not experience complications related to bowel motility Outcome: Completed/Met Goal: Will not experience complications related to urinary retention Outcome: Completed/Met

## 2018-11-13 NOTE — Progress Notes (Signed)
[facility] NAME: Krista Ortiz    MR#:  [id number]  DATE OF BIRTH:  October 10, 1923  SUBJECTIVE:  CHIEF COMPLAINT:   Chief Complaint  Patient presents with  . Fall  . Hip Pain   Pain at surgical site No SOB  Feels weak  REVIEW OF SYSTEMS:  CONSTITUTIONAL: No fever, fatigue or weakness.  EYES: No blurred or double vision.  EARS, NOSE, AND THROAT: No tinnitus or ear pain.  RESPIRATORY: No cough, shortness of breath, wheezing or hemoptysis.  CARDIOVASCULAR: No chest pain, orthopnea, edema.  GASTROINTESTINAL: No nausea, vomiting, diarrhea or abdominal pain.  GENITOURINARY: No dysuria, hematuria.  ENDOCRINE: No polyuria, nocturia,  HEMATOLOGY: No anemia, easy bruising or bleeding SKIN: No rash or lesion. MUSCULOSKELETAL: Left hip joint pain due to fracture.   NEUROLOGIC: No tingling, numbness, weakness.  PSYCHIATRY: No anxiety or depression.   ROS  DRUG ALLERGIES:   Allergies  Allergen Reactions  . Citalopram Other (See Comments)    Reaction:  Fatigue   . Pneumococcal Vaccine Other (See Comments)    Doesn't remember what occurred.  . Pneumovax [Pneumococcal Polysaccharide Vaccine] Other (See Comments)    Reaction:  Unknown   . Requip [Ropinirole Hcl] Other (See Comments)    Reaction:  Dry mouth     VITALS:  Blood pressure 127/82, pulse 75, temperature 98.3 F (36.8 C), temperature source Oral, resp. rate 18, height 5' (1.524 m), weight 63.5 kg, SpO2 96 %.  PHYSICAL EXAMINATION:  GENERAL:  85 y.o.-year-old patient lying in the bed with no acute distress.  EYES: Pupils equal, round, reactive to light and accommodation. No scleral icterus. Extraocular muscles intact.  HEENT: Head atraumatic, normocephalic. Oropharynx and nasopharynx clear.  NECK:  Supple, no jugular venous distention. No thyroid enlargement, no tenderness.  LUNGS: Normal breath sounds bilaterally, no wheezing, rales,rhonchi or crepitation. No use of  accessory muscles of respiration.  CARDIOVASCULAR: S1, S2 normal. No murmurs, rubs, or gallops.  ABDOMEN: Soft, nontender, nondistended. Bowel sounds present. No organomegaly or mass.  EXTREMITIES: No pedal edema, cyanosis, or clubbing.  NEUROLOGIC: Cranial nerves II through XII are intact. Muscle strength 4/5 in all extremities . Sensation intact. Gait not checked.  PSYCHIATRIC: The patient is alert and oriented x 3.  SKIN: No obvious rash, lesion, or ulcer.   Physical Exam LABORATORY PANEL:   CBC Recent Labs  Lab 11/11/18 0502  WBC 4.2  HGB 11.4*  HCT 34.3*  PLT 250   ------------------------------------------------------------------------------------------------------------------  Chemistries  Recent Labs  Lab 11/09/18 1710  11/13/18 0452  NA 139   < > 134*  K 4.1   < > 3.7  CL 101   < > 98  CO2 27   < > 28  GLUCOSE 130*   < > 91  BUN 15   < > 21  CREATININE 1.17*   < > 1.04*  CALCIUM 8.6*   < > 8.5*  AST 21  --   --   ALT 16  --   --   ALKPHOS 67  --   --   BILITOT 0.3  --   --    < > = values in this interval not displayed.   ------------------------------------------------------------------------------------------------------------------  Cardiac Enzymes No results for input(s): TROPONINI in the last 168 hours. ------------------------------------------------------------------------------------------------------------------  RADIOLOGY:  No results found.  ASSESSMENT AND PLAN:   Active Problems:   Hip fracture (HCC)  *Acute left close intertrochanteric hip fracture Secondary to mechanical fall  S/p IM nails-POD-3 PT evaluated SNF at discharge tomorrow  *COPD without exacerbation Breathing treatments PRN  *Chronic hyperlipidemia, unspecified Continue statin therapy  *History of TIA Continue statin therapy, hold Plavix  *Abnormal CT of the head Noted for meningioma outpatient with neurosurgery Given advanced age would consider conservative  therapy/monitoring  * Pacemaker placement due to SA nodal dysfunction Follows with cardiologist as outpatient, continue to monitor.  All the records are reviewed and case discussed with Care Management/Social Workerr. Management plans discussed with the patient, family and they are in agreement.  CODE STATUS: DNR.  TOTAL TIME TAKING CARE OF THIS PATIENT: 25 minutes.   POSSIBLE D/C IN 1-2 DAYS, DEPENDING ON CLINICAL CONDITION.   Neita Carp M.D on 11/13/2018   Between 7am to 6pm - Pager - [phone]  After 6pm go to [email or web address] - password [email or web address] [facility]  Office  [phone]  CC: Primary care physician; Hortencia Pilar, MD  Note: This dictation was prepared with Dragon dictation along with smaller phrase technology. Any transcriptional errors that result from this process are unintentional.

## 2018-11-13 NOTE — Progress Notes (Signed)
   Subjective: 3 Days Post-Op Procedure(s) (LRB): INTRAMEDULLARY (IM) NAIL INTERTROCHANTRIC (Left) Patient reports pain as mild.   Patient is well, and has had no acute complaints or problems Denies any CP, SOB, ABD pain. We will continue with physical therapy today.   Objective: Vital signs in last 24 hours: Temp:  [98.3 F (36.8 C)-98.5 F (36.9 C)] 98.3 F (36.8 C) (03/08 0814) Pulse Rate:  [75-77] 75 (03/08 0814) Resp:  [17-18] 18 (03/08 0814) BP: (110-127)/(57-82) 127/82 (03/08 0814) SpO2:  [90 %-97 %] 96 % (03/08 0814)  Intake/Output from previous day: No intake/output data recorded. Intake/Output this shift: No intake/output data recorded.  Recent Labs    11/11/18 0502  HGB 11.4*   Recent Labs    11/11/18 0502  WBC 4.2  RBC 3.11*  HCT 34.3*  PLT 250   Recent Labs    11/12/18 0451 11/13/18 0452  NA 135 134*  K 3.5 3.7  CL 101 98  CO2 28 28  BUN 18 21  CREATININE 1.04* 1.04*  GLUCOSE 92 91  CALCIUM 8.4* 8.5*   No results for input(s): LABPT, INR in the last 72 hours.  EXAM General - Patient is Alert, Appropriate and Oriented Extremity - Neurovascular intact Sensation intact distally Intact pulses distally Dorsiflexion/Plantar flexion intact No cellulitis present Compartment soft Dressing - dressing C/D/I and scant drainage Motor Function - intact, moving foot and toes well on exam.   Past Medical History:  Diagnosis Date  . Acoustic neuroma [facility])    md notes says history of acoustic neuroma or meningioma followed up by yearly MRI  . Chronic back pain   . COPD (chronic obstructive pulmonary disease) ([place])   . Hyperlipidemia   . Knee joint replacement status, right 11/25/2016  . Polycythemia   . Polycythemia, secondary 04/26/2015  . Port-A-Cath in place   . Presence of permanent cardiac pacemaker   . PVD (peripheral vascular disease) ([place])   . Right wrist fracture 11/25/2016  . Thoracic compression fracture, closed, initial encounter  ([place]) 11/25/2016  . TIA (transient ischemic attack)     Assessment/Plan:   3 Days Post-Op Procedure(s) (LRB): INTRAMEDULLARY (IM) NAIL INTERTROCHANTRIC (Left) Active Problems:   Hip fracture (HCC)  Estimated body mass index is 27.34 kg/m as calculated from the following:   Height as of this encounter: 5' (1.524 m).   Weight as of this encounter: 63.5 kg. Advance diet Up with therapy, weightbearing as tolerated left lower extremity Pain well controlled Vital signs stable Labs stable.  Care management to assist with discharge to SNF   Patient will need follow-up with [facility] orthopedics in 2 weeks for staple removal Lovenox 40 mg daily subcu x14 days after discharge  DVT Prophylaxis - Lovenox, TED hose and SCDs Weight-Bearing as tolerated to left leg   T. Rachelle Hora, PA-C [place] 11/13/2018, 9:33 AM

## 2018-11-14 MED ORDER — ENOXAPARIN SODIUM 30 MG/0.3ML ~~LOC~~ SOLN
30.0000 mg | SUBCUTANEOUS | Status: DC
  Administered 2018-11-15 – 2018-11-16 (×2): 30 mg via SUBCUTANEOUS
  Filled 2018-11-14 (×2): qty 0.3

## 2018-11-14 NOTE — Progress Notes (Signed)
   Subjective: 4 Days Post-Op Procedure(s) (LRB): INTRAMEDULLARY (IM) NAIL INTERTROCHANTRIC (Left) Patient reports pain as mild.   Patient is well, and has had no acute complaints or problems Denies any CP, SOB, ABD pain. We will continue with physical therapy today.   Objective: Vital signs in last 24 hours: Temp:  [97.7 F (36.5 C)-98.3 F (36.8 C)] 97.7 F (36.5 C) (03/09 0750) Pulse Rate:  [73-75] 73 (03/09 0750) Resp:  [18-20] 20 (03/09 0750) BP: (127-135)/(50-82) 135/55 (03/09 0750) SpO2:  [96 %-99 %] 99 % (03/09 0750)  Intake/Output from previous day: 03/08 0701 - 03/09 0700 In: -  Out: 450 [Urine:450] Intake/Output this shift: No intake/output data recorded.  No results for input(s): HGB in the last 72 hours. No results for input(s): WBC, RBC, HCT, PLT in the last 72 hours. Recent Labs    11/12/18 0451 11/13/18 0452  NA 135 134*  K 3.5 3.7  CL 101 98  CO2 28 28  BUN 18 21  CREATININE 1.04* 1.04*  GLUCOSE 92 91  CALCIUM 8.4* 8.5*   No results for input(s): LABPT, INR in the last 72 hours.  EXAM General - Patient is Alert, Appropriate and Oriented Extremity - Neurovascular intact Sensation intact distally Intact pulses distally Dorsiflexion/Plantar flexion intact No cellulitis present Compartment soft Dressing - dressing C/D/I and scant drainage Motor Function - intact, moving foot and toes well on exam.   Past Medical History:  Diagnosis Date  . Acoustic neuroma [facility])    md notes says history of acoustic neuroma or meningioma followed up by yearly MRI  . Chronic back pain   . COPD (chronic obstructive pulmonary disease) ([place])   . Hyperlipidemia   . Knee joint replacement status, right 11/25/2016  . Polycythemia   . Polycythemia, secondary 04/26/2015  . Port-A-Cath in place   . Presence of permanent cardiac pacemaker   . PVD (peripheral vascular disease) ([place])   . Right wrist fracture 11/25/2016  . Thoracic compression fracture, closed,  initial encounter ([place]) 11/25/2016  . TIA (transient ischemic attack)     Assessment/Plan:   4 Days Post-Op Procedure(s) (LRB): INTRAMEDULLARY (IM) NAIL INTERTROCHANTRIC (Left) Active Problems:   Hip fracture (HCC)  Estimated body mass index is 27.34 kg/m as calculated from the following:   Height as of this encounter: 5' (1.524 m).   Weight as of this encounter: 63.5 kg. Advance diet Up with therapy, weightbearing as tolerated left lower extremity Pain well controlled Vital signs stable Labs stable.  Care management to assist with discharge to SNF   Patient will need follow-up with [facility] orthopedics in 2 weeks for staple removal Lovenox 40 mg daily subcu x14 days after discharge  DVT Prophylaxis - Lovenox, TED hose and SCDs Weight-Bearing as tolerated to left leg   T. Rachelle Hora, PA-C [place] 11/14/2018, 8:13 AM

## 2018-11-14 NOTE — Progress Notes (Signed)
Physical Therapy Treatment Patient Details Name: Krista Ortiz MRN: [id number] DOB: Jan 10, 1924 Today's Date: 11/14/2018    History of Present Illness Krista Ortiz is a 85yo female who comes to [facility] on 3/4 p fall at home c related hip fracture, underwent Lt hip ORIF on 3/5, now WBAT. Pt lives alone, drives, uses [employer] for limited community distance and HH AMB Son helps with groceries.     PT Comments    Pt presents with deficits in strength, transfers, mobility, gait, balance, and activity tolerance.  Pt required min A with bed mobility tasks and transfers.  Pt was able to amb 15' x 1 and 10' x 1 with RW and CGA with slow, antalgic cadence but was steady without LOB.  SpO2 >/=96% on room air during session.  Pt continues to presents with deficits in functional mobility compared to her baseline and will benefit from PT services in a SNF setting upon discharge to safely address above deficits for decreased caregiver assistance and eventual return to PLOF.     Follow Up Recommendations  SNF;Supervision for mobility/OOB     Equipment Recommendations  None recommended by PT    Recommendations for Other Services       Precautions / Restrictions Precautions Precautions: Fall Restrictions Weight Bearing Restrictions: Yes LLE Weight Bearing: Weight bearing as tolerated    Mobility  Bed Mobility Overal bed mobility: Needs Assistance Bed Mobility: Supine to Sit;Sit to Supine     Supine to sit: Min assist     General bed mobility comments: Min A for BLEs out of bed and for trunk to full upright position  Transfers Overall transfer level: Needs assistance Equipment used: Rolling walker (2 wheeled) Transfers: Sit to/from Stand Sit to Stand: Min assist;From elevated surface         General transfer comment: Patient required min verbal cues for sequencing during transfers  Ambulation/Gait Ambulation/Gait assistance: Min guard Gait Distance (Feet): 15 Feet Assistive device:  Rolling walker (2 wheeled) Gait Pattern/deviations: Step-to pattern;Step-through pattern;Decreased stance time - left;Decreased step length - right;Antalgic Gait velocity: decreased   General Gait Details: Slow cadence with antalgic gait on the LLE but steady without LOB   Stairs             Wheelchair Mobility    Modified Rankin (Stroke Patients Only)       Balance Overall balance assessment: Needs assistance Sitting-balance support: Bilateral upper extremity supported;Feet supported Sitting balance-Leahy Scale: Fair     Standing balance support: Bilateral upper extremity supported Standing balance-Leahy Scale: Fair                              Cognition Arousal/Alertness: Awake/alert Behavior During Therapy: WFL for tasks assessed/performed Overall Cognitive Status: Within Functional Limits for tasks assessed                                        Exercises Total Joint Exercises Ankle Circles/Pumps: AROM;10 reps;Both;Strengthening Quad Sets: 10 reps;Strengthening;Both Gluteal Sets: Strengthening;Both;10 reps Hip ABduction/ADduction: AAROM;Both;10 reps Long Arc Quad: 10 reps;Both;AROM;15 reps Knee Flexion: AROM;Both;10 reps;15 reps    General Comments        Pertinent Vitals/Pain Pain Assessment: 0-10 Pain Score: 6  Pain Location: Left hip  Pain Descriptors / Indicators: Aching;Sore Pain Intervention(s): Premedicated before session;Monitored during session    Home Living  Prior Function            PT Goals (current goals can now be found in the care plan section) Progress towards PT goals: Progressing toward goals    Frequency    BID      PT Plan Current plan remains appropriate    Co-evaluation              AM-PAC PT "6 Clicks" Mobility   Outcome Measure  Help needed turning from your back to your side while in a flat bed without using bedrails?: A Little Help needed  moving from lying on your back to sitting on the side of a flat bed without using bedrails?: A Little Help needed moving to and from a bed to a chair (including a wheelchair)?: A Little Help needed standing up from a chair using your arms (e.g., wheelchair or bedside chair)?: A Little Help needed to walk in hospital room?: A Little Help needed climbing 3-5 steps with a railing? : Total 6 Click Score: 16    End of Session Equipment Utilized During Treatment: Gait belt;Oxygen Activity Tolerance: Patient limited by fatigue;Patient limited by pain Patient left: in chair;with chair alarm set;with call bell/phone within reach;with family/visitor present Nurse Communication: Mobility status PT Visit Diagnosis: Unsteadiness on feet (R26.81);Muscle weakness (generalized) (M62.81);Difficulty in walking, not elsewhere classified (R26.2);Pain;History of falling (Z91.81) Pain - Right/Left: Left Pain - part of body: Hip     Time: 1010-1035 PT Time Calculation (min) (ACUTE ONLY): 25 min  Charges:  $Gait Training: 8-22 mins $Therapeutic Exercise: 8-22 mins                     D. Scott Rai Severns PT, DPT 11/14/18, 1:06 PM

## 2018-11-14 NOTE — Care Management Important Message (Signed)
Important Message  Patient Details  Name: Krista Ortiz MRN: [id number] Date of Birth: 04-29-1924   Medicare Important Message Given:  Yes    Juliann Pulse A Wyndham Santilli 11/14/2018, 11:09 AM

## 2018-11-14 NOTE — Care Management Important Message (Signed)
Important Message  Patient Details  Name: Krista Ortiz MRN: [id number] Date of Birth: 04-26-1924   Medicare Important Message Given:  Yes    Su Hilt, RN 11/14/2018, 11:27 AM

## 2018-11-14 NOTE — Progress Notes (Signed)
Physical Therapy Treatment Patient Details Name: Krista Ortiz MRN: [id number] DOB: [birth year] Today's Date: 11/14/2018    History of Present Illness Krista Ortiz is a 85yo female who comes to [facility] on 3/4 p fall at home c related hip fracture, underwent Lt hip ORIF on 3/5, now WBAT. Pt lives alone, drives, uses [employer] for limited community distance and HH AMB Son helps with groceries.     PT Comments    Pt presents with deficits in strength, transfers, mobility, gait, balance, and activity tolerance.  Pt required min A with transfers with min verbal cues for proper sequencing.  Pt was able to amb 15' with a RW and CGA with very slow cadence and with antalgic gait on the LLE but was steady without LOB.  Pt's SpO2 on 2LO2/min at baseline was 97% with nsg giving ok for trial of PT on room air.  After session pt's SpO2 remained at 93% on room air with nursing requesting pt to be left on room air.  Pt will benefit from PT services in a SNF setting upon discharge to safely address above deficits for decreased caregiver assistance and eventual return to PLOF.     Follow Up Recommendations  SNF;Supervision for mobility/OOB     Equipment Recommendations  None recommended by PT    Recommendations for Other Services       Precautions / Restrictions Precautions Precautions: Fall Restrictions Weight Bearing Restrictions: Yes LLE Weight Bearing: Weight bearing as tolerated    Mobility  Bed Mobility Overal bed mobility: Needs Assistance Bed Mobility: Supine to Sit;Sit to Supine     Supine to sit: Min assist     General bed mobility comments: NT, pt in recliner  Transfers Overall transfer level: Needs assistance Equipment used: Rolling walker (2 wheeled) Transfers: Sit to/from Stand Sit to Stand: Min assist         General transfer comment: Patient required min verbal cues for sequencing during transfers  Ambulation/Gait Ambulation/Gait assistance: Min guard Gait Distance  (Feet): 15 Feet Assistive device: Rolling walker (2 wheeled) Gait Pattern/deviations: Step-to pattern;Step-through pattern;Decreased stance time - left;Decreased step length - right;Antalgic Gait velocity: decreased   General Gait Details: Slow cadence with antalgic gait on the LLE but steady without LOB   Stairs             Wheelchair Mobility    Modified Rankin (Stroke Patients Only)       Balance Overall balance assessment: Needs assistance Sitting-balance support: Bilateral upper extremity supported;Feet supported Sitting balance-Leahy Scale: Fair     Standing balance support: Bilateral upper extremity supported Standing balance-Leahy Scale: Fair                              Cognition Arousal/Alertness: Awake/alert Behavior During Therapy: WFL for tasks assessed/performed Overall Cognitive Status: Within Functional Limits for tasks assessed                                        Exercises Total Joint Exercises Ankle Circles/Pumps: AROM;10 reps;Both;Strengthening Quad Sets: 10 reps;Strengthening;Both Gluteal Sets: Strengthening;Both;10 reps Hip ABduction/ADduction: AAROM;Both;10 reps Long Arc Quad: 10 reps;Both;AROM;15 reps Knee Flexion: AROM;Both;10 reps;15 reps Other Exercises Other Exercises: Static standing x 8 min for hygiene with CGA    General Comments        Pertinent Vitals/Pain Pain Assessment: 0-10 Pain Score: 6  Pain Location: Left hip  Pain Descriptors / Indicators: Aching;Sore Pain Intervention(s): Monitored during session    Home Living                      Prior Function            PT Goals (current goals can now be found in the care plan section) Progress towards PT goals: Progressing toward goals    Frequency    BID      PT Plan Current plan remains appropriate    Co-evaluation              AM-PAC PT "6 Clicks" Mobility   Outcome Measure  Help needed turning from your  back to your side while in a flat bed without using bedrails?: A Little Help needed moving from lying on your back to sitting on the side of a flat bed without using bedrails?: A Little Help needed moving to and from a bed to a chair (including a wheelchair)?: A Little Help needed standing up from a chair using your arms (e.g., wheelchair or bedside chair)?: A Little Help needed to walk in hospital room?: A Little Help needed climbing 3-5 steps with a railing? : A Lot 6 Click Score: 17    End of Session Equipment Utilized During Treatment: Gait belt Activity Tolerance: Patient tolerated treatment well Patient left: in chair;with call bell/phone within reach;with nursing/sitter in room(Pt left with nursing for dressing) Nurse Communication: Mobility status;Other (comment)(Pt's SpO2 93% on room air after session with nsg requesting pt remain on room air) PT Visit Diagnosis: Unsteadiness on feet (R26.81);Muscle weakness (generalized) (M62.81);Difficulty in walking, not elsewhere classified (R26.2);Pain;History of falling (Z91.81) Pain - Right/Left: Left Pain - part of body: Hip     Time: 9163-8466 PT Time Calculation (min) (ACUTE ONLY): 23 min  Charges:  $Gait Training: 8-22 mins $Therapeutic Exercise: 8-22 mins                     D. Scott Roshaunda Starkey PT, DPT 11/14/18, 3:42 PM

## 2018-11-14 NOTE — Plan of Care (Signed)
  Problem: Activity: Goal: Ability to ambulate and perform ADLs will improve Outcome: Progressing   Problem: Clinical Measurements: Goal: Postoperative complications will be avoided or minimized Outcome: Progressing   Problem: Self-Concept: Goal: Ability to maintain and perform role responsibilities to the fullest extent possible will improve Outcome: Progressing   Problem: Pain Management: Goal: Pain level will decrease Outcome: Progressing   Problem: Clinical Measurements: Goal: Ability to maintain clinical measurements within normal limits will improve Outcome: Progressing Goal: Will remain free from infection Outcome: Progressing Goal: Diagnostic test results will improve Outcome: Progressing Goal: Respiratory complications will improve Outcome: Progressing Goal: Cardiovascular complication will be avoided Outcome: Progressing

## 2018-11-14 NOTE — Progress Notes (Signed)
[facility] [place] NAME: Krista Ortiz    MR#:  [id number]  DATE OF BIRTH:  [birth year]  SUBJECTIVE:  CHIEF COMPLAINT:   Chief Complaint  Patient presents with  . Fall  . Hip Pain   Pain at surgical site Worked with PT and sitting in a chair.  REVIEW OF SYSTEMS:  CONSTITUTIONAL: No fever, fatigue or weakness.  EYES: No blurred or double vision.  EARS, NOSE, AND THROAT: No tinnitus or ear pain.  RESPIRATORY: No cough, shortness of breath, wheezing or hemoptysis.  CARDIOVASCULAR: No chest pain, orthopnea, edema.  GASTROINTESTINAL: No nausea, vomiting, diarrhea or abdominal pain.  GENITOURINARY: No dysuria, hematuria.  ENDOCRINE: No polyuria, nocturia,  HEMATOLOGY: No anemia, easy bruising or bleeding SKIN: No rash or lesion. MUSCULOSKELETAL: Left hip joint pain due to fracture.   NEUROLOGIC: No tingling, numbness, weakness.  PSYCHIATRY: No anxiety or depression.   ROS  DRUG ALLERGIES:   Allergies  Allergen Reactions  . Citalopram Other (See Comments)    Reaction:  Fatigue   . Pneumococcal Vaccine Other (See Comments)    Doesn't remember what occurred.  . Pneumovax [Pneumococcal Polysaccharide Vaccine] Other (See Comments)    Reaction:  Unknown   . Requip [Ropinirole Hcl] Other (See Comments)    Reaction:  Dry mouth     VITALS:  Blood pressure (!) 135/55, pulse 73, temperature 97.7 F (36.5 C), temperature source Oral, resp. rate 20, height 5' (1.524 m), weight 63.5 kg, SpO2 99 %.  PHYSICAL EXAMINATION:  GENERAL:  85 y.o.-year-old patient lying in the bed with no acute distress.  EYES: Pupils equal, round, reactive to light and accommodation. No scleral icterus. Extraocular muscles intact.  HEENT: Head atraumatic, normocephalic. Oropharynx and nasopharynx clear.  NECK:  Supple, no jugular venous distention. No thyroid enlargement, no tenderness.  LUNGS: Normal breath sounds bilaterally, no wheezing, rales,rhonchi or  crepitation. No use of accessory muscles of respiration.  CARDIOVASCULAR: S1, S2 normal. No murmurs, rubs, or gallops.  ABDOMEN: Soft, nontender, nondistended. Bowel sounds present. No organomegaly or mass.  EXTREMITIES: No pedal edema, cyanosis, or clubbing.  NEUROLOGIC: Cranial nerves II through XII are intact. Muscle strength 4/5 in all extremities . Sensation intact. Gait not checked.  PSYCHIATRIC: The patient is alert and oriented x 3.  SKIN: No obvious rash, lesion, or ulcer.   Physical Exam LABORATORY PANEL:   CBC Recent Labs  Lab 11/11/18 0502  WBC 4.2  HGB 11.4*  HCT 34.3*  PLT 250   ------------------------------------------------------------------------------------------------------------------  Chemistries  Recent Labs  Lab 11/09/18 1710  11/13/18 0452  NA 139   < > 134*  K 4.1   < > 3.7  CL 101   < > 98  CO2 27   < > 28  GLUCOSE 130*   < > 91  BUN 15   < > 21  CREATININE 1.17*   < > 1.04*  CALCIUM 8.6*   < > 8.5*  AST 21  --   --   ALT 16  --   --   ALKPHOS 67  --   --   BILITOT 0.3  --   --    < > = values in this interval not displayed.   ------------------------------------------------------------------------------------------------------------------  Cardiac Enzymes No results for input(s): TROPONINI in the last 168 hours. ------------------------------------------------------------------------------------------------------------------  RADIOLOGY:  No results found.  ASSESSMENT AND PLAN:   Active Problems:   Hip fracture (HCC)  *Acute left close intertrochanteric hip fracture  Secondary to mechanical fall S/p IM nails-POD-4 PT working with patient SNF at discharge once insurance approval available  *COPD without exacerbation Breathing treatments PRN  *Chronic hyperlipidemia, unspecified Continue statin therapy  *History of TIA Continue statin therapy, hold Plavix  *Abnormal CT of the head Noted for meningioma outpatient with  neurosurgery Given advanced age would consider conservative therapy/monitoring  * Pacemaker placement due to SA nodal dysfunction Follows with cardiologist as outpatient, continue to monitor.  All the records are reviewed and case discussed with Care Management/Social Workerr. Management plans discussed with the patient, family and they are in agreement.  CODE STATUS: DNR.  TOTAL TIME TAKING CARE OF THIS PATIENT: 25 minutes.   POSSIBLE D/C IN 1-2 DAYS, DEPENDING ON CLINICAL CONDITION.   Neita Carp M.D on 11/14/2018   Between 7am to 6pm - Pager - [phone]  After 6pm go to [email or web address] - password [email or web address] [facility]  Office  [phone]  CC: Primary care physician; Hortencia Pilar, MD  Note: This dictation was prepared with Dragon dictation along with smaller phrase technology. Any transcriptional errors that result from this process are unintentional.

## 2018-11-15 MED ORDER — HYDROCODONE-ACETAMINOPHEN 5-325 MG PO TABS: 1.0000 | ORAL_TABLET | Freq: Four times a day (QID) | ORAL | 0 refills | Status: DC | PRN

## 2018-11-15 NOTE — Progress Notes (Signed)
Physical Therapy Treatment Patient Details Name: Krista Ortiz MRN: [id number] DOB: 1924/01/05 Today's Date: 11/15/2018    History of Present Illness Krista Ortiz is a 85yo female who comes to [facility] on 3/4 p fall at home c related hip fracture, underwent Lt hip ORIF on 3/5, now WBAT. Pt lives alone, drives, uses [employer] for limited community distance and HH AMB Son helps with groceries.     PT Comments    Pt presents with deficits in strength, transfers, mobility, gait, balance, and activity tolerance.  Pt required min A with transfers and cues for sequencing.  Pt was able to amb 10 feet with some improvement in pain this session compared to this AM but continues to be limited by L hip pain with WB activities.  Pt tolerated the below therex well and is motivated to participate with PT services.  Pt will benefit from PT services in a SNF setting upon discharge to safely address above deficits for decreased caregiver assistance and eventual return to PLOF.     Follow Up Recommendations  SNF;Supervision for mobility/OOB     Equipment Recommendations  None recommended by PT    Recommendations for Other Services       Precautions / Restrictions Precautions Precautions: Fall Restrictions Weight Bearing Restrictions: Yes LLE Weight Bearing: Weight bearing as tolerated    Mobility  Bed Mobility Overal bed mobility: Needs Assistance Bed Mobility: Supine to Sit     Supine to sit: Mod assist     General bed mobility comments: NT, pt in recliner  Transfers Overall transfer level: Needs assistance Equipment used: Rolling walker (2 wheeled) Transfers: Sit to/from Stand Sit to Stand: Min assist         General transfer comment: Patient required min verbal cues for sequencing during transfers  Ambulation/Gait Ambulation/Gait assistance: Min guard Gait Distance (Feet): 10 Feet Assistive device: Rolling walker (2 wheeled) Gait Pattern/deviations: Step-to pattern;Step-through  pattern;Decreased stance time - left;Decreased step length - right;Antalgic Gait velocity: decreased   General Gait Details: Slow, antalgic gait on the LLE with verbal cues for step-to sequencing to minimize L hip pain with gait    Stairs             Wheelchair Mobility    Modified Rankin (Stroke Patients Only)       Balance Overall balance assessment: Needs assistance Sitting-balance support: Bilateral upper extremity supported;Feet supported Sitting balance-Leahy Scale: Fair     Standing balance support: Bilateral upper extremity supported Standing balance-Leahy Scale: Fair                              Cognition Arousal/Alertness: Awake/alert Behavior During Therapy: WFL for tasks assessed/performed Overall Cognitive Status: Within Functional Limits for tasks assessed                                        Exercises Total Joint Exercises Ankle Circles/Pumps: AROM;10 reps;Both;Strengthening;15 reps Quad Sets: 10 reps;Strengthening;Both;15 reps Gluteal Sets: Strengthening;Both;10 reps;15 reps Short Arc Quad: Strengthening;Both;10 reps Heel Slides: AROM;AAROM;Both;10 reps Hip ABduction/ADduction: AAROM;Both;10 reps Straight Leg Raises: AROM;AAROM;Both;10 reps Long Arc Quad: 10 reps;Both;AROM;15 reps Knee Flexion: AROM;Both;10 reps;15 reps Other Exercises Other Exercises: HEP education/review for BLE APs, QS, GS, and LAQ x 10 each 5-6x/day    General Comments        Pertinent Vitals/Pain Pain Assessment: 0-10 Pain Score:  4  Pain Location: Left hip  Pain Descriptors / Indicators: Aching;Sore Pain Intervention(s): Premedicated before session;Monitored during session    Home Living                      Prior Function            PT Goals (current goals can now be found in the care plan section) Progress towards PT goals: Progressing toward goals    Frequency    BID      PT Plan Current plan remains  appropriate    Co-evaluation              AM-PAC PT "6 Clicks" Mobility   Outcome Measure  Help needed turning from your back to your side while in a flat bed without using bedrails?: A Little Help needed moving from lying on your back to sitting on the side of a flat bed without using bedrails?: A Little Help needed moving to and from a bed to a chair (including a wheelchair)?: A Little Help needed standing up from a chair using your arms (e.g., wheelchair or bedside chair)?: A Little Help needed to walk in hospital room?: A Little Help needed climbing 3-5 steps with a railing? : Total 6 Click Score: 16    End of Session Equipment Utilized During Treatment: Gait belt;Oxygen Activity Tolerance: Patient limited by pain Patient left: in chair;with call bell/phone within reach;with nursing/sitter in room;with family/visitor present;Other (comment) Nurse Communication: Mobility status PT Visit Diagnosis: Unsteadiness on feet (R26.81);Muscle weakness (generalized) (M62.81);Difficulty in walking, not elsewhere classified (R26.2);Pain;History of falling (Z91.81) Pain - Right/Left: Left Pain - part of body: Hip     Time: 6415-8309 PT Time Calculation (min) (ACUTE ONLY): 23 min  Charges:  $Gait Training: 8-22 mins $Therapeutic Exercise: 8-22 mins                     D. Scott Keyoni Lapinski PT, DPT 11/15/18, 3:32 PM

## 2018-11-15 NOTE — Progress Notes (Signed)
Physical Therapy Treatment Patient Details Name: Krista Ortiz MRN: [id number] DOB: 16-Sep-1923 Today's Date: 11/15/2018    History of Present Illness Krista Ortiz is a 85yo female who comes to [facility] on 3/4 p fall at home c related hip fracture, underwent Lt hip ORIF on 3/5, now WBAT. Pt lives alone, drives, uses [facility] for limited community distance and HH AMB Son helps with groceries.     PT Comments    Pt presents with deficits in strength, transfers, mobility, gait, balance, and activity tolerance.  Pt required mod A with sup to sit this session with cues for sequencing.  Pt needed min A to come to standing from the EOB and was only able to amb 10' with session with antalgic, step-to gait pattern and heavy lean on the RW.  Pt on 2LO2/min this session with SpO2 >/= 93% throughout.  Pt will benefit from PT services in a SNF setting upon discharge to safely address above deficits for decreased caregiver assistance and eventual return to PLOF.     Follow Up Recommendations  SNF;Supervision for mobility/OOB     Equipment Recommendations  None recommended by PT    Recommendations for Other Services       Precautions / Restrictions Precautions Precautions: Fall Restrictions Weight Bearing Restrictions: Yes LLE Weight Bearing: Weight bearing as tolerated    Mobility  Bed Mobility Overal bed mobility: Needs Assistance Bed Mobility: Supine to Sit     Supine to sit: Mod assist     General bed mobility comments: Mod A for BLE and trunk control  Transfers Overall transfer level: Needs assistance Equipment used: Rolling walker (2 wheeled) Transfers: Sit to/from Stand Sit to Stand: Min assist         General transfer comment: Patient required min verbal cues for sequencing during transfers  Ambulation/Gait Ambulation/Gait assistance: Min guard Gait Distance (Feet): 10 Feet Assistive device: Rolling walker (2 wheeled) Gait Pattern/deviations: Step-to  pattern;Step-through pattern;Decreased stance time - left;Decreased step length - right;Antalgic Gait velocity: decreased   General Gait Details: Increased L hip pain with amb this session with pt only able to amb a max of 10' with slow, effortful steps and heavy lean on the RW; pt educated on proper sequencing with the RW during amb to minimize LLE pain.    Stairs             Wheelchair Mobility    Modified Rankin (Stroke Patients Only)       Balance Overall balance assessment: Needs assistance Sitting-balance support: Bilateral upper extremity supported;Feet supported Sitting balance-Leahy Scale: Fair     Standing balance support: Bilateral upper extremity supported Standing balance-Leahy Scale: Fair                              Cognition Arousal/Alertness: Awake/alert Behavior During Therapy: WFL for tasks assessed/performed Overall Cognitive Status: Within Functional Limits for tasks assessed                                        Exercises Total Joint Exercises Ankle Circles/Pumps: AROM;10 reps;Both;Strengthening Quad Sets: 10 reps;Strengthening;Both Gluteal Sets: Strengthening;Both;10 reps Hip ABduction/ADduction: AAROM;Both;10 reps Straight Leg Raises: AROM;AAROM;Both;10 reps Long Arc Quad: 10 reps;Both;AROM;15 reps Knee Flexion: AROM;Both;10 reps;15 reps Other Exercises Other Exercises: Rolling left and right to pt's tolerance with min-mod A and cues for sequencing  General Comments        Pertinent Vitals/Pain Pain Assessment: 0-10 Pain Score: 5  Pain Location: Left hip  Pain Descriptors / Indicators: Aching;Sore Pain Intervention(s): Premedicated before session;Monitored during session    Home Living                      Prior Function            PT Goals (current goals can now be found in the care plan section) Progress towards PT goals: Not progressing toward goals - comment(limited by pain)     Frequency    BID      PT Plan Current plan remains appropriate    Co-evaluation              AM-PAC PT "6 Clicks" Mobility   Outcome Measure  Help needed turning from your back to your side while in a flat bed without using bedrails?: A Little Help needed moving from lying on your back to sitting on the side of a flat bed without using bedrails?: A Lot Help needed moving to and from a bed to a chair (including a wheelchair)?: A Little Help needed standing up from a chair using your arms (e.g., wheelchair or bedside chair)?: A Little Help needed to walk in hospital room?: A Little Help needed climbing 3-5 steps with a railing? : Total 6 Click Score: 15    End of Session Equipment Utilized During Treatment: Gait belt Activity Tolerance: Patient limited by pain Patient left: in chair;with call bell/phone within reach;with nursing/sitter in room;with family/visitor present;Other (comment)(No SCDs in room, nsg aware) Nurse Communication: Mobility status PT Visit Diagnosis: Unsteadiness on feet (R26.81);Muscle weakness (generalized) (M62.81);Difficulty in walking, not elsewhere classified (R26.2);Pain;History of falling (Z91.81) Pain - Right/Left: Left Pain - part of body: Hip     Time: 0940-1006 PT Time Calculation (min) (ACUTE ONLY): 26 min  Charges:  $Gait Training: 8-22 mins $Therapeutic Exercise: 8-22 mins                     D. Scott Shailey Butterbaugh PT, DPT 11/15/18, 1:02 PM

## 2018-11-15 NOTE — Discharge Summary (Addendum)
[facility] [place] NAME: Krista Ortiz    MR#:  [id number]  DATE OF BIRTH:  November 18, 1923  DATE OF ADMISSION:  11/09/2018 ADMITTING PHYSICIAN: Gorden Harms, MD  DATE OF DISCHARGE: 11/16/2018  PRIMARY CARE PHYSICIAN: Hortencia Pilar, MD   ADMISSION DIAGNOSIS:  Fracture [T14.8XXA] Closed fracture of left hip, initial encounter ([place]) [S72.002A]  DISCHARGE DIAGNOSIS:  Active Problems:   Hip fracture ([place])   SECONDARY DIAGNOSIS:   Past Medical History:  Diagnosis Date  . Acoustic neuroma [facility])    md notes says history of acoustic neuroma or meningioma followed up by yearly MRI  . Chronic back pain   . COPD (chronic obstructive pulmonary disease) ([place])   . Hyperlipidemia   . Knee joint replacement status, right 11/25/2016  . Polycythemia   . Polycythemia, secondary 04/26/2015  . Port-A-Cath in place   . Presence of permanent cardiac pacemaker   . PVD (peripheral vascular disease) ([place])   . Right wrist fracture 11/25/2016  . Thoracic compression fracture, closed, initial encounter ([place]) 11/25/2016  . TIA (transient ischemic attack)      ADMITTING HISTORY  HISTORY OF PRESENT ILLNESS: Krista Ortiz  is a 85 y.o. female with a known history of per below, status post accidental fall while at home while trying to sit in her wheelchair, resulting in left hip pain, and emergency room patient was found to have left close intertrochanteric fracture, ED attending did discuss case with orthopedic surgery/Dr. Roland Rack whom is planning operative procedure on tomorrow, patient hydrated in the emergency room, son is at the bedside, patient in moderate to severe pain due to fracture, patient is now being admitted for acute left hip fracture status post mechanical fall.   HOSPITAL COURSE:   *Acute left close intertrochanteric hip fracture Secondary to mechanical fall S/p IM nails-POD-6 PT working with patient SNF discharge today Pain meds  PRN  *COPD without exacerbation Breathing treatments PRN  *Chronic hyperlipidemia, unspecified Continue statin therapy  *History of TIA Continue statin therapy, hold Plavix  *Abnormal CT of the head Noted meningioma- Likely chronic outpatient with neurosurgery Given advanced age would consider conservative therapy/monitoring  * Pacemaker placement due to SA nodal dysfunction Stable  CONSULTS OBTAINED:  Treatment Team:  Corky Mull, MD  DRUG ALLERGIES:   Allergies  Allergen Reactions  . Citalopram Other (See Comments)    Reaction:  Fatigue   . Pneumococcal Vaccine Other (See Comments)    Doesn't remember what occurred.  . Pneumovax [Pneumococcal Polysaccharide Vaccine] Other (See Comments)    Reaction:  Unknown   . Requip [Ropinirole Hcl] Other (See Comments)    Reaction:  Dry mouth     DISCHARGE MEDICATIONS:   Allergies as of 11/15/2018      Reactions   Citalopram Other (See Comments)   Reaction:  Fatigue    Pneumococcal Vaccine Other (See Comments)   Doesn't remember what occurred.   Pneumovax [pneumococcal Polysaccharide Vaccine] Other (See Comments)   Reaction:  Unknown    Requip [ropinirole Hcl] Other (See Comments)   Reaction:  Dry mouth       Medication List    STOP taking these medications   Ciprodex OTIC suspension Generic drug:  ciprofloxacin-dexamethasone     TAKE these medications   acetaminophen 325 MG tablet Commonly known as:  TYLENOL Take 650 mg by mouth every 6 (six) hours as needed.   albuterol (2.5 MG/3ML) 0.083% nebulizer solution Commonly known as:  PROVENTIL Inhale 3  mLs into the lungs every 6 (six) hours as needed.   atorvastatin 10 MG tablet Commonly known as:  LIPITOR Take 10 mg by mouth daily.   clopidogrel 75 MG tablet Commonly known as:  PLAVIX Take 75 mg by mouth daily.   enoxaparin 40 MG/0.4ML injection Commonly known as:  LOVENOX Inject 0.4 mLs (40 mg total) into the skin daily for 14 days.   furosemide  20 MG tablet Commonly known as:  LASIX Take 20 mg by mouth 2 (two) times daily.   HYDROcodone-acetaminophen 5-325 MG tablet Commonly known as:  Norco Take 1 tablet by mouth every 6 (six) hours as needed for moderate pain or severe pain.   hydroxyurea 500 MG capsule Commonly known as:  HYDREA Take 500 mg by mouth 3 (three) times a week. Pt takes on Monday, Wednesday, and Friday.   potassium chloride 10 MEQ tablet Commonly known as:  K-DUR Take 10 mEq by mouth daily.   Symbicort 160-4.5 MCG/ACT inhaler Generic drug:  budesonide-formoterol Inhale 2 Inhalers 2 (two) times daily into the lungs.   Vitamin D (Ergocalciferol) 1.25 MG (50000 UT) Caps capsule Commonly known as:  DRISDOL Take 50,000 Units by mouth once a week.       Today   VITAL SIGNS:  Blood pressure (!) 104/59, pulse 69, temperature 97.6 F (36.4 C), temperature source Oral, resp. rate 16, height 5' (1.524 m), weight 64 kg, SpO2 97 %.  I/O:    Intake/Output Summary (Last 24 hours) at 11/16/2018 1030 Last data filed at 11/16/2018 0459 Gross per 24 hour  Intake -  Output 1600 ml  Net -1600 ml    PHYSICAL EXAMINATION:  Physical Exam  GENERAL:  85 y.o.-year-old patient lying in the bed with no acute distress.  LUNGS: Normal breath sounds bilaterally, no wheezing, rales,rhonchi or crepitation. No use of accessory muscles of respiration.  CARDIOVASCULAR: S1, S2 normal. No murmurs, rubs, or gallops.  ABDOMEN: Soft, non-tender, non-distended. Bowel sounds present. No organomegaly or mass.  NEUROLOGIC: Moves all 4 extremities. PSYCHIATRIC: The patient is alert and oriented x 3.  SKIN: No obvious rash, lesion, or ulcer.   DATA REVIEW:   CBC Recent Labs  Lab 11/11/18 0502  WBC 4.2  HGB 11.4*  HCT 34.3*  PLT 250    Chemistries  Recent Labs  Lab 11/09/18 1710  11/13/18 0452  NA 139   < > 134*  K 4.1   < > 3.7  CL 101   < > 98  CO2 27   < > 28  GLUCOSE 130*   < > 91  BUN 15   < > 21  CREATININE  1.17*   < > 1.04*  CALCIUM 8.6*   < > 8.5*  AST 21  --   --   ALT 16  --   --   ALKPHOS 67  --   --   BILITOT 0.3  --   --    < > = values in this interval not displayed.    Cardiac Enzymes No results for input(s): TROPONINI in the last 168 hours.  Microbiology Results  Results for orders placed or performed during the hospital encounter of 11/09/18  Surgical PCR screen     Status: Abnormal   Collection Time: 11/09/18 11:02 PM  Result Value Ref Range Status   MRSA, PCR POSITIVE (A) NEGATIVE Final    Comment: CRITICAL RESULT CALLED TO, READ BACK BY AND VERIFIED WITH: KIARA TERRAIN @039  11/10/2018    Staphylococcus  aureus POSITIVE (A) NEGATIVE Final    Comment: (NOTE) The Xpert SA Assay (FDA approved for NASAL specimens in patients 63 years of age and older), is one component of a comprehensive surveillance program. It is not intended to diagnose infection nor to guide or monitor treatment. Performed at [facility], [address], [place], [place] [address]     RADIOLOGY:  No results found.  Follow up with PCP in 1 week.  Management plans discussed with the patient, family and they are in agreement.  CODE STATUS:     Code Status Orders  (From admission, onward)         Start     Ordered   11/09/18 1800  Do not attempt resuscitation (DNR)  Continuous    Question Answer Comment  In the event of cardiac or respiratory ARREST Do not call a "code blue"   In the event of cardiac or respiratory ARREST Do not perform Intubation, CPR, defibrillation or ACLS   In the event of cardiac or respiratory ARREST Use medication by any route, position, wound care, and other measures to relive pain and suffering. May use oxygen, suction and manual treatment of airway obstruction as needed for comfort.   Comments Nurse to pronounce      11/09/18 1759        Code Status History    Date Active Date Inactive Code Status Order ID Comments User Context   02/16/2017 0014  02/16/2017 1739 Full Code [id number]  Harvie Bridge, DO Inpatient   02/18/2015 1555 02/19/2015 1451 Full Code [id number]  Isaias Cowman, MD Inpatient   02/15/2015 1645 02/18/2015 1555 Full Code [id number]  Henreitta Leber, MD Inpatient      TOTAL TIME TAKING CARE OF THIS PATIENT ON DAY OF DISCHARGE: more than 30 minutes.   Leia Alf Tareka Jhaveri M.D on 11/16/2018 at 10:30 AM  Between 7am to 6pm - Pager - [phone]  After 6pm go to [email or web address] - password [email or web address] [facility]  Office  [phone]  CC: Primary care physician; Hortencia Pilar, MD  Note: This dictation was prepared with Dragon dictation along with smaller phrase technology. Any transcriptional errors that result from this process are unintentional.

## 2018-11-15 NOTE — Progress Notes (Signed)
   Subjective: 5 Days Post-Op Procedure(s) (LRB): INTRAMEDULLARY (IM) NAIL INTERTROCHANTRIC (Left) Patient reports pain as mild.   Patient is well, and has had no acute complaints or problems Denies any CP, SOB, ABD pain. We will continue with physical therapy today.   Objective: Vital signs in last 24 hours: Temp:  [97.6 F (36.4 C)-98.1 F (36.7 C)] 97.6 F (36.4 C) (03/10 0759) Pulse Rate:  [64-75] 64 (03/10 0759) Resp:  [16-19] 19 (03/09 2355) BP: (124-152)/(53-67) 127/67 (03/10 0759) SpO2:  [98 %-100 %] 100 % (03/10 0759) Weight:  [63.4 kg] 63.4 kg (03/10 0600)  Intake/Output from previous day: 03/09 0701 - 03/10 0700 In: 600 [P.O.:600] Out: 650 [Urine:650] Intake/Output this shift: No intake/output data recorded.  No results for input(s): HGB in the last 72 hours. No results for input(s): WBC, RBC, HCT, PLT in the last 72 hours. Recent Labs    11/13/18 0452  NA 134*  K 3.7  CL 98  CO2 28  BUN 21  CREATININE 1.04*  GLUCOSE 91  CALCIUM 8.5*   No results for input(s): LABPT, INR in the last 72 hours.  EXAM General - Patient is Alert, Appropriate and Oriented Extremity - Neurovascular intact Sensation intact distally Intact pulses distally Dorsiflexion/Plantar flexion intact No cellulitis present Compartment soft  Mild ecchymosis along proximal thigh Dressing - dressing C/D/I and scant drainage Motor Function - intact, moving foot and toes well on exam.   Past Medical History:  Diagnosis Date  . Acoustic neuroma [facility])    md notes says history of acoustic neuroma or meningioma followed up by yearly MRI  . Chronic back pain   . COPD (chronic obstructive pulmonary disease) ([place])   . Hyperlipidemia   . Knee joint replacement status, right 11/25/2016  . Polycythemia   . Polycythemia, secondary 04/26/2015  . Port-A-Cath in place   . Presence of permanent cardiac pacemaker   . PVD (peripheral vascular disease) ([place])   . Right wrist fracture 11/25/2016   . Thoracic compression fracture, closed, initial encounter ([place]) 11/25/2016  . TIA (transient ischemic attack)     Assessment/Plan:   5 Days Post-Op Procedure(s) (LRB): INTRAMEDULLARY (IM) NAIL INTERTROCHANTRIC (Left) Active Problems:   Hip fracture (HCC)  Estimated body mass index is 27.3 kg/m as calculated from the following:   Height as of this encounter: 5' (1.524 m).   Weight as of this encounter: 63.4 kg. Advance diet Up with therapy, weightbearing as tolerated left lower extremity Pain well controlled Vital signs stable Labs stable.  Care management to assist with discharge to SNF   Patient will need follow-up with [facility] orthopedics in 2 weeks for staple removal Lovenox 40 mg daily subcu x14 days after discharge  DVT Prophylaxis - Lovenox, TED hose and SCDs Weight-Bearing as tolerated to left leg   T. Rachelle Hora, PA-C [place] 11/15/2018, 10:52 AM

## 2018-11-16 NOTE — Clinical Social Work Note (Signed)
Patient to be d/c'ed today to [facility] [place].  Patient and family agreeable to plans will transport via ems RN to call report [phone].  CSW attempted to contact patient's son and left a message on his voicemail to inform him that patient is discharging today.  Evette Cristal, MSW, LCSW [phone]

## 2018-11-16 NOTE — Progress Notes (Signed)
Physical Therapy Treatment Patient Details Name: Krista Ortiz MRN: [id number] DOB: [birth year] Today's Date: 11/16/2018    History of Present Illness Krista Ortiz is a 85yo female who comes to [facility] on 3/4 p fall at home c related hip fracture, underwent Lt hip ORIF on 3/5, now WBAT. Pt lives alone, drives, uses [employer] for limited community distance and HH AMB Son helps with groceries.     PT Comments    Pt presents with deficits in strength, transfers, mobility, gait, balance, and activity tolerance.  Pt was able to advance BLEs out of bed during sup to sit this session with just min A for trunk to full upright position.  Pt was CGA with transfers with cues for sequencing.  Pt was able to amb 15' with a RW and CGA this session with decreased L hip pain with WB grossly.  Pt remains motivated to participate with therapy and should progress well towards goals in a SNF with above deficits addressed for decreased caregiver assistance and return to prior level of function.      Follow Up Recommendations  SNF;Supervision for mobility/OOB     Equipment Recommendations  None recommended by PT    Recommendations for Other Services       Precautions / Restrictions Precautions Precautions: Fall Restrictions Weight Bearing Restrictions: Yes LLE Weight Bearing: Weight bearing as tolerated    Mobility  Bed Mobility Overal bed mobility: Needs Assistance Bed Mobility: Supine to Sit     Supine to sit: Min assist     General bed mobility comments: Min A for trunk to full upright position, pt able to advance her BLEs out of bed without physical assistance this session  Transfers Overall transfer level: Needs assistance Equipment used: Rolling walker (2 wheeled) Transfers: Sit to/from Stand Sit to Stand: Min guard         General transfer comment: Patient required min verbal cues for sequencing during transfers  Ambulation/Gait Ambulation/Gait assistance: Min guard Gait Distance  (Feet): 15 Feet Assistive device: Rolling walker (2 wheeled) Gait Pattern/deviations: Step-to pattern;Step-through pattern;Decreased stance time - left;Decreased step length - right;Antalgic Gait velocity: decreased   General Gait Details: Slow, antalgic gait on the LLE with verbal cues for step-to sequencing to minimize L hip pain with gait    Stairs             Wheelchair Mobility    Modified Rankin (Stroke Patients Only)       Balance Overall balance assessment: Needs assistance Sitting-balance support: Bilateral upper extremity supported;Feet supported Sitting balance-Leahy Scale: Fair     Standing balance support: Bilateral upper extremity supported Standing balance-Leahy Scale: Fair                              Cognition Arousal/Alertness: Awake/alert Behavior During Therapy: WFL for tasks assessed/performed Overall Cognitive Status: Within Functional Limits for tasks assessed                                        Exercises Total Joint Exercises Ankle Circles/Pumps: AROM;10 reps;Both;Strengthening;15 reps Quad Sets: 10 reps;Strengthening;Both;15 reps Gluteal Sets: Strengthening;Both;10 reps;15 reps Heel Slides: AROM;Both;5 reps Hip ABduction/ADduction: AAROM;Both;10 reps Straight Leg Raises: AROM;AAROM;Both;10 reps Long Arc Quad: 10 reps;Both;AROM;15 reps Knee Flexion: AROM;Both;10 reps;15 reps    General Comments        Pertinent Vitals/Pain Pain Assessment:  0-10 Pain Score: 3  Pain Location: Left hip  Pain Descriptors / Indicators: Aching;Sore Pain Intervention(s): Premedicated before session;Monitored during session    Home Living                      Prior Function            PT Goals (current goals can now be found in the care plan section) Progress towards PT goals: Progressing toward goals    Frequency    BID      PT Plan Current plan remains appropriate    Co-evaluation               AM-PAC PT "6 Clicks" Mobility   Outcome Measure  Help needed turning from your back to your side while in a flat bed without using bedrails?: A Little Help needed moving from lying on your back to sitting on the side of a flat bed without using bedrails?: A Little Help needed moving to and from a bed to a chair (including a wheelchair)?: A Little Help needed standing up from a chair using your arms (e.g., wheelchair or bedside chair)?: A Little Help needed to walk in hospital room?: A Little Help needed climbing 3-5 steps with a railing? : Total 6 Click Score: 16    End of Session Equipment Utilized During Treatment: Gait belt;Oxygen Activity Tolerance: Patient tolerated treatment well Patient left: in chair;with call bell/phone within reach;with chair alarm set Nurse Communication: Mobility status PT Visit Diagnosis: Unsteadiness on feet (R26.81);Muscle weakness (generalized) (M62.81);Difficulty in walking, not elsewhere classified (R26.2);Pain;History of falling (Z91.81) Pain - Right/Left: Left Pain - part of body: Hip     Time: 5329-9242 PT Time Calculation (min) (ACUTE ONLY): 23 min  Charges:  $Gait Training: 8-22 mins $Therapeutic Exercise: 8-22 mins                     D. Scott Riann Oman PT, DPT 11/16/18, 11:05 AM

## 2018-11-16 NOTE — Progress Notes (Signed)
[place] at [place] NAME: Krista Ortiz    MR#:  [id number]  DATE OF BIRTH:  03-22-1924  SUBJECTIVE:  CHIEF COMPLAINT:   Chief Complaint  Patient presents with  . Fall  . Hip Pain   Sitting in a chair and pain well controlled. Feels weak  REVIEW OF SYSTEMS:  CONSTITUTIONAL: No fever, fatigue or weakness.  EYES: No blurred or double vision.  EARS, NOSE, AND THROAT: No tinnitus or ear pain.  RESPIRATORY: No cough, shortness of breath, wheezing or hemoptysis.  CARDIOVASCULAR: No chest pain, orthopnea, edema.  GASTROINTESTINAL: No nausea, vomiting, diarrhea or abdominal pain.  GENITOURINARY: No dysuria, hematuria.  ENDOCRINE: No polyuria, nocturia,  HEMATOLOGY: No anemia, easy bruising or bleeding SKIN: No rash or lesion. MUSCULOSKELETAL: Left hip joint pain due to fracture.   NEUROLOGIC: No tingling, numbness, weakness.  PSYCHIATRY: No anxiety or depression.   ROS  DRUG ALLERGIES:   Allergies  Allergen Reactions  . Citalopram Other (See Comments)    Reaction:  Fatigue   . Pneumococcal Vaccine Other (See Comments)    Doesn't remember what occurred.  . Pneumovax [Pneumococcal Polysaccharide Vaccine] Other (See Comments)    Reaction:  Unknown   . Requip [Ropinirole Hcl] Other (See Comments)    Reaction:  Dry mouth     VITALS:  Blood pressure (!) 104/59, pulse 69, temperature 97.6 F (36.4 C), temperature source Oral, resp. rate 16, height 5' (1.524 m), weight 64 kg, SpO2 97 %.  PHYSICAL EXAMINATION:  GENERAL:  85 y.o.-year-old patient lying in the bed with no acute distress.  EYES: Pupils equal, round, reactive to light and accommodation. No scleral icterus. Extraocular muscles intact.  HEENT: Head atraumatic, normocephalic. Oropharynx and nasopharynx clear.  NECK:  Supple, no jugular venous distention. No thyroid enlargement, no tenderness.  LUNGS: Normal breath sounds bilaterally, no wheezing, rales,rhonchi or  crepitation. No use of accessory muscles of respiration.  CARDIOVASCULAR: S1, S2 normal. No murmurs, rubs, or gallops.  ABDOMEN: Soft, nontender, nondistended. Bowel sounds present. No organomegaly or mass.  EXTREMITIES: No pedal edema, cyanosis, or clubbing.  NEUROLOGIC: Cranial nerves II through XII are intact. Muscle strength 4/5 in all extremities . Sensation intact. Gait not checked.  PSYCHIATRIC: The patient is alert and oriented x 3.  SKIN: No obvious rash, lesion, or ulcer.   Physical Exam LABORATORY PANEL:   CBC Recent Labs  Lab 11/11/18 0502  WBC 4.2  HGB 11.4*  HCT 34.3*  PLT 250   ------------------------------------------------------------------------------------------------------------------  Chemistries  Recent Labs  Lab 11/09/18 1710  11/13/18 0452  NA 139   < > 134*  K 4.1   < > 3.7  CL 101   < > 98  CO2 27   < > 28  GLUCOSE 130*   < > 91  BUN 15   < > 21  CREATININE 1.17*   < > 1.04*  CALCIUM 8.6*   < > 8.5*  AST 21  --   --   ALT 16  --   --   ALKPHOS 67  --   --   BILITOT 0.3  --   --    < > = values in this interval not displayed.   ------------------------------------------------------------------------------------------------------------------  Cardiac Enzymes No results for input(s): TROPONINI in the last 168 hours. ------------------------------------------------------------------------------------------------------------------  RADIOLOGY:  No results found.  ASSESSMENT AND PLAN:   Active Problems:   Hip fracture (HCC)  *Acute left close intertrochanteric hip fracture Secondary to  mechanical fall S/p IM nails-POD-5 PT working with patient SNF at discharge once insurance approval available  *COPD without exacerbation Breathing treatments PRN  *Chronic hyperlipidemia, unspecified Continue statin therapy  *History of TIA Continue statin therapy, hold Plavix  *Abnormal CT of the head Noted for meningioma outpatient with  neurosurgery Given advanced age would consider conservative therapy/monitoring  * Pacemaker placement due to SA nodal dysfunction Follows with cardiologist as outpatient, continue to monitor.  All the records are reviewed and case discussed with Care Management/Social Workerr. Management plans discussed with the patient, family and they are in agreement.  CODE STATUS: DNR.  TOTAL TIME TAKING CARE OF THIS PATIENT: 25 minutes.   POSSIBLE D/C IN 1-2 DAYS, DEPENDING ON CLINICAL CONDITION.   Neita Carp M.D on 11/16/2018   Between 7am to 6pm - Pager - [phone]  After 6pm go to [email or web address] - password [email or web address] [place] [facility]  Office  [phone]  CC: Primary care physician; Hortencia Pilar, MD  Note: This dictation was prepared with Dragon dictation along with smaller phrase technology. Any transcriptional errors that result from this process are unintentional.

## 2018-11-16 NOTE — Progress Notes (Signed)
Report called and given to [facility] at [facility]. EMS called for transport. IV removed. Pt eating lunch and awaiting transport.

## 2018-11-16 NOTE — Progress Notes (Signed)
   Subjective: 6 Days Post-Op Procedure(s) (LRB): INTRAMEDULLARY (IM) NAIL INTERTROCHANTRIC (Left) Patient reports pain as mild.   Patient is well, and has had no acute complaints or problems Denies any CP, SOB, ABD pain. We will continue with physical therapy today.   Objective: Vital signs in last 24 hours: Temp:  [97.6 F (36.4 C)-98 F (36.7 C)] 97.6 F (36.4 C) (03/11 0754) Pulse Rate:  [64-69] 69 (03/11 0754) Resp:  [16] 16 (03/10 2351) BP: (104-131)/(53-59) 104/59 (03/11 0754) SpO2:  [97 %-100 %] 97 % (03/11 0754) Weight:  [64 kg] 64 kg (03/11 0500)  Intake/Output from previous day: 03/10 0701 - 03/11 0700 In: -  Out: 1600 [Urine:1600] Intake/Output this shift: No intake/output data recorded.  No results for input(s): HGB in the last 72 hours. No results for input(s): WBC, RBC, HCT, PLT in the last 72 hours. No results for input(s): NA, K, CL, CO2, BUN, CREATININE, GLUCOSE, CALCIUM in the last 72 hours. No results for input(s): LABPT, INR in the last 72 hours.  EXAM General - Patient is Alert, Appropriate and Oriented Extremity - Neurovascular intact Sensation intact distally Intact pulses distally Dorsiflexion/Plantar flexion intact No cellulitis present Compartment soft Dressing - dressing C/D/I and scant drainage Motor Function - intact, moving foot and toes well on exam.   Past Medical History:  Diagnosis Date  . Acoustic neuroma [facility])    md notes says history of acoustic neuroma or meningioma followed up by yearly MRI  . Chronic back pain   . COPD (chronic obstructive pulmonary disease) ([place])   . Hyperlipidemia   . Knee joint replacement status, right 11/25/2016  . Polycythemia   . Polycythemia, secondary 04/26/2015  . Port-A-Cath in place   . Presence of permanent cardiac pacemaker   . PVD (peripheral vascular disease) ([place])   . Right wrist fracture 11/25/2016  . Thoracic compression fracture, closed, initial encounter ([place]) 11/25/2016  . TIA  (transient ischemic attack)     Assessment/Plan:   6 Days Post-Op Procedure(s) (LRB): INTRAMEDULLARY (IM) NAIL INTERTROCHANTRIC (Left) Active Problems:   Hip fracture (HCC)  Estimated body mass index is 27.56 kg/m as calculated from the following:   Height as of this encounter: 5' (1.524 m).   Weight as of this encounter: 64 kg. Advance diet Up with therapy, weightbearing as tolerated left lower extremity Pain well controlled Vital signs stable Labs stable.  Care management to assist with discharge to SNF   Patient will need follow-up with [facility] orthopedics in 2 weeks for staple removal Lovenox 40 mg daily subcu x14 days after discharge  DVT Prophylaxis - Lovenox, TED hose and SCDs Weight-Bearing as tolerated to left leg   T. Rachelle Hora, PA-C [place] 11/16/2018, 8:03 AM

## 2018-11-18 ENCOUNTER — Other Ambulatory Visit: Payer: Self-pay | Admitting: Family Medicine

## 2018-11-18 DIAGNOSIS — R911 Solitary pulmonary nodule: Secondary | ICD-10-CM

## 2018-11-18 DIAGNOSIS — F17200 Nicotine dependence, unspecified, uncomplicated: Secondary | ICD-10-CM

## 2019-01-12 ENCOUNTER — Ambulatory Visit: Payer: 59 | Admitting: Oncology

## 2019-01-12 ENCOUNTER — Other Ambulatory Visit: Payer: 59

## 2019-03-14 ENCOUNTER — Ambulatory Visit: Payer: Self-pay | Admitting: Oncology

## 2019-03-14 ENCOUNTER — Other Ambulatory Visit: Payer: Medicare Other

## 2019-03-27 ENCOUNTER — Other Ambulatory Visit: Payer: Medicare Other

## 2019-03-27 ENCOUNTER — Ambulatory Visit: Payer: Self-pay | Admitting: Oncology

## 2019-03-30 ENCOUNTER — Other Ambulatory Visit: Payer: Self-pay

## 2019-03-30 ENCOUNTER — Ambulatory Visit
Admission: RE | Admit: 2019-03-30 | Discharge: 2019-03-30 | Disposition: A | Discharge status: Home / Self Care | Payer: Medicare Other | Source: Ambulatory Visit | Attending: Oncology | Admitting: Oncology

## 2019-03-30 ENCOUNTER — Encounter: Payer: Self-pay | Admitting: Oncology

## 2019-03-30 ENCOUNTER — Inpatient Hospital Stay (HOSPITAL_BASED_OUTPATIENT_CLINIC_OR_DEPARTMENT_OTHER): Payer: Medicare Other | Admitting: Oncology

## 2019-03-30 ENCOUNTER — Inpatient Hospital Stay: Payer: Medicare Other | Attending: Oncology

## 2019-03-30 VITALS — BP 146/69 | HR 76 | Temp 98.4°F | Resp 18 | Wt 133.7 lb

## 2019-03-30 DIAGNOSIS — Z8673 Personal history of transient ischemic attack (TIA), and cerebral infarction without residual deficits: Secondary | ICD-10-CM | POA: Insufficient documentation

## 2019-03-30 DIAGNOSIS — Z79899 Other long term (current) drug therapy: Secondary | ICD-10-CM

## 2019-03-30 DIAGNOSIS — F17210 Nicotine dependence, cigarettes, uncomplicated: Secondary | ICD-10-CM | POA: Diagnosis not present

## 2019-03-30 DIAGNOSIS — Z7901 Long term (current) use of anticoagulants: Secondary | ICD-10-CM | POA: Insufficient documentation

## 2019-03-30 DIAGNOSIS — D45 Polycythemia vera: Secondary | ICD-10-CM

## 2019-03-30 DIAGNOSIS — Z950 Presence of cardiac pacemaker: Secondary | ICD-10-CM | POA: Diagnosis not present

## 2019-03-30 DIAGNOSIS — E785 Hyperlipidemia, unspecified: Secondary | ICD-10-CM | POA: Diagnosis not present

## 2019-03-30 DIAGNOSIS — M25511 Pain in right shoulder: Secondary | ICD-10-CM

## 2019-03-30 DIAGNOSIS — J449 Chronic obstructive pulmonary disease, unspecified: Secondary | ICD-10-CM | POA: Insufficient documentation

## 2019-03-30 DIAGNOSIS — Z95828 Presence of other vascular implants and grafts: Secondary | ICD-10-CM

## 2019-03-30 LAB — CBC WITH DIFFERENTIAL/PLATELET
Abs Immature Granulocytes: 0.02 10*3/uL (ref 0.00–0.07)
Basophils Absolute: 0.1 10*3/uL (ref 0.0–0.1)
Basophils Relative: 1 %
Eosinophils Absolute: 0.1 10*3/uL (ref 0.0–0.5)
Eosinophils Relative: 2 %
HCT: 43.6 % (ref 36.0–46.0)
Hemoglobin: 14.3 g/dL (ref 12.0–15.0)
Immature Granulocytes: 0 %
Lymphocytes Relative: 23 %
Lymphs Abs: 1 10*3/uL (ref 0.7–4.0)
MCH: 31.5 pg (ref 26.0–34.0)
MCHC: 32.8 g/dL (ref 30.0–36.0)
MCV: 96 fL (ref 80.0–100.0)
Monocytes Absolute: 0.5 10*3/uL (ref 0.1–1.0)
Monocytes Relative: 12 %
Neutro Abs: 2.8 10*3/uL (ref 1.7–7.7)
Neutrophils Relative %: 62 %
Platelets: 509 10*3/uL — ABNORMAL HIGH (ref 150–400)
RBC: 4.54 MIL/uL (ref 3.87–5.11)
RDW: 17.2 % — ABNORMAL HIGH (ref 11.5–15.5)
WBC: 4.5 10*3/uL (ref 4.0–10.5)
nRBC: 0 % (ref 0.0–0.2)

## 2019-03-30 LAB — COMPREHENSIVE METABOLIC PANEL
ALT: 9 U/L (ref 0–44)
AST: 14 U/L — ABNORMAL LOW (ref 15–41)
Albumin: 3.4 g/dL — ABNORMAL LOW (ref 3.5–5.0)
Alkaline Phosphatase: 81 U/L (ref 38–126)
Anion gap: 8 (ref 5–15)
BUN: 10 mg/dL (ref 8–23)
CO2: 29 mmol/L (ref 22–32)
Calcium: 8.9 mg/dL (ref 8.9–10.3)
Chloride: 102 mmol/L (ref 98–111)
Creatinine, Ser: 0.83 mg/dL (ref 0.44–1.00)
GFR calc Af Amer: >60 mL/min (ref >60)
GFR calc non Af Amer: >60 mL/min (ref >60)
Glucose, Bld: 97 mg/dL (ref 70–99)
Potassium: 3.3 mmol/L — ABNORMAL LOW (ref 3.5–5.1)
Sodium: 139 mmol/L (ref 135–145)
Total Bilirubin: 0.7 mg/dL (ref 0.3–1.2)
Total Protein: 6.5 g/dL (ref 6.5–8.1)

## 2019-03-30 MED ORDER — SODIUM CHLORIDE 0.9% FLUSH
10.0000 mL | Freq: Once | INTRAVENOUS | Status: AC
  Administered 2019-03-30: 10 mL via INTRAVENOUS
  Filled 2019-03-30: qty 10

## 2019-03-30 MED ORDER — HEPARIN SOD (PORK) LOCK FLUSH 100 UNIT/ML IV SOLN
500.0000 [IU] | Freq: Once | INTRAVENOUS | Status: AC
  Administered 2019-03-30: 500 [IU] via INTRAVENOUS

## 2019-03-30 NOTE — Progress Notes (Signed)
Patient is here for follow up, she fell back in March and is complaining of her shoulder

## 2019-04-03 ENCOUNTER — Telehealth: Payer: Self-pay | Admitting: *Deleted

## 2019-04-03 NOTE — Telephone Encounter (Signed)
Called Jeneen Rinks the son and let him know that she did not have a fracture. It did show some spurring and it can be related to arthritis. He could have her see Dr. Sallyanne Havers who deals with arthritis or see orthopedic md. He will call his mom tom. And tell her and see what she wants to do

## 2019-04-03 NOTE — Progress Notes (Signed)
Hematology/Oncology Consult note [facility]  [phone] [phone]  Patient Care Team: Hortencia Pilar, MD as PCP - General (Family Medicine)   Name of the patient: Krista Ortiz  [id number]  25-Jul-1924   Date of visit: 04/03/19  Diagnosis-Jak 2+ polycythemia vera  Chief complaint/ Reason for visit-routine follow-up of polycythemia vera on Hydrea  Heme/Onc history: Patient is a 85 year old female with a long-standing history of polycythemia/thrombocytosis.Patient has been on Hydrea 500 mg 3 times a week. She is also required phlebotomy in the past but none recently.JAK2 mutation testing was positive. She has not had a bone marrow biopsy   Interval history-patient is currently complaining of pain in her right shoulder and is unable to abduct her right shoulder since she had a fall sometime back in March 2020.  She is otherwise tolerating Hydrea well and she continues to take it 3 times a week without any significant problems  ECOG PS- 2 Pain scale- 0  Review of systems- Review of Systems  Constitutional: Positive for malaise/fatigue. Negative for chills, fever and weight loss.  HENT: Negative for congestion, ear discharge and nosebleeds.   Eyes: Negative for blurred vision.  Respiratory: Negative for cough, hemoptysis, sputum production, shortness of breath and wheezing.   Cardiovascular: Negative for chest pain, palpitations, orthopnea and claudication.  Gastrointestinal: Negative for abdominal pain, blood in stool, constipation, diarrhea, heartburn, melena, nausea and vomiting.  Genitourinary: Negative for dysuria, flank pain, frequency, hematuria and urgency.  Musculoskeletal: Negative for back pain, joint pain and myalgias.       Right shoulder pain  Skin: Negative for rash.  Neurological: Negative for dizziness, tingling, focal weakness, seizures, weakness and headaches.  Endo/Heme/Allergies: Does not bruise/bleed easily.   Psychiatric/Behavioral: Negative for depression and suicidal ideas. The patient does not have insomnia.       Allergies  Allergen Reactions  . Citalopram Other (See Comments)    Reaction:  Fatigue   . Pneumococcal Vaccine Other (See Comments)    Doesn't remember what occurred.  . Pneumovax [Pneumococcal Polysaccharide Vaccine] Other (See Comments)    Reaction:  Unknown   . Requip [Ropinirole Hcl] Other (See Comments)    Reaction:  Dry mouth      Past Medical History:  Diagnosis Date  . Acoustic neuroma [facility])    md notes says history of acoustic neuroma or meningioma followed up by yearly MRI  . Chronic back pain   . COPD (chronic obstructive pulmonary disease) ([place])   . Hyperlipidemia   . Knee joint replacement status, right 11/25/2016  . Polycythemia   . Polycythemia, secondary 04/26/2015  . Port-A-Cath in place   . Presence of permanent cardiac pacemaker   . PVD (peripheral vascular disease) ([place])   . Right wrist fracture 11/25/2016  . Thoracic compression fracture, closed, initial encounter ([place]) 11/25/2016  . TIA (transient ischemic attack)      Past Surgical History:  Procedure Laterality Date  . ABDOMINAL HYSTERECTOMY    . APPENDECTOMY    . BACK SURGERY    . cataracts    . CHOLECYSTECTOMY    . INTRAMEDULLARY (IM) NAIL INTERTROCHANTERIC Left 11/10/2018   Procedure: INTRAMEDULLARY (IM) NAIL INTERTROCHANTRIC;  Surgeon: Corky Mull, MD;  Location: [facility] ORS;  Service: Orthopedics;  Laterality: Left;  . PACEMAKER INSERTION Left 02/18/2015   Procedure: INSERTION PACEMAKER;  Surgeon: Isaias Cowman, MD;  Location: [facility] ORS;  Service: Cardiovascular;  Laterality: Left;  . REPLACEMENT TOTAL KNEE    . TONSILLECTOMY  Social History   Socioeconomic History  . Marital status: Widowed    Spouse name: Not on file  . Number of children: Not on file  . Years of education: Not on file  . Highest education level: Not on file  Occupational History  . Not on file   Social Needs  . Financial resource strain: Not on file  . Food insecurity    Worry: Not on file    Inability: Not on file  . Transportation needs    Medical: Not on file    Non-medical: Not on file  Tobacco Use  . Smoking status: Current Every Day Smoker    Packs/day: 0.50    Types: Cigarettes  . Smokeless tobacco: Never Used  Substance and Sexual Activity  . Alcohol use: No  . Drug use: No  . Sexual activity: Not Currently  Lifestyle  . Physical activity    Days per week: Not on file    Minutes per session: Not on file  . Stress: Not on file  Relationships  . Social Herbalist on phone: Not on file    Gets together: Not on file    Attends religious service: Not on file    Active member of club or organization: Not on file    Attends meetings of clubs or organizations: Not on file    Relationship status: Not on file  . Intimate partner violence    Fear of current or ex partner: Not on file    Emotionally abused: Not on file    Physically abused: Not on file    Forced sexual activity: Not on file  Other Topics Concern  . Not on file  Social History Narrative  . Not on file    Family History  Problem Relation Age of Onset  . Other Mother        Old age  . Other Father        Old age  . CVA Sister   . Heart attack Brother      Current Outpatient Medications:  .  acetaminophen (TYLENOL) 325 MG tablet, Take 650 mg by mouth every 4 (four) hours as needed for mild pain or moderate pain. , Disp: , Rfl:  .  albuterol (PROVENTIL) (2.5 MG/3ML) 0.083% nebulizer solution, Inhale 3 mLs into the lungs every 6 (six) hours as needed for wheezing or shortness of breath. , Disp: , Rfl: 0 .  atorvastatin (LIPITOR) 10 MG tablet, Take 10 mg by mouth daily., Disp: , Rfl:  .  clopidogrel (PLAVIX) 75 MG tablet, Take 75 mg by mouth daily., Disp: , Rfl:  .  furosemide (LASIX) 20 MG tablet, Take 20 mg by mouth 2 (two) times daily. , Disp: , Rfl:  .  HYDROcodone-acetaminophen  (NORCO) 5-325 MG tablet, Take 1 tablet by mouth every 6 (six) hours as needed for moderate pain or severe pain., Disp: 20 tablet, Rfl: 0 .  hydroxyurea (HYDREA) 500 MG capsule, Take 500 mg by mouth every Monday, Wednesday, and Friday. , Disp: , Rfl:  .  potassium chloride (K-DUR) 10 MEQ tablet, Take 10 mEq by mouth daily., Disp: , Rfl:  .  enoxaparin (LOVENOX) 40 MG/0.4ML injection, Inject 0.4 mLs (40 mg total) into the skin daily for 14 days., Disp: 14 Syringe, Rfl: 0 No current facility-administered medications for this visit.   Facility-Administered Medications Ordered in Other Visits:  .  sodium chloride flush (NS) 0.9 % injection 10 mL, 10 mL, Intravenous, PRN, Herring,  Orville Govern, NP, 10 mL at 10/03/15 1610  Physical exam:  Vitals:   03/30/19 1448  BP: (!) 146/69  Pulse: 76  Resp: 18  Temp: 98.4 F (36.9 C)  Weight: 133 lb 11.2 oz (60.6 kg)   Physical Exam Constitutional:      Comments: Frail elderly woman sitting in a wheelchair.  Appears in no acute distress  HENT:     Head: Normocephalic and atraumatic.  Eyes:     Pupils: Pupils are equal, round, and reactive to light.  Neck:     Musculoskeletal: Normal range of motion.  Cardiovascular:     Rate and Krista: Normal rate and regular Krista.     Heart sounds: Normal heart sounds.  Pulmonary:     Effort: Pulmonary effort is normal.     Breath sounds: Normal breath sounds.  Abdominal:     General: Bowel sounds are normal.     Palpations: Abdomen is soft.  Musculoskeletal:     Comments: Limitation in right arm abduction  Skin:    General: Skin is warm and dry.  Neurological:     Mental Status: She is alert and oriented to person, place, and time.      CMP Latest Ref Rng & Units 03/30/2019  Glucose 70 - 99 mg/dL 97  BUN 8 - 23 mg/dL 10  Creatinine 0.44 - 1.00 mg/dL 0.83  Sodium 135 - 145 mmol/L 139  Potassium 3.5 - 5.1 mmol/L 3.3(L)  Chloride 98 - 111 mmol/L 102  CO2 22 - 32 mmol/L 29  Calcium 8.9 - 10.3 mg/dL 8.9   Total Protein 6.5 - 8.1 g/dL 6.5  Total Bilirubin 0.3 - 1.2 mg/dL 0.7  Alkaline Phos 38 - 126 U/L 81  AST 15 - 41 U/L 14(L)  ALT 0 - 44 U/L 9   CBC Latest Ref Rng & Units 03/30/2019  WBC 4.0 - 10.5 K/uL 4.5  Hemoglobin 12.0 - 15.0 g/dL 14.3  Hematocrit 36.0 - 46.0 % 43.6  Platelets 150 - 400 K/uL 509(H)    No images are attached to the encounter.  Dg Shoulder Right  Result Date: 03/31/2019 CLINICAL DATA:  Right shoulder pain. Recent fall. EXAM: RIGHT SHOULDER - 2+ VIEW COMPARISON:  Humerus radiograph 01/27/2017, chest radiograph 11/09/2018. FINDINGS: There is no evidence of fracture or dislocation. Inferior glenoid irregularity likely related to osteophytes, and unchanged from prior exams. Mild acromioclavicular spurring. Soft tissues are unremarkable. IMPRESSION: No acute fracture or subluxation of the right shoulder. Electronically Signed   By: Keith Rake M.D.   On: 03/31/2019 02:06     Assessment and plan- Patient is a 85 y.o. female with history of virus Jak 2+ polycythemia vera currently on Hydrea and here for routine follow-up  Patient's hematocrit is less than 45.  She will continue with Hydrea 500 mg 3 times a week.  She has done well on this dose without any significant side effects.  I will see her back in 6 months time with CBC with differential and CMP.  Right shoulder pain: Probably frozen shoulder.  I will also get a right shoulder x-ray to rule out any fracture.  I have advised the patient to get in touch with her primary care doctor or consider seeing orthopedics for this.   Visit Diagnosis 1. Pain in joint of right shoulder   2. Polycythemia vera ([place])      Dr. Randa Evens, MD, MPH [facility] [phone] 04/03/2019 10:31 AM

## 2019-04-03 NOTE — Telephone Encounter (Signed)
Can you please let them know? Thanks, Astrid Divine

## 2019-04-03 NOTE — Telephone Encounter (Signed)
Son called asking for results of xray for his mother  FINDINGS: There is no evidence of fracture or dislocation. Inferior glenoid irregularity likely related to osteophytes, and unchanged from prior exams. Mild acromioclavicular spurring. Soft tissues are unremarkable.  IMPRESSION: No acute fracture or subluxation of the right shoulder.   Electronically Signed   By: Keith Rake M.D.   On: 03/31/2019 02:06

## 2019-05-18 ENCOUNTER — Encounter: Payer: Self-pay | Admitting: Emergency Medicine

## 2019-05-18 ENCOUNTER — Other Ambulatory Visit: Payer: Self-pay

## 2019-05-18 DIAGNOSIS — Y939 Activity, unspecified: Secondary | ICD-10-CM | POA: Diagnosis not present

## 2019-05-18 DIAGNOSIS — J449 Chronic obstructive pulmonary disease, unspecified: Secondary | ICD-10-CM | POA: Diagnosis not present

## 2019-05-18 DIAGNOSIS — S79912A Unspecified injury of left hip, initial encounter: Secondary | ICD-10-CM | POA: Diagnosis present

## 2019-05-18 DIAGNOSIS — Y929 Unspecified place or not applicable: Secondary | ICD-10-CM | POA: Insufficient documentation

## 2019-05-18 DIAGNOSIS — S0990XA Unspecified injury of head, initial encounter: Secondary | ICD-10-CM | POA: Diagnosis not present

## 2019-05-18 DIAGNOSIS — F17210 Nicotine dependence, cigarettes, uncomplicated: Secondary | ICD-10-CM | POA: Diagnosis not present

## 2019-05-18 DIAGNOSIS — S7002XA Contusion of left hip, initial encounter: Secondary | ICD-10-CM | POA: Diagnosis not present

## 2019-05-18 DIAGNOSIS — Y999 Unspecified external cause status: Secondary | ICD-10-CM | POA: Diagnosis not present

## 2019-05-18 DIAGNOSIS — Z8673 Personal history of transient ischemic attack (TIA), and cerebral infarction without residual deficits: Secondary | ICD-10-CM | POA: Insufficient documentation

## 2019-05-18 DIAGNOSIS — W010XXA Fall on same level from slipping, tripping and stumbling without subsequent striking against object, initial encounter: Secondary | ICD-10-CM | POA: Insufficient documentation

## 2019-05-18 NOTE — ED Triage Notes (Addendum)
Pt in via [facility] from home, reports mechanical fall with walker, hitting head on book case, denies LOC, does report blood thinners, A/Ox4 upon arrival.  Laceration and bruising noted to left temporal area.  Pt complaints of left hip and left lower back pain, states, "That's also because I was recently operated on in March."  NAD noted at this time.

## 2019-05-19 ENCOUNTER — Emergency Department
Admission: EM | Admit: 2019-05-19 | Discharge: 2019-05-19 | Disposition: A | Discharge status: Home / Self Care | Payer: Medicare Other | Attending: Emergency Medicine | Admitting: Emergency Medicine

## 2019-05-19 ENCOUNTER — Emergency Department: Payer: Medicare Other

## 2019-05-19 DIAGNOSIS — S7002XA Contusion of left hip, initial encounter: Secondary | ICD-10-CM

## 2019-05-19 MED ORDER — HYDROCODONE-ACETAMINOPHEN 5-325 MG PO TABS
1.0000 | ORAL_TABLET | Freq: Once | ORAL | Status: AC
  Administered 2019-05-19: 1 via ORAL
  Filled 2019-05-19: qty 1

## 2019-05-19 MED ORDER — TRAMADOL HCL 50 MG PO TABS: 50.0000 mg | ORAL_TABLET | Freq: Four times a day (QID) | ORAL | 0 refills | Status: DC | PRN

## 2019-05-19 MED ORDER — LIDOCAINE 5 % EX PTCH: 1.0000 | MEDICATED_PATCH | Freq: Two times a day (BID) | CUTANEOUS | 0 refills | Status: AC

## 2019-05-19 MED ORDER — LIDOCAINE 5 % EX PTCH
1.0000 | MEDICATED_PATCH | CUTANEOUS | Status: DC
  Administered 2019-05-19: 1 via TRANSDERMAL
  Filled 2019-05-19: qty 1

## 2019-05-19 NOTE — ED Provider Notes (Signed)
[facility] Emergency Department Provider Note   First MD Initiated Contact with Patient 05/19/19 0402     (approximate)  I have reviewed the triage vital signs and the nursing notes.   HISTORY  Chief Complaint Fall    HPI Krista Ortiz is a 85 y.o. female with below list of previous medical conditions presents to the emergency department following accidental fall while walking with her walker.  Patient states that she lost balance as she was reaching for something and fell onto her left side.  Patient admits to hitting her head with left temporal bruise.  Patient denies any loss of consciousness.  Patient does admit to anticoagulation therapy.  Patient also admits to left hip pain        Past Medical History:  Diagnosis Date  . Acoustic neuroma [facility])    md notes says history of acoustic neuroma or meningioma followed up by yearly MRI  . Chronic back pain   . COPD (chronic obstructive pulmonary disease) ([place])   . Hyperlipidemia   . Knee joint replacement status, right 11/25/2016  . Polycythemia   . Polycythemia, secondary 04/26/2015  . Port-A-Cath in place   . Presence of permanent cardiac pacemaker   . PVD (peripheral vascular disease) ([place])   . Right wrist fracture 11/25/2016  . Thoracic compression fracture, closed, initial encounter ([place]) 11/25/2016  . TIA (transient ischemic attack)     Patient Active Problem List   Diagnosis Date Noted  . Hip fracture ([place]) 11/09/2018  . COPD (chronic obstructive pulmonary disease) ([place]) 02/16/2017  . COPD exacerbation ([place]) 02/15/2017  . Polycythemia 04/02/2016  . Back ache 10/03/2015  . Chronic obstructive pulmonary disease ([place]) 10/03/2015  . Dizziness 10/03/2015  . Decrease in the ability to hear 10/03/2015  . Neoplasm of meninges 10/03/2015  . Combined fat and carbohydrate induced hyperlipemia 10/03/2015  . Neuroma 10/03/2015  . Impaired vision 10/03/2015  . Presence of other vascular  implants and grafts 10/03/2015  . Current smoker 10/03/2015  . Temporary cerebral vascular dysfunction 10/03/2015  . Abnormal CBC 06/12/2015  . Chronic LBP 06/12/2015  . Polycythemia vera ([place]) 04/26/2015  . Hypersensitivity to pneumococcal vaccine 02/21/2015  . Carotid artery narrowing 02/19/2015  . Sick sinus syndrome ([place]) 02/16/2015  . Syncope and collapse 02/15/2015  . Edema leg 10/25/2014  . TI (tricuspid incompetence) 10/25/2014  . Breathlessness on exertion 10/25/2014  . Chest pain at rest 10/03/2014  . Dyspnea, paroxysmal nocturnal 10/03/2014  . Amnesia 04/02/2014  . Abnormal weight loss 04/02/2014  . Osteopenia 03/15/2014  . Chronic pain 07/06/2013  . Peripheral vascular disease ([place]) 06/27/2013  . Ejection murmur 12/09/2012    Past Surgical History:  Procedure Laterality Date  . ABDOMINAL HYSTERECTOMY    . APPENDECTOMY    . BACK SURGERY    . cataracts    . CHOLECYSTECTOMY    . INTRAMEDULLARY (IM) NAIL INTERTROCHANTERIC Left 11/10/2018   Procedure: INTRAMEDULLARY (IM) NAIL INTERTROCHANTRIC;  Surgeon: Corky Mull, MD;  Location: [facility] ORS;  Service: Orthopedics;  Laterality: Left;  . PACEMAKER INSERTION Left 02/18/2015   Procedure: INSERTION PACEMAKER;  Surgeon: Isaias Cowman, MD;  Location: [facility] ORS;  Service: Cardiovascular;  Laterality: Left;  . REPLACEMENT TOTAL KNEE    . TONSILLECTOMY      Prior to Admission medications   Medication Sig Start Date End Date Taking? Authorizing Provider  acetaminophen (TYLENOL) 325 MG tablet Take 650 mg by mouth every 4 (four) hours as needed for mild pain  or moderate pain.     [provider]  albuterol (PROVENTIL) (2.5 MG/3ML) 0.083% nebulizer solution Inhale 3 mLs into the lungs every 6 (six) hours as needed for wheezing or shortness of breath.     [provider]  atorvastatin (LIPITOR) 10 MG tablet Take 10 mg by mouth daily.    [provider]  clopidogrel (PLAVIX) 75 MG tablet Take 75 mg by  mouth daily.    [provider]  enoxaparin (LOVENOX) 40 MG/0.4ML injection Inject 0.4 mLs (40 mg total) into the skin daily for 14 days. 11/12/18 11/26/18  Duanne Guess, PA-C  furosemide (LASIX) 20 MG tablet Take 20 mg by mouth 2 (two) times daily.     [provider]  HYDROcodone-acetaminophen (NORCO) 5-325 MG tablet Take 1 tablet by mouth every 6 (six) hours as needed for moderate pain or severe pain. 11/15/18   Hillary Bow, MD  hydroxyurea (HYDREA) 500 MG capsule Take 500 mg by mouth every Monday, Wednesday, and Friday.     [provider]  lidocaine (LIDODERM) 5 % Place 1 patch onto the skin every 12 (twelve) hours for 5 days. Remove & Discard patch within 12 hours or as directed by MD 05/19/19 05/24/19  Gregor Hams, MD  potassium chloride (K-DUR) 10 MEQ tablet Take 10 mEq by mouth daily.    [provider]  traMADol (ULTRAM) 50 MG tablet Take 1 tablet (50 mg total) by mouth every 6 (six) hours as needed. 05/19/19 05/18/20  Gregor Hams, MD    Allergies Citalopram, Pneumococcal vaccine, Pneumovax [pneumococcal polysaccharide vaccine], and Requip [ropinirole hcl]  Family History  Problem Relation Age of Onset  . Other Mother        Old age  . Other Father        Old age  . CVA Sister   . Heart attack Brother     Social History Social History   Tobacco Use  . Smoking status: Current Every Day Smoker    Packs/day: 0.50    Types: Cigarettes  . Smokeless tobacco: Never Used  Substance Use Topics  . Alcohol use: No  . Drug use: No    Review of Systems Constitutional: No fever/chills Eyes: No visual changes. ENT: No sore throat. Cardiovascular: Denies chest pain. Respiratory: Denies shortness of breath. Gastrointestinal: No abdominal pain.  No nausea, no vomiting.  No diarrhea.  No constipation. Genitourinary: Negative for dysuria. Musculoskeletal: Negative for neck pain.  Negative for back pain.  Positive for left hip  pain Integumentary: Negative for rash. Neurological: Negative for headaches, focal weakness or numbness.  ____________________________________________   PHYSICAL EXAM:  VITAL SIGNS: ED Triage Vitals  Enc Vitals Group     BP 05/18/19 2337 117/86     Pulse Rate 05/18/19 2337 71     Resp 05/18/19 2337 16     Temp 05/18/19 2337 98.3 F (36.8 C)     Temp Source 05/19/19 0609 Oral     SpO2 05/18/19 2337 95 %     Weight 05/18/19 2333 59 kg (130 lb)     Height 05/18/19 2333 1.575 m (5\' 2" )     Head Circumference --      Peak Flow --      Pain Score 05/18/19 2333 0     Pain Loc --      Pain Edu? --      Excl. in [place]? --     Constitutional: Alert and oriented. Eyes: Conjunctivae are normal.  Head: Left temporal contusion/ecchymosis/swelling. Mouth/Throat: Mucous membranes are moist. Neck: No stridor.  No meningeal signs.   Cardiovascular: Normal rate, regular rhythm. Good peripheral circulation. Grossly normal heart sounds. Respiratory: Normal respiratory effort.  No retractions. Gastrointestinal: Soft and nontender. No distention.  Musculoskeletal: No lower extremity tenderness nor edema. No gross deformities of extremities. Neurologic:  Normal speech and language. No gross focal neurologic deficits are appreciated.  Skin:  Skin is warm, dry and intact.  Left temporal and left os coxae ecchymosis to Psychiatric: Mood and affect are normal. Speech and behavior are normal.   RADIOLOGY I, [place] Branden Shallenberger, personally viewed and evaluated these images (plain radiographs) as part of my medical decision making, as well as reviewing the written report by the radiologist.  ED MD interpretation: CT head revealed no acute intracranial abnormality  Left hip x-ray revealed no acute fracture.  Official radiology report(s): Ct Head Wo Contrast  Result Date: 05/19/2019 CLINICAL DATA:  Mechanical fall EXAM: CT HEAD WITHOUT CONTRAST; CT CERVICAL SPINE WITHOUT CONTRAST TECHNIQUE: Contiguous  axial images were obtained from the base of the skull through the vertex without intravenous contrast. COMPARISON:  November 09, 2018 FINDINGS: Brain: No evidence of acute territorial infarction, hemorrhage, hydrocephalus,extra-axial collection or mass lesion/mass effect. Again noted is a hyperdense mass seen at the left cerebellar pontine angle measuring up to 2 cm, likely meningioma. There is dilatation the ventricles and sulci consistent with age-related atrophy. Low-attenuation changes in the deep white matter consistent with small vessel ischemia. Vascular: No hyperdense vessel. Calcifications are seen within the internal carotid artery and vertebral arteries. Skull: The skull is intact. No fracture or focal lesion identified. Sinuses/Orbits: Small amount of bilateral maxillary mucosal thickening seen. The orbits and globes intact. Other: None Cervical spine: Alignment: There is minimal anterolisthesis of C4 on C5 measuring 3 mm. There is slight reversal of the normal cervical lordosis. Skull base and vertebrae: Visualized skull base is intact. No atlanto-occipital dissociation. The vertebral body heights are well maintained. No fracture or pathologic osseous lesion seen. There is calcified capsular hypertrophy seen around the atlantoaxial interval. Soft tissues and spinal canal: The visualized paraspinal soft tissues are unremarkable. No prevertebral soft tissue swelling is seen. The spinal canal is grossly unremarkable, no large epidural collection or significant canal narrowing. Disc levels: Multilevel degenerative changes are seen most notable at C5-C6 and C6-C7 with disc osteophyte complex and uncovertebral osteophytes. Upper chest: The lung apices are clear. Thoracic inlet is within normal limits. Scattered vascular calcifications are noted. Other: None IMPRESSION: 1. No acute intracranial abnormality. 2. Probable 2 cm left CP angle meningioma, unchanged from prior 3.  No acute fracture or malalignment of the  spine. 4. Multilevel degenerative changes with grade 1 anterolisthesis of C4 on C5. Electronically Signed   By: Prudencio Pair M.D.   On: 05/19/2019 00:38   Ct Cervical Spine Wo Contrast  Result Date: 05/19/2019 CLINICAL DATA:  Mechanical fall EXAM: CT HEAD WITHOUT CONTRAST; CT CERVICAL SPINE WITHOUT CONTRAST TECHNIQUE: Contiguous axial images were obtained from the base of the skull through the vertex without intravenous contrast. COMPARISON:  November 09, 2018 FINDINGS: Brain: No evidence of acute territorial infarction, hemorrhage, hydrocephalus,extra-axial collection or mass lesion/mass effect. Again noted is a hyperdense mass seen at the left cerebellar pontine angle measuring up to 2 cm, likely meningioma. There is dilatation the ventricles and sulci consistent with age-related atrophy. Low-attenuation changes in the deep white matter consistent with small vessel ischemia. Vascular: No hyperdense vessel. Calcifications are seen  within the internal carotid artery and vertebral arteries. Skull: The skull is intact. No fracture or focal lesion identified. Sinuses/Orbits: Small amount of bilateral maxillary mucosal thickening seen. The orbits and globes intact. Other: None Cervical spine: Alignment: There is minimal anterolisthesis of C4 on C5 measuring 3 mm. There is slight reversal of the normal cervical lordosis. Skull base and vertebrae: Visualized skull base is intact. No atlanto-occipital dissociation. The vertebral body heights are well maintained. No fracture or pathologic osseous lesion seen. There is calcified capsular hypertrophy seen around the atlantoaxial interval. Soft tissues and spinal canal: The visualized paraspinal soft tissues are unremarkable. No prevertebral soft tissue swelling is seen. The spinal canal is grossly unremarkable, no large epidural collection or significant canal narrowing. Disc levels: Multilevel degenerative changes are seen most notable at C5-C6 and C6-C7 with disc osteophyte  complex and uncovertebral osteophytes. Upper chest: The lung apices are clear. Thoracic inlet is within normal limits. Scattered vascular calcifications are noted. Other: None IMPRESSION: 1. No acute intracranial abnormality. 2. Probable 2 cm left CP angle meningioma, unchanged from prior 3.  No acute fracture or malalignment of the spine. 4. Multilevel degenerative changes with grade 1 anterolisthesis of C4 on C5. Electronically Signed   By: Prudencio Pair M.D.   On: 05/19/2019 00:38   Dg Hip Unilat With Pelvis 2-3 Views Left  Result Date: 05/19/2019 CLINICAL DATA:  Fall left hip pain EXAM: DG HIP (WITH OR WITHOUT PELVIS) 2-3V LEFT COMPARISON:  None. FINDINGS: The patient is status post ORIF of the left fever. No definite periprosthetic fracture or dislocation is seen. There is osteoarthritis at the right hip. There is diffuse osteopenia. Surgical fixation hardware in the lower lumbar spine. IMPRESSION: Status post ORIF of the left proximal femur without definite acute fracture. This is limited due to the diffuse osteopenia. Electronically Signed   By: Prudencio Pair M.D.   On: 05/19/2019 00:57      Procedures   ____________________________________________   INITIAL IMPRESSION / MDM / ASSESSMENT AND PLAN / ED COURSE  As part of my medical decision making, I reviewed the following data within the [place] NUMBER   85 year old female presenting with above-stated history and physical exam following accidental fall with head injury.  CT head performed secondary to head injury in the setting of anticoagulation use.  CT revealed no acute intracranial abnormality.  Left hip x-ray revealed no evidence of fracture.  Patient able to ambulate with walker without difficulty.  Patient also had no lumbar spine pain with palpation his compression fracture was also considered but no pain with palpation.     ____________________________________________  FINAL CLINICAL IMPRESSION(S) / ED  DIAGNOSES  Final diagnoses:  Contusion of left hip, initial encounter     MEDICATIONS GIVEN DURING THIS VISIT:  Medications  lidocaine (LIDODERM) 5 % 1 patch (1 patch Transdermal Patch Applied 05/19/19 0429)  HYDROcodone-acetaminophen (NORCO/VICODIN) 5-325 MG per tablet 1 tablet (1 tablet Oral Given 05/19/19 0506)     ED Discharge Orders         Ordered    lidocaine (LIDODERM) 5 %  Every 12 hours     05/19/19 0548    traMADol (ULTRAM) 50 MG tablet  Every 6 hours PRN     05/19/19 0548          *Please note:  Krista Ortiz was evaluated in Emergency Department on 05/19/2019 for the symptoms described in the history of present illness. She was evaluated in the context of the  global COVID-19 pandemic, which necessitated consideration that the patient might be at risk for infection with the SARS-CoV-2 virus that causes COVID-19. Institutional protocols and algorithms that pertain to the evaluation of patients at risk for COVID-19 are in a state of rapid change based on information released by regulatory bodies including the CDC and federal and state organizations. These policies and algorithms were followed during the patient's care in the ED.  Some ED evaluations and interventions may be delayed as a result of limited staffing during the pandemic.*  Note:  This document was prepared using Dragon voice recognition software and may include unintentional dictation errors.   Gregor Hams, MD 05/19/19 [phone]

## 2019-07-19 IMAGING — US US SOFT TISSUE HEAD/NECK
1 series · 14 of 19 positions shown · non-contrast
Comparison: None available

CLINICAL DATA: Right neck palpable mass for 3 months

EXAM:
ULTRASOUND OF HEAD/NECK SOFT TISSUES
TECHNIQUE: Ultrasound examination of the head and neck soft tissues was
performed in the area of clinical concern.

[Series 1: us soft tissue head/neck · 0.07mm/px · 14 of 19 slices shown]
[im 1/19]
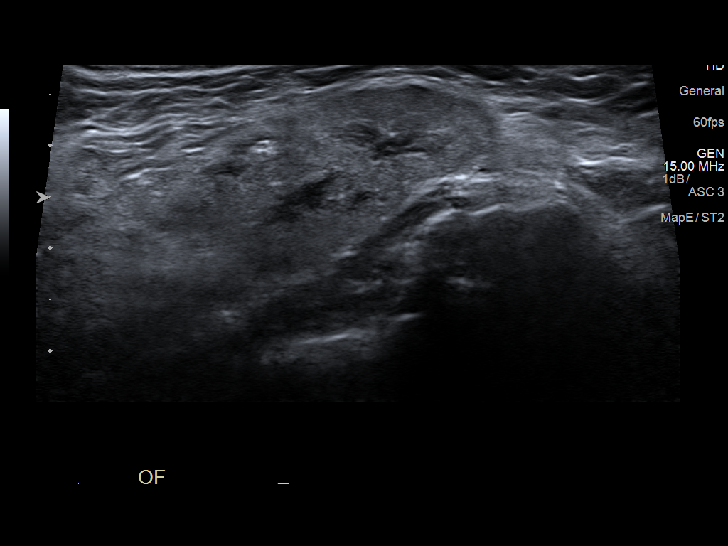
[im 3/19]
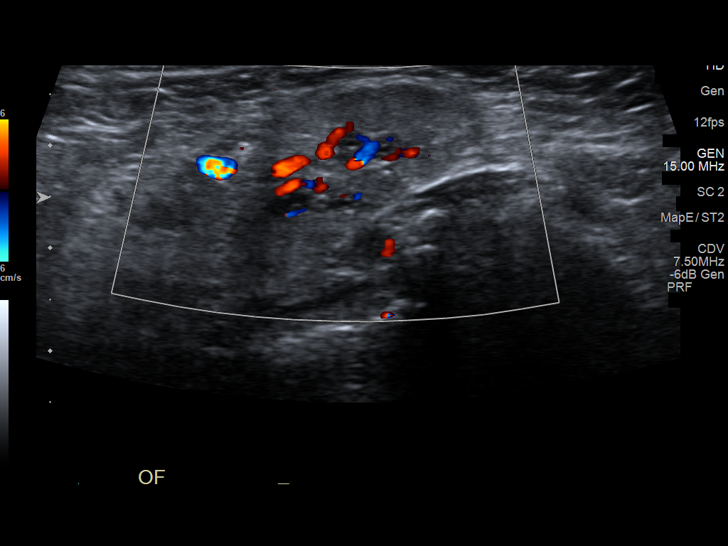
[im 4/19]
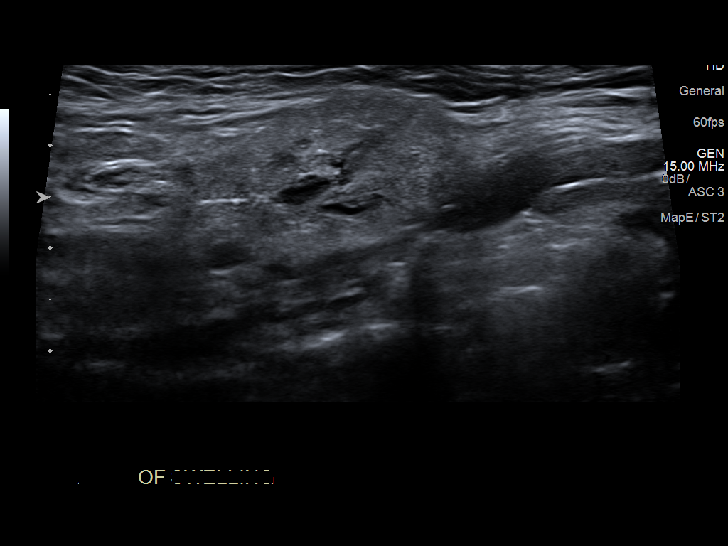
[im 5/19]
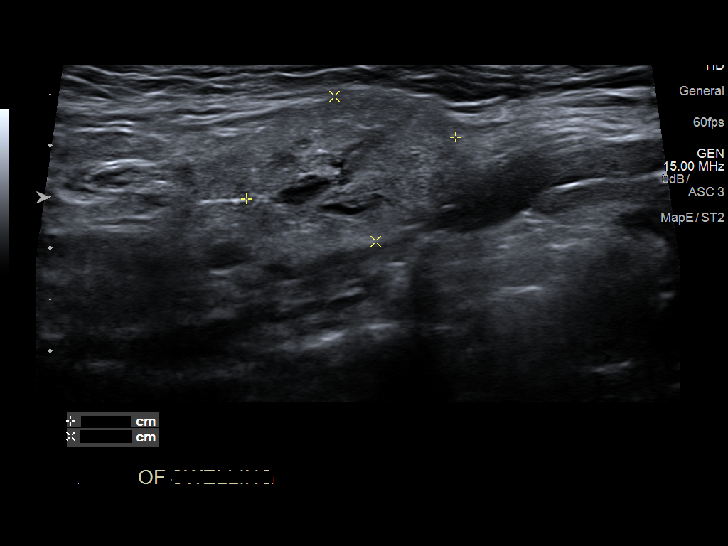
[im 7/19]
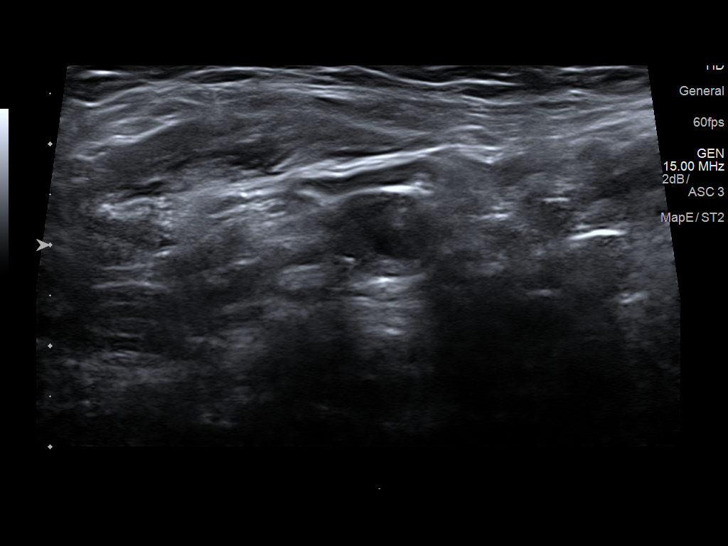
[im 8/19]
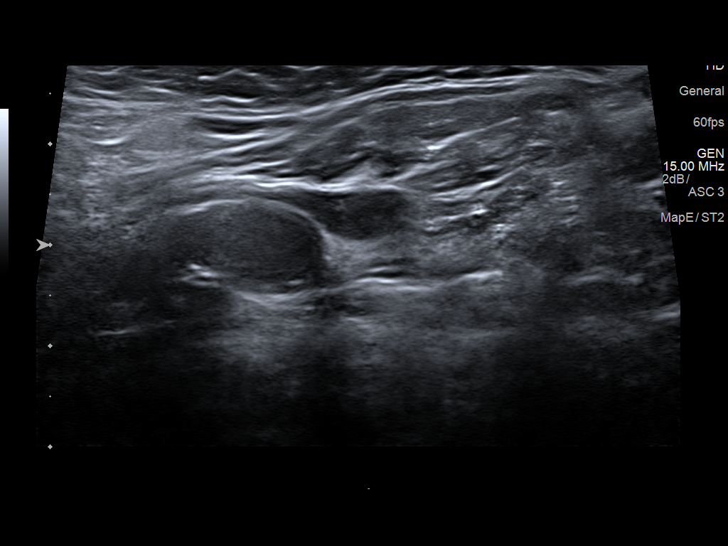
[im 9/19]
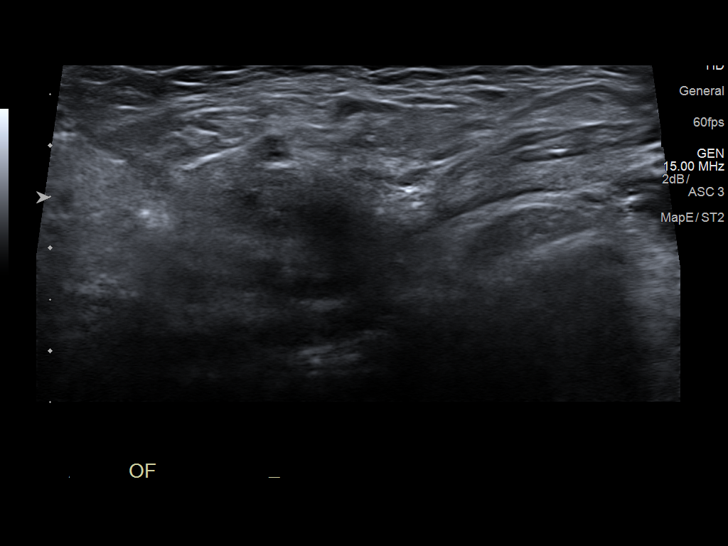
[im 11/19]
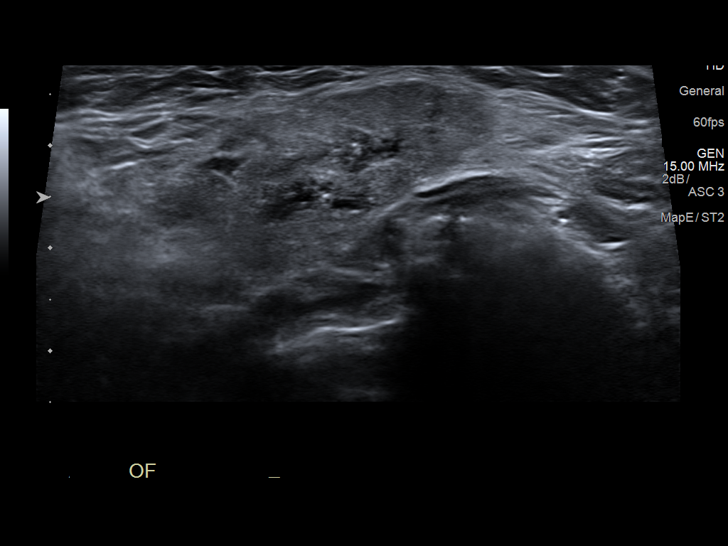
[im 12/19]
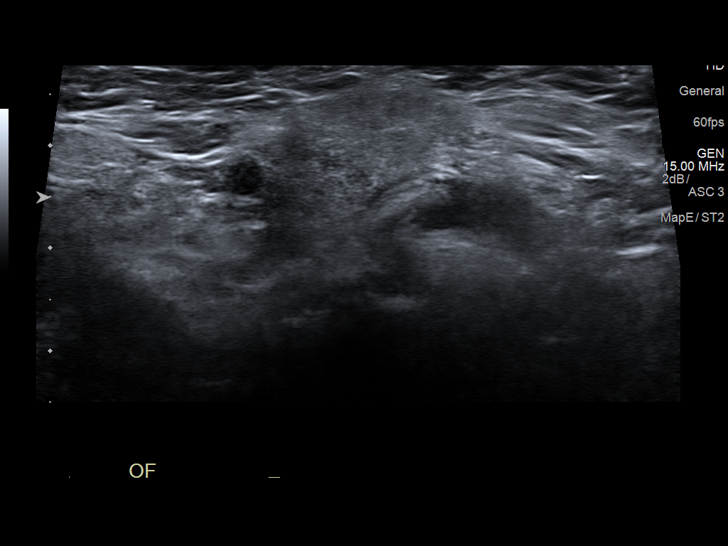
[im 13/19]
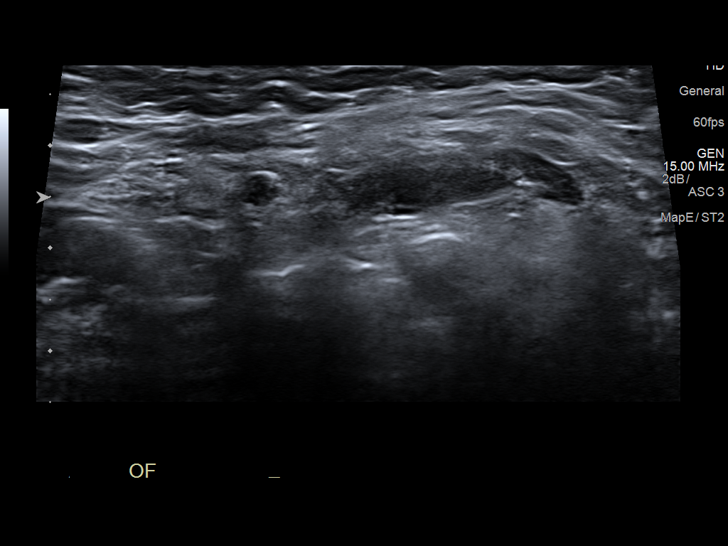
[im 15/19]
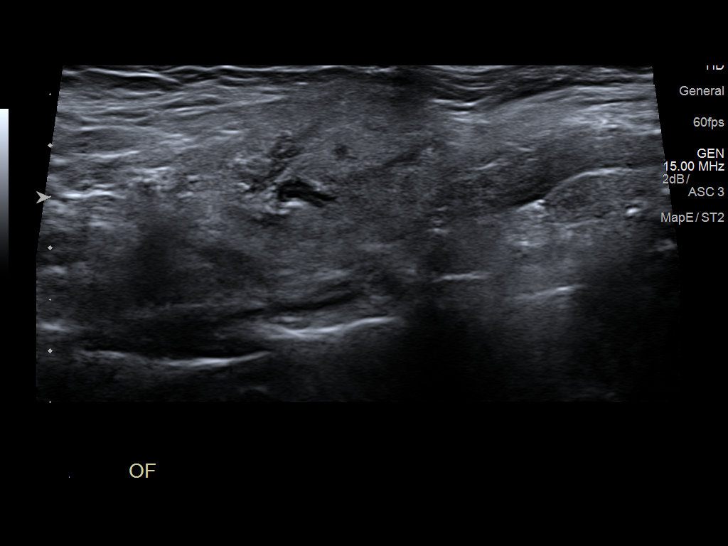
[im 16/19]
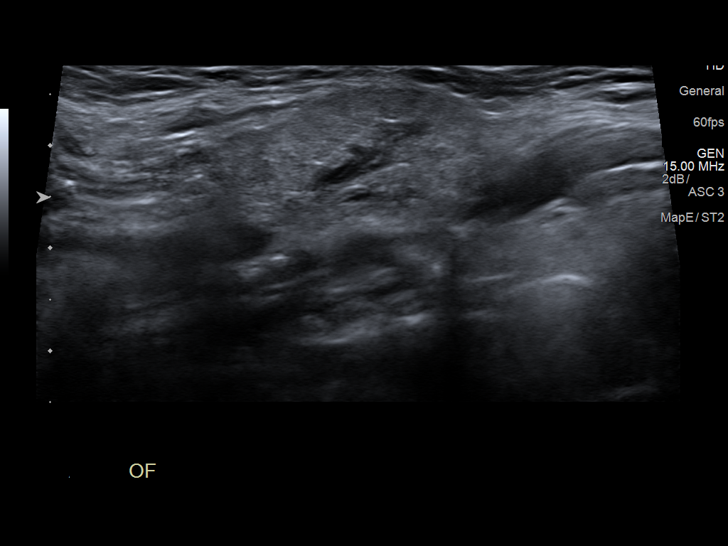
[im 17/19]
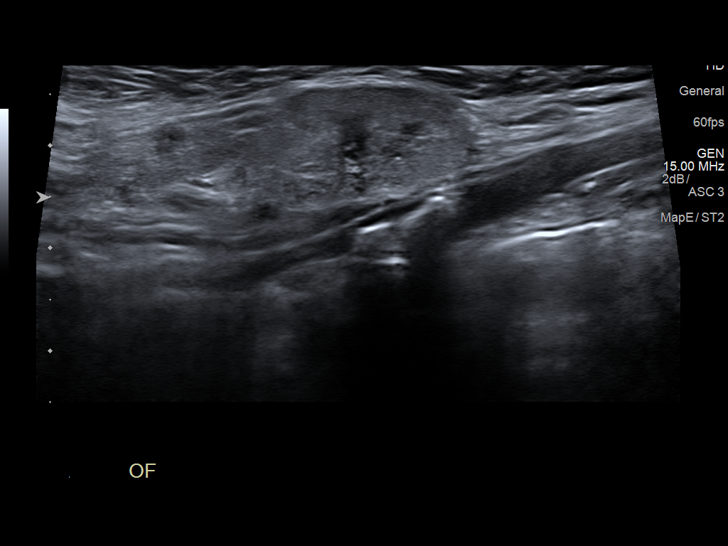
[im 19/19]
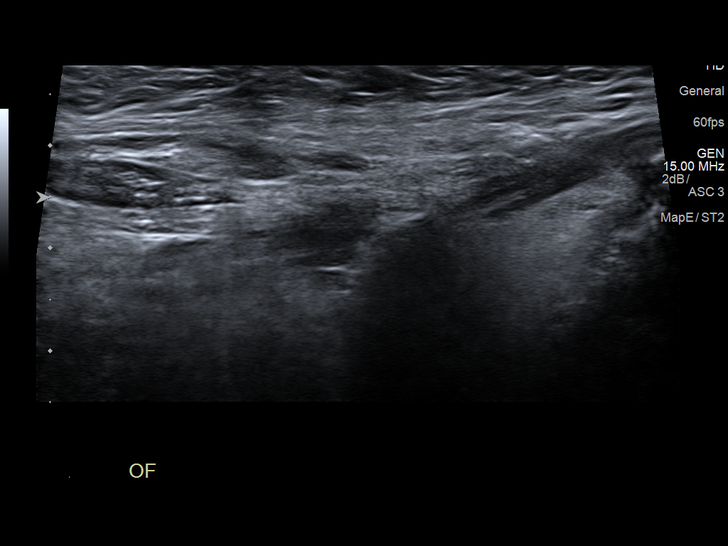

[14 of 19 positions shown; findings below may reference images not displayed]

FINDINGS: Superficial soft tissue ultrasound performed of the right neck
palpable abnormality. This is in the submandibular region. Palpable
abnormality correlates with a superficial echogenic area of soft
tissue measuring 2.9 x 2.1 x 1.5 cm. Normal associated vascularity.
Appearance remains nonspecific but may represent a prominent mildly
enlarged submandibular gland. If there is further concern, consider
neck CT with contrast. No surrounding adenopathy.
IMPRESSION: 2.9 cm right submandibular echogenic soft tissue abnormality by
ultrasound. See above comment and recommendation.

## 2019-09-29 ENCOUNTER — Other Ambulatory Visit: Payer: Self-pay

## 2019-09-29 ENCOUNTER — Inpatient Hospital Stay (HOSPITAL_BASED_OUTPATIENT_CLINIC_OR_DEPARTMENT_OTHER): Payer: Medicare Other | Admitting: Oncology

## 2019-09-29 ENCOUNTER — Inpatient Hospital Stay: Payer: Medicare Other | Attending: Oncology

## 2019-09-29 ENCOUNTER — Encounter: Payer: Self-pay | Admitting: Oncology

## 2019-09-29 VITALS — BP 123/53 | HR 76 | Temp 98.4°F | Resp 18 | Wt 130.5 lb

## 2019-09-29 DIAGNOSIS — F17210 Nicotine dependence, cigarettes, uncomplicated: Secondary | ICD-10-CM | POA: Insufficient documentation

## 2019-09-29 DIAGNOSIS — R5381 Other malaise: Secondary | ICD-10-CM | POA: Insufficient documentation

## 2019-09-29 DIAGNOSIS — I739 Peripheral vascular disease, unspecified: Secondary | ICD-10-CM | POA: Insufficient documentation

## 2019-09-29 DIAGNOSIS — Z79899 Other long term (current) drug therapy: Secondary | ICD-10-CM | POA: Diagnosis not present

## 2019-09-29 DIAGNOSIS — R5383 Other fatigue: Secondary | ICD-10-CM | POA: Diagnosis not present

## 2019-09-29 DIAGNOSIS — Z950 Presence of cardiac pacemaker: Secondary | ICD-10-CM | POA: Insufficient documentation

## 2019-09-29 DIAGNOSIS — D45 Polycythemia vera: Secondary | ICD-10-CM

## 2019-09-29 DIAGNOSIS — Z8673 Personal history of transient ischemic attack (TIA), and cerebral infarction without residual deficits: Secondary | ICD-10-CM | POA: Insufficient documentation

## 2019-09-29 DIAGNOSIS — J449 Chronic obstructive pulmonary disease, unspecified: Secondary | ICD-10-CM | POA: Insufficient documentation

## 2019-09-29 DIAGNOSIS — E785 Hyperlipidemia, unspecified: Secondary | ICD-10-CM | POA: Insufficient documentation

## 2019-09-29 LAB — CBC WITH DIFFERENTIAL/PLATELET
Abs Immature Granulocytes: 0.02 10*3/uL (ref 0.00–0.07)
Basophils Absolute: 0 10*3/uL (ref 0.0–0.1)
Basophils Relative: 1 %
Eosinophils Absolute: 0.1 10*3/uL (ref 0.0–0.5)
Eosinophils Relative: 2 %
HCT: 46.4 % — ABNORMAL HIGH (ref 36.0–46.0)
Hemoglobin: 14.7 g/dL (ref 12.0–15.0)
Immature Granulocytes: 1 %
Lymphocytes Relative: 28 %
Lymphs Abs: 1.1 10*3/uL (ref 0.7–4.0)
MCH: 32.2 pg (ref 26.0–34.0)
MCHC: 31.7 g/dL (ref 30.0–36.0)
MCV: 101.8 fL — ABNORMAL HIGH (ref 80.0–100.0)
Monocytes Absolute: 0.4 10*3/uL (ref 0.1–1.0)
Monocytes Relative: 10 %
Neutro Abs: 2.3 10*3/uL (ref 1.7–7.7)
Neutrophils Relative %: 58 %
Platelets: 323 10*3/uL (ref 150–400)
RBC: 4.56 MIL/uL (ref 3.87–5.11)
RDW: 15.6 % — ABNORMAL HIGH (ref 11.5–15.5)
WBC: 3.9 10*3/uL — ABNORMAL LOW (ref 4.0–10.5)
nRBC: 0 % (ref 0.0–0.2)

## 2019-09-29 LAB — COMPREHENSIVE METABOLIC PANEL
ALT: 12 U/L (ref 0–44)
AST: 16 U/L (ref 15–41)
Albumin: 3.9 g/dL (ref 3.5–5.0)
Alkaline Phosphatase: 92 U/L (ref 38–126)
Anion gap: 10 (ref 5–15)
BUN: 16 mg/dL (ref 8–23)
CO2: 26 mmol/L (ref 22–32)
Calcium: 9.2 mg/dL (ref 8.9–10.3)
Chloride: 100 mmol/L (ref 98–111)
Creatinine, Ser: 1.19 mg/dL — ABNORMAL HIGH (ref 0.44–1.00)
GFR calc Af Amer: 46 mL/min — ABNORMAL LOW (ref >60)
GFR calc non Af Amer: 39 mL/min — ABNORMAL LOW (ref >60)
Glucose, Bld: 105 mg/dL — ABNORMAL HIGH (ref 70–99)
Potassium: 4.2 mmol/L (ref 3.5–5.1)
Sodium: 136 mmol/L (ref 135–145)
Total Bilirubin: 0.7 mg/dL (ref 0.3–1.2)
Total Protein: 6.7 g/dL (ref 6.5–8.1)

## 2019-09-29 NOTE — Progress Notes (Signed)
Pt had ear infection and just got to put her hearing aid back in. She has 1-2 more days of atb and ear gtts but son did not know what it was. Also the pt. Says she is allergic to pcn but not sure what side effect she had because it was long time ago and she is not allergic to pneumonia vaccine per son and pt. And I will remove it.

## 2019-10-02 ENCOUNTER — Ambulatory Visit: Payer: Medicare Other | Admitting: Oncology

## 2019-10-02 ENCOUNTER — Other Ambulatory Visit: Payer: Medicare Other

## 2019-10-02 ENCOUNTER — Encounter: Payer: Self-pay | Admitting: Oncology

## 2019-10-02 NOTE — Progress Notes (Signed)
Hematology/Oncology Consult note [facility]  Telephone:([phone] [phone]  Patient Care Team: Hortencia Pilar, MD as PCP - General (Family Medicine)   Name of the patient: Krista Ortiz  [id number]  1923-11-03   Date of visit: 10/02/19  Diagnosis- Jak 2+ polycythemia vera  Chief complaint/ Reason for visit- routine f/u of polycythemia vera on hydrea  Heme/Onc history: Patient is a 86 year old female with a long-standing history of polycythemia/thrombocytosis.Patient has been on Hydrea 500 mg 3 times a week. She is also required phlebotomy in the past but none recently.JAK2 mutation testing was positive. She has not had a bone marrow biopsy  Interval history-no recent changes as far as her health is concerned.  No history of recent thrombosis  ECOG PS- 2 Pain scale- 0   Review of systems- Review of Systems  Constitutional: Positive for malaise/fatigue. Negative for chills, fever and weight loss.  HENT: Negative for congestion, ear discharge and nosebleeds.   Eyes: Negative for blurred vision.  Respiratory: Negative for cough, hemoptysis, sputum production, shortness of breath and wheezing.   Cardiovascular: Negative for chest pain, palpitations, orthopnea and claudication.  Gastrointestinal: Negative for abdominal pain, blood in stool, constipation, diarrhea, heartburn, melena, nausea and vomiting.  Genitourinary: Negative for dysuria, flank pain, frequency, hematuria and urgency.  Musculoskeletal: Negative for back pain, joint pain and myalgias.  Skin: Negative for rash.  Neurological: Negative for dizziness, tingling, focal weakness, seizures, weakness and headaches.  Endo/Heme/Allergies: Does not bruise/bleed easily.  Psychiatric/Behavioral: Negative for depression and suicidal ideas. The patient does not have insomnia.        Allergies  Allergen Reactions  . Citalopram Other (See Comments)    Reaction:  Fatigue   .  Pneumococcal Vaccine Other (See Comments)    Doesn't remember what occurred.  . Pneumovax [Pneumococcal Polysaccharide Vaccine] Other (See Comments)    Reaction:  Unknown   . Requip [Ropinirole Hcl] Other (See Comments)    Reaction:  Dry mouth   . Penicillins Rash and Other (See Comments)    Has patient had a PCN reaction causing immediate rash, facial/tongue/throat swelling, SOB or lightheadedness with hypotension:  Has patient had a PCN reaction causing severe rash involving mucus membranes or skin necrosis:  Has patient had a PCN reaction that required hospitalization  Has patient had a PCN reaction occurring within the last 10 years:  If all of the above answers are "NO", then may proceed with Cephalosporin use.      Past Medical History:  Diagnosis Date  . Acoustic neuroma [facility])    md notes says history of acoustic neuroma or meningioma followed up by yearly MRI  . Chronic back pain   . COPD (chronic obstructive pulmonary disease) ([place])   . Hyperlipidemia   . Knee joint replacement status, right 11/25/2016  . Polycythemia   . Polycythemia, secondary 04/26/2015  . Port-A-Cath in place   . Presence of permanent cardiac pacemaker   . PVD (peripheral vascular disease) ([place])   . Right wrist fracture 11/25/2016  . Thoracic compression fracture, closed, initial encounter ([place]) 11/25/2016  . TIA (transient ischemic attack)      Past Surgical History:  Procedure Laterality Date  . ABDOMINAL HYSTERECTOMY    . APPENDECTOMY    . BACK SURGERY    . cataracts    . CHOLECYSTECTOMY    . INTRAMEDULLARY (IM) NAIL INTERTROCHANTERIC Left 11/10/2018   Procedure: INTRAMEDULLARY (IM) NAIL INTERTROCHANTRIC;  Surgeon: Corky Mull, MD;  Location: [facility] ORS;  Service: Orthopedics;  Laterality: Left;  . PACEMAKER INSERTION Left 02/18/2015   Procedure: INSERTION PACEMAKER;  Surgeon: Isaias Cowman, MD;  Location: [facility] ORS;  Service: Cardiovascular;  Laterality: Left;  . REPLACEMENT TOTAL KNEE     . TONSILLECTOMY      Social History   Socioeconomic History  . Marital status: Widowed    Spouse name: Not on file  . Number of children: Not on file  . Years of education: Not on file  . Highest education level: Not on file  Occupational History  . Not on file  Tobacco Use  . Smoking status: Current Every Day Smoker    Packs/day: 0.50    Types: Cigarettes  . Smokeless tobacco: Never Used  Substance and Sexual Activity  . Alcohol use: No  . Drug use: No  . Sexual activity: Not Currently  Other Topics Concern  . Not on file  Social History Narrative  . Not on file   Social Determinants of Health   Financial Resource Strain:   . Difficulty of Paying Living Expenses: Not on file  Food Insecurity:   . Worried About Charity fundraiser in the Last Year: Not on file  . Ran Out of Food in the Last Year: Not on file  Transportation Needs:   . Lack of Transportation (Medical): Not on file  . Lack of Transportation (Non-Medical): Not on file  Physical Activity:   . Days of Exercise per Week: Not on file  . Minutes of Exercise per Session: Not on file  Stress:   . Feeling of Stress : Not on file  Social Connections:   . Frequency of Communication with Friends and Family: Not on file  . Frequency of Social Gatherings with Friends and Family: Not on file  . Attends Religious Services: Not on file  . Active Member of Clubs or Organizations: Not on file  . Attends Archivist Meetings: Not on file  . Marital Status: Not on file  Intimate Partner Violence:   . Fear of Current or Ex-Partner: Not on file  . Emotionally Abused: Not on file  . Physically Abused: Not on file  . Sexually Abused: Not on file    Family History  Problem Relation Age of Onset  . Other Mother        Old age  . Other Father        Old age  . CVA Sister   . Heart attack Brother      Current Outpatient Medications:  .  acetaminophen (TYLENOL) 325 MG tablet, Take 650 mg by mouth every  4 (four) hours as needed for mild pain or moderate pain. , Disp: , Rfl:  .  albuterol (PROVENTIL) (2.5 MG/3ML) 0.083% nebulizer solution, Inhale 3 mLs into the lungs every 6 (six) hours as needed for wheezing or shortness of breath. , Disp: , Rfl: 0 .  atorvastatin (LIPITOR) 10 MG tablet, Take 10 mg by mouth daily., Disp: , Rfl:  .  clopidogrel (PLAVIX) 75 MG tablet, Take 75 mg by mouth daily., Disp: , Rfl:  .  furosemide (LASIX) 20 MG tablet, Take 20 mg by mouth 2 (two) times daily. , Disp: , Rfl:  .  hydroxyurea (HYDREA) 500 MG capsule, Take 500 mg by mouth every Monday, Wednesday, and Friday. , Disp: , Rfl:  .  potassium chloride (K-DUR) 10 MEQ tablet, Take 10 mEq by mouth daily., Disp: , Rfl:  No current facility-administered medications for this visit.  Facility-Administered Medications  Ordered in Other Visits:  .  sodium chloride flush (NS) 0.9 % injection 10 mL, 10 mL, Intravenous, PRN, Herring, Orville Govern, NP, 10 mL at 10/03/15 2831  Physical exam:  Vitals:   09/29/19 1116  BP: (!) 123/53  Pulse: 76  Resp: 18  Temp: 98.4 F (36.9 C)  TempSrc: Tympanic  Weight: 130 lb 8 oz (59.2 kg)   Physical Exam Constitutional:      Comments: Frail elderly woman sitting in a wheelchair.  Appears in no acute distress  HENT:     Head: Normocephalic and atraumatic.  Eyes:     Pupils: Pupils are equal, round, and reactive to light.  Cardiovascular:     Rate and Rhythm: Normal rate and regular rhythm.     Heart sounds: Normal heart sounds.  Pulmonary:     Effort: Pulmonary effort is normal.     Breath sounds: Normal breath sounds.  Abdominal:     General: Bowel sounds are normal.     Palpations: Abdomen is soft.  Musculoskeletal:     Cervical back: Normal range of motion.  Skin:    General: Skin is warm and dry.  Neurological:     Mental Status: She is alert and oriented to person, place, and time.      CMP Latest Ref Rng & Units 09/29/2019  Glucose 70 - 99 mg/dL 105(H)  BUN 8 - 23  mg/dL 16  Creatinine 0.44 - 1.00 mg/dL 1.19(H)  Sodium 135 - 145 mmol/L 136  Potassium 3.5 - 5.1 mmol/L 4.2  Chloride 98 - 111 mmol/L 100  CO2 22 - 32 mmol/L 26  Calcium 8.9 - 10.3 mg/dL 9.2  Total Protein 6.5 - 8.1 g/dL 6.7  Total Bilirubin 0.3 - 1.2 mg/dL 0.7  Alkaline Phos 38 - 126 U/L 92  AST 15 - 41 U/L 16  ALT 0 - 44 U/L 12   CBC Latest Ref Rng & Units 09/29/2019  WBC 4.0 - 10.5 K/uL 3.9(L)  Hemoglobin 12.0 - 15.0 g/dL 14.7  Hematocrit 36.0 - 46.0 % 46.4(H)  Platelets 150 - 400 K/uL 323      Assessment and plan- Patient is a 86 y.o. female with JAK2 positive polycythemia vera currently on Hydrea Here for routine follow-up  Patient's hematocrit has been typically less than 45.  Her value from today is 46.4 which is a little higher than her target level of less than 45.  However given her age I will hold off on phlebotomy at this time.  She will continueHydrea at present dose of 500 mg Monday Wednesday and Friday.  I will repeat her CBC with differential in 3 months in 6 months and see her back in 6 months.  If her hematocrit remains high at 3 months I will adjust her dose of Hydrea further   Visit Diagnosis 1. High risk medication use   2. Polycythemia vera ([place])      Dr. Randa Evens, MD, MPH [facility] [phone] 10/02/2019 12:43 PM

## 2019-12-29 ENCOUNTER — Other Ambulatory Visit: Payer: Self-pay

## 2019-12-29 ENCOUNTER — Inpatient Hospital Stay: Payer: Medicare Other | Attending: Oncology

## 2019-12-29 DIAGNOSIS — D45 Polycythemia vera: Secondary | ICD-10-CM | POA: Insufficient documentation

## 2019-12-29 LAB — CBC WITH DIFFERENTIAL/PLATELET
Abs Immature Granulocytes: 0.02 10*3/uL (ref 0.00–0.07)
Basophils Absolute: 0 10*3/uL (ref 0.0–0.1)
Basophils Relative: 1 %
Eosinophils Absolute: 0 10*3/uL (ref 0.0–0.5)
Eosinophils Relative: 1 %
HCT: 38.3 % (ref 36.0–46.0)
Hemoglobin: 12.7 g/dL (ref 12.0–15.0)
Immature Granulocytes: 1 %
Lymphocytes Relative: 29 %
Lymphs Abs: 0.9 10*3/uL (ref 0.7–4.0)
MCH: 36.1 pg — ABNORMAL HIGH (ref 26.0–34.0)
MCHC: 33.2 g/dL (ref 30.0–36.0)
MCV: 108.8 fL — ABNORMAL HIGH (ref 80.0–100.0)
Monocytes Absolute: 0.3 10*3/uL (ref 0.1–1.0)
Monocytes Relative: 10 %
Neutro Abs: 1.9 10*3/uL (ref 1.7–7.7)
Neutrophils Relative %: 58 %
Platelets: 356 10*3/uL (ref 150–400)
RBC: 3.52 MIL/uL — ABNORMAL LOW (ref 3.87–5.11)
RDW: 14.2 % (ref 11.5–15.5)
WBC: 3.3 10*3/uL — ABNORMAL LOW (ref 4.0–10.5)
nRBC: 0 % (ref 0.0–0.2)

## 2019-12-29 LAB — COMPREHENSIVE METABOLIC PANEL
ALT: 16 U/L (ref 0–44)
AST: 17 U/L (ref 15–41)
Albumin: 3.6 g/dL (ref 3.5–5.0)
Alkaline Phosphatase: 89 U/L (ref 38–126)
Anion gap: 9 (ref 5–15)
BUN: 15 mg/dL (ref 8–23)
CO2: 28 mmol/L (ref 22–32)
Calcium: 8.8 mg/dL — ABNORMAL LOW (ref 8.9–10.3)
Chloride: 100 mmol/L (ref 98–111)
Creatinine, Ser: 1.12 mg/dL — ABNORMAL HIGH (ref 0.44–1.00)
GFR calc Af Amer: 49 mL/min — ABNORMAL LOW (ref >60)
GFR calc non Af Amer: 42 mL/min — ABNORMAL LOW (ref >60)
Glucose, Bld: 103 mg/dL — ABNORMAL HIGH (ref 70–99)
Potassium: 4.6 mmol/L (ref 3.5–5.1)
Sodium: 137 mmol/L (ref 135–145)
Total Bilirubin: 0.5 mg/dL (ref 0.3–1.2)
Total Protein: 6.5 g/dL (ref 6.5–8.1)

## 2020-01-14 ENCOUNTER — Emergency Department: Payer: Medicare Other

## 2020-01-14 ENCOUNTER — Inpatient Hospital Stay
Admission: EM | Admit: 2020-01-14 | Discharge: 2020-01-17 | DRG: 071 | Disposition: A | Discharge status: Home Health | Payer: Medicare Other | Attending: Internal Medicine | Admitting: Internal Medicine

## 2020-01-14 ENCOUNTER — Other Ambulatory Visit: Payer: Self-pay

## 2020-01-14 DIAGNOSIS — I495 Sick sinus syndrome: Secondary | ICD-10-CM | POA: Diagnosis present

## 2020-01-14 DIAGNOSIS — N179 Acute kidney failure, unspecified: Secondary | ICD-10-CM | POA: Diagnosis present

## 2020-01-14 DIAGNOSIS — I739 Peripheral vascular disease, unspecified: Secondary | ICD-10-CM | POA: Diagnosis not present

## 2020-01-14 DIAGNOSIS — G9340 Encephalopathy, unspecified: Secondary | ICD-10-CM | POA: Diagnosis present

## 2020-01-14 DIAGNOSIS — Z96651 Presence of right artificial knee joint: Secondary | ICD-10-CM | POA: Diagnosis present

## 2020-01-14 DIAGNOSIS — R05 Cough: Secondary | ICD-10-CM | POA: Diagnosis present

## 2020-01-14 DIAGNOSIS — Z20822 Contact with and (suspected) exposure to covid-19: Secondary | ICD-10-CM | POA: Diagnosis present

## 2020-01-14 DIAGNOSIS — Z79899 Other long term (current) drug therapy: Secondary | ICD-10-CM

## 2020-01-14 DIAGNOSIS — G8929 Other chronic pain: Secondary | ICD-10-CM | POA: Diagnosis present

## 2020-01-14 DIAGNOSIS — I480 Paroxysmal atrial fibrillation: Secondary | ICD-10-CM | POA: Diagnosis present

## 2020-01-14 DIAGNOSIS — H9190 Unspecified hearing loss, unspecified ear: Secondary | ICD-10-CM | POA: Diagnosis present

## 2020-01-14 DIAGNOSIS — R54 Age-related physical debility: Secondary | ICD-10-CM | POA: Diagnosis present

## 2020-01-14 DIAGNOSIS — Z823 Family history of stroke: Secondary | ICD-10-CM

## 2020-01-14 DIAGNOSIS — M549 Dorsalgia, unspecified: Secondary | ICD-10-CM | POA: Diagnosis present

## 2020-01-14 DIAGNOSIS — R059 Cough, unspecified: Secondary | ICD-10-CM | POA: Diagnosis present

## 2020-01-14 DIAGNOSIS — Z8673 Personal history of transient ischemic attack (TIA), and cerebral infarction without residual deficits: Secondary | ICD-10-CM

## 2020-01-14 DIAGNOSIS — D333 Benign neoplasm of cranial nerves: Secondary | ICD-10-CM | POA: Diagnosis present

## 2020-01-14 DIAGNOSIS — Z887 Allergy status to serum and vaccine status: Secondary | ICD-10-CM

## 2020-01-14 DIAGNOSIS — R569 Unspecified convulsions: Secondary | ICD-10-CM

## 2020-01-14 DIAGNOSIS — R4182 Altered mental status, unspecified: Secondary | ICD-10-CM | POA: Diagnosis not present

## 2020-01-14 DIAGNOSIS — G9341 Metabolic encephalopathy: Secondary | ICD-10-CM | POA: Diagnosis not present

## 2020-01-14 DIAGNOSIS — J449 Chronic obstructive pulmonary disease, unspecified: Secondary | ICD-10-CM | POA: Diagnosis present

## 2020-01-14 DIAGNOSIS — Z7902 Long term (current) use of antithrombotics/antiplatelets: Secondary | ICD-10-CM

## 2020-01-14 DIAGNOSIS — D751 Secondary polycythemia: Secondary | ICD-10-CM | POA: Diagnosis present

## 2020-01-14 DIAGNOSIS — E785 Hyperlipidemia, unspecified: Secondary | ICD-10-CM | POA: Diagnosis present

## 2020-01-14 DIAGNOSIS — F17210 Nicotine dependence, cigarettes, uncomplicated: Secondary | ICD-10-CM | POA: Diagnosis present

## 2020-01-14 DIAGNOSIS — R443 Hallucinations, unspecified: Secondary | ICD-10-CM | POA: Diagnosis not present

## 2020-01-14 DIAGNOSIS — Z880 Allergy status to penicillin: Secondary | ICD-10-CM

## 2020-01-14 DIAGNOSIS — N1831 Chronic kidney disease, stage 3a: Secondary | ICD-10-CM | POA: Diagnosis present

## 2020-01-14 DIAGNOSIS — Z950 Presence of cardiac pacemaker: Secondary | ICD-10-CM

## 2020-01-14 DIAGNOSIS — R197 Diarrhea, unspecified: Secondary | ICD-10-CM | POA: Diagnosis present

## 2020-01-14 DIAGNOSIS — D45 Polycythemia vera: Secondary | ICD-10-CM | POA: Diagnosis present

## 2020-01-14 DIAGNOSIS — Z888 Allergy status to other drugs, medicaments and biological substances status: Secondary | ICD-10-CM

## 2020-01-14 LAB — TROPONIN I (HIGH SENSITIVITY)
Troponin I (High Sensitivity): 34 ng/L — ABNORMAL HIGH (ref <18)
Troponin I (High Sensitivity): 35 ng/L — ABNORMAL HIGH (ref <18)

## 2020-01-14 LAB — URINALYSIS, COMPLETE (UACMP) WITH MICROSCOPIC
Bilirubin Urine: NEGATIVE
Glucose, UA: NEGATIVE mg/dL
Hgb urine dipstick: NEGATIVE
Ketones, ur: 5 mg/dL — AB
Leukocytes,Ua: NEGATIVE
Nitrite: NEGATIVE
Protein, ur: 30 mg/dL — AB
Specific Gravity, Urine: 1.013 (ref 1.005–1.030)
pH: 7 (ref 5.0–8.0)

## 2020-01-14 LAB — CBC
HCT: 39.2 % (ref 36.0–46.0)
Hemoglobin: 12.8 g/dL (ref 12.0–15.0)
MCH: 36.5 pg — ABNORMAL HIGH (ref 26.0–34.0)
MCHC: 32.7 g/dL (ref 30.0–36.0)
MCV: 111.7 fL — ABNORMAL HIGH (ref 80.0–100.0)
Platelets: 365 10*3/uL (ref 150–400)
RBC: 3.51 MIL/uL — ABNORMAL LOW (ref 3.87–5.11)
RDW: 14.2 % (ref 11.5–15.5)
WBC: 3.1 10*3/uL — ABNORMAL LOW (ref 4.0–10.5)
nRBC: 0 % (ref 0.0–0.2)

## 2020-01-14 LAB — COMPREHENSIVE METABOLIC PANEL
ALT: 10 U/L (ref 0–44)
AST: 23 U/L (ref 15–41)
Albumin: 3.5 g/dL (ref 3.5–5.0)
Alkaline Phosphatase: 75 U/L (ref 38–126)
Anion gap: 12 (ref 5–15)
BUN: 13 mg/dL (ref 8–23)
CO2: 23 mmol/L (ref 22–32)
Calcium: 8.8 mg/dL — ABNORMAL LOW (ref 8.9–10.3)
Chloride: 102 mmol/L (ref 98–111)
Creatinine, Ser: 1.33 mg/dL — ABNORMAL HIGH (ref 0.44–1.00)
GFR calc Af Amer: 40 mL/min — ABNORMAL LOW (ref >60)
GFR calc non Af Amer: 34 mL/min — ABNORMAL LOW (ref >60)
Glucose, Bld: 142 mg/dL — ABNORMAL HIGH (ref 70–99)
Potassium: 4.5 mmol/L (ref 3.5–5.1)
Sodium: 137 mmol/L (ref 135–145)
Total Bilirubin: 1 mg/dL (ref 0.3–1.2)
Total Protein: 6.3 g/dL — ABNORMAL LOW (ref 6.5–8.1)

## 2020-01-14 LAB — RESPIRATORY PANEL BY RT PCR (FLU A&B, COVID)
Influenza A by PCR: NEGATIVE
Influenza B by PCR: NEGATIVE
SARS Coronavirus 2 by RT PCR: NEGATIVE

## 2020-01-14 LAB — HEMOGLOBIN A1C
Hgb A1c MFr Bld: 5.2 % (ref 4.8–5.6)
Mean Plasma Glucose: 102.54 mg/dL

## 2020-01-14 LAB — PHOSPHORUS: Phosphorus: 2.5 mg/dL (ref 2.5–4.6)

## 2020-01-14 LAB — TSH: TSH: 0.853 u[IU]/mL (ref 0.350–4.500)

## 2020-01-14 LAB — MAGNESIUM: Magnesium: 1.7 mg/dL (ref 1.7–2.4)

## 2020-01-14 MED ORDER — HYDROXYUREA 500 MG PO CAPS
500.0000 mg | ORAL_CAPSULE | ORAL | Status: DC
  Administered 2020-01-15 – 2020-01-17 (×2): 500 mg via ORAL
  Filled 2020-01-14 (×2): qty 1

## 2020-01-14 MED ORDER — ENOXAPARIN SODIUM 40 MG/0.4ML ~~LOC~~ SOLN
40.0000 mg | SUBCUTANEOUS | Status: DC
Start: 2020-01-14 — End: 2020-01-14

## 2020-01-14 MED ORDER — ENOXAPARIN SODIUM 40 MG/0.4ML ~~LOC~~ SOLN
30.0000 mg | SUBCUTANEOUS | Status: DC
  Administered 2020-01-15 (×2): 30 mg via SUBCUTANEOUS
  Filled 2020-01-14 (×2): qty 0.4

## 2020-01-14 MED ORDER — ACETAMINOPHEN 650 MG RE SUPP: 650.0000 mg | Freq: Four times a day (QID) | RECTAL | Status: DC | PRN

## 2020-01-14 MED ORDER — POLYETHYLENE GLYCOL 3350 17 G PO PACK: 17.0000 g | PACK | Freq: Every day | ORAL | Status: DC | PRN

## 2020-01-14 MED ORDER — LACTATED RINGERS IV SOLN: INTRAVENOUS | Status: DC

## 2020-01-14 MED ORDER — IPRATROPIUM-ALBUTEROL 0.5-2.5 (3) MG/3ML IN SOLN: 3.0000 mL | Freq: Four times a day (QID) | RESPIRATORY_TRACT | Status: DC | PRN

## 2020-01-14 MED ORDER — ACETAMINOPHEN 325 MG PO TABS
650.0000 mg | ORAL_TABLET | Freq: Four times a day (QID) | ORAL | Status: DC | PRN
  Administered 2020-01-15: 650 mg via ORAL
  Filled 2020-01-14: qty 2

## 2020-01-14 MED ORDER — CLOPIDOGREL BISULFATE 75 MG PO TABS
75.0000 mg | ORAL_TABLET | Freq: Every day | ORAL | Status: DC
  Administered 2020-01-15 – 2020-01-17 (×3): 75 mg via ORAL
  Filled 2020-01-14 (×3): qty 1

## 2020-01-14 MED ORDER — ATORVASTATIN CALCIUM 20 MG PO TABS
10.0000 mg | ORAL_TABLET | Freq: Every day | ORAL | Status: DC
  Administered 2020-01-15 – 2020-01-17 (×3): 10 mg via ORAL
  Filled 2020-01-14 (×4): qty 1

## 2020-01-14 MED ORDER — ONDANSETRON HCL 4 MG PO TABS: 4.0000 mg | ORAL_TABLET | Freq: Four times a day (QID) | ORAL | Status: DC | PRN

## 2020-01-14 MED ORDER — ONDANSETRON HCL 4 MG/2ML IJ SOLN: 4.0000 mg | Freq: Four times a day (QID) | INTRAMUSCULAR | Status: DC | PRN

## 2020-01-14 MED ORDER — SODIUM CHLORIDE 0.9% FLUSH
3.0000 mL | Freq: Two times a day (BID) | INTRAVENOUS | Status: DC
  Administered 2020-01-16 – 2020-01-17 (×3): 3 mL via INTRAVENOUS

## 2020-01-14 MED ORDER — ONDANSETRON HCL 4 MG/2ML IJ SOLN
4.0000 mg | Freq: Once | INTRAMUSCULAR | Status: AC
  Administered 2020-01-14: 14:00:00 4 mg via INTRAVENOUS
  Filled 2020-01-14: qty 2

## 2020-01-14 NOTE — ED Provider Notes (Signed)
[facility] Emergency Department Provider Note ____________________________________________   First MD Initiated Contact with Patient 01/14/20 1321     (approximate)  I have reviewed the triage vital signs and the nursing notes.   HISTORY  Chief Complaint Altered Mental Status  HPI Krista Ortiz is a 86 y.o. female who presents to the emergency department for treatment and evaluation of altered mental status.  Son states that he last spoke with her day before yesterday and she seemed to be normal.  She lives alone and is typically able to take care of her ADLs without assistance.  She does not have home health or a sitter.  Son states that today he went over to see her and she seemed to be quiet and not answering questions as usual.  After being there for some time he noticed that her eyes rolled back into her head and she became very stiff.  He denies her having a history of seizures.  To his knowledge none of her medications have changed recently and she has taken them as prescribed.  She remains alert but confused with garbled speech.         Past Medical History:  Diagnosis Date  . Acoustic neuroma [facility])    md notes says history of acoustic neuroma or meningioma followed up by yearly MRI  . Chronic back pain   . COPD (chronic obstructive pulmonary disease) ([place])   . Hyperlipidemia   . Knee joint replacement status, right 11/25/2016  . Polycythemia   . Polycythemia, secondary 04/26/2015  . Port-A-Cath in place   . Presence of permanent cardiac pacemaker   . PVD (peripheral vascular disease) ([place])   . Right wrist fracture 11/25/2016  . Thoracic compression fracture, closed, initial encounter ([place]) 11/25/2016  . TIA (transient ischemic attack)     Patient Active Problem List   Diagnosis Date Noted  . Altered mental status, unspecified 01/14/2020  . Hip fracture ([place]) 11/09/2018  . COPD (chronic obstructive pulmonary disease) ([place]) 02/16/2017   . COPD exacerbation ([place]) 02/15/2017  . Polycythemia 04/02/2016  . Back ache 10/03/2015  . Chronic obstructive pulmonary disease ([place]) 10/03/2015  . Dizziness 10/03/2015  . Decrease in the ability to hear 10/03/2015  . Neoplasm of meninges 10/03/2015  . Combined fat and carbohydrate induced hyperlipemia 10/03/2015  . Neuroma 10/03/2015  . Impaired vision 10/03/2015  . Presence of other vascular implants and grafts 10/03/2015  . Current smoker 10/03/2015  . Temporary cerebral vascular dysfunction 10/03/2015  . Abnormal CBC 06/12/2015  . Chronic LBP 06/12/2015  . Polycythemia vera ([place]) 04/26/2015  . Hypersensitivity to pneumococcal vaccine 02/21/2015  . Carotid artery narrowing 02/19/2015  . Sick sinus syndrome ([place]) 02/16/2015  . Syncope and collapse 02/15/2015  . Edema leg 10/25/2014  . TI (tricuspid incompetence) 10/25/2014  . Breathlessness on exertion 10/25/2014  . Chest pain at rest 10/03/2014  . Dyspnea, paroxysmal nocturnal 10/03/2014  . Amnesia 04/02/2014  . Abnormal weight loss 04/02/2014  . Osteopenia 03/15/2014  . Chronic pain 07/06/2013  . Peripheral vascular disease ([place]) 06/27/2013  . Ejection murmur 12/09/2012    Past Surgical History:  Procedure Laterality Date  . ABDOMINAL HYSTERECTOMY    . APPENDECTOMY    . BACK SURGERY    . cataracts    . CHOLECYSTECTOMY    . INTRAMEDULLARY (IM) NAIL INTERTROCHANTERIC Left 11/10/2018   Procedure: INTRAMEDULLARY (IM) NAIL INTERTROCHANTRIC;  Surgeon: Corky Mull, MD;  Location: [facility] ORS;  Service: Orthopedics;  Laterality: Left;  .  PACEMAKER INSERTION Left 02/18/2015   Procedure: INSERTION PACEMAKER;  Surgeon: Isaias Cowman, MD;  Location: [facility] ORS;  Service: Cardiovascular;  Laterality: Left;  . REPLACEMENT TOTAL KNEE    . TONSILLECTOMY      Prior to Admission medications   Medication Sig Start Date End Date Taking? Authorizing Provider  acetaminophen (TYLENOL) 325 MG tablet Take 650 mg by mouth every 4  (four) hours as needed for mild pain or moderate pain.    Yes [provider]  atorvastatin (LIPITOR) 10 MG tablet Take 10 mg by mouth daily.   Yes [provider]  ciprofloxacin (CIPRO) 500 MG tablet Take 500 mg by mouth 2 (two) times daily. 01/01/20 01/15/20 Yes [provider]  clopidogrel (PLAVIX) 75 MG tablet Take 75 mg by mouth daily.   Yes [provider]  furosemide (LASIX) 20 MG tablet Take 20 mg by mouth 2 (two) times daily.    Yes [provider]  hydroxyurea (HYDREA) 500 MG capsule Take 500 mg by mouth every Monday, Wednesday, and Friday.    Yes [provider]  ipratropium-albuterol (DUONEB) 0.5-2.5 (3) MG/3ML SOLN Take 3 mLs by nebulization every 6 (six) hours as needed (shortness of breath or wheezing).   Yes [provider]  potassium chloride (K-DUR) 10 MEQ tablet Take 10 mEq by mouth 2 (two) times daily.    Yes [provider]    Allergies Citalopram, Pneumococcal vaccine, Pneumovax [pneumococcal polysaccharide vaccine], Requip [ropinirole hcl], and Penicillins  Family History  Problem Relation Age of Onset  . Other Mother        Old age  . Other Father        Old age  . CVA Sister   . Heart attack Brother     Social History Social History   Tobacco Use  . Smoking status: Current Every Day Smoker    Packs/day: 0.50    Types: Cigarettes  . Smokeless tobacco: Never Used  Substance Use Topics  . Alcohol use: No  . Drug use: No    Review of Systems  Level 5 caveat: Altered mental status ____________________________________________   PHYSICAL EXAM:  VITAL SIGNS: ED Triage Vitals [01/14/20 1243]  Enc Vitals Group     BP (!) 138/55     Pulse Rate 83     Resp 20     Temp 97.8 F (36.6 C)     Temp Source Axillary     SpO2 98 %     Weight 126 lb 1.7 oz (57.2 kg)     Height 5\' 6"  (1.676 m)     Head Circumference      Peak Flow      Pain Score      Pain Loc      Pain Edu?       Excl. in [place]?     Constitutional: Alert. No acute distress. Eyes: Conjunctivae are normal. Head: Atraumatic. Nose: No congestion/rhinnorhea. Mouth/Throat: Mucous membranes are moist.  Hematological/Lymphatic/Immunilogical: No cervical lymphadenopathy. Cardiovascular: Normal rate, regular rhythm. Grossly normal heart sounds.  Good peripheral circulation. Respiratory: Normal respiratory effort.  No retractions. Lungs CTAB. Gastrointestinal: Soft and nontender. No distention. No abdominal bruits. No CVA tenderness. Genitourinary:  Musculoskeletal: No lower extremity tenderness nor edema.  No joint effusions. Neurologic: Does not follow commands  skin:  Skin is warm, dry and intact. No rash noted. Psychiatric: Mood and affect are normal. Speech and behavior are normal.  ____________________________________________   LABS (all labs ordered are listed, but  only abnormal results are displayed)  Labs Reviewed  COMPREHENSIVE METABOLIC PANEL - Abnormal; Notable for the following components:      Result Value   Glucose, Bld 142 (*)    Creatinine, Ser 1.33 (*)    Calcium 8.8 (*)    Total Protein 6.3 (*)    GFR calc non Af Amer 34 (*)    GFR calc Af Amer 40 (*)    All other components within normal limits  CBC - Abnormal; Notable for the following components:   WBC 3.1 (*)    RBC 3.51 (*)    MCV 111.7 (*)    MCH 36.5 (*)    All other components within normal limits  URINALYSIS, COMPLETE (UACMP) WITH MICROSCOPIC - Abnormal; Notable for the following components:   Color, Urine YELLOW (*)    APPearance CLOUDY (*)    Ketones, ur 5 (*)    Protein, ur 30 (*)    Bacteria, UA FEW (*)    All other components within normal limits  TROPONIN I (HIGH SENSITIVITY) - Abnormal; Notable for the following components:   Troponin I (High Sensitivity) 34 (*)    All other components within normal limits  TROPONIN I (HIGH SENSITIVITY) - Abnormal; Notable for the following components:   Troponin I (High  Sensitivity) 35 (*)    All other components within normal limits  RESPIRATORY PANEL BY RT PCR (FLU A&B, COVID)  TSH  MAGNESIUM  PHOSPHORUS  HEMOGLOBIN [id number]  BASIC METABOLIC PANEL  CBG MONITORING, ED   ____________________________________________  EKG  ED ECG REPORT I, Mariem Skolnick, FNP-BC personally viewed and interpreted this ECG.   Date: 01/14/2020  EKG Time: 1314  Rate:83  Rhythm: normal sinus rhythm  Axis: normal  Intervals:none  ST&T Change: no ST elevation  ____________________________________________  RADIOLOGY  ED MD interpretation:    No acute abnormality per radiology.  I, Sherrie George, personally viewed and evaluated these images (plain radiographs) as part of my medical decision making, as well as reviewing the written report by the radiologist.  Official radiology report(s): CT Head Wo Contrast  Result Date: 01/14/2020 CLINICAL DATA:  Altered mental status. EXAM: CT HEAD WITHOUT CONTRAST TECHNIQUE: Contiguous axial images were obtained from the base of the skull through the vertex without intravenous contrast. COMPARISON:  May 19, 2019 FINDINGS: Brain: No evidence of acute infarction, hemorrhage, hydrocephalus, extra-axial collection or mass lesion/mass effect. Moderate brain parenchymal volume loss and deep white matter microangiopathy. Again seen is left cerebellopontine angle extra-axial hyperdense 2 cm mass thought to represent a meningioma. Vascular: Intra cavernous carotid artery atherosclerotic calcifications. Skull: Normal. Negative for fracture or focal lesion. Sinuses/Orbits: No acute finding. Other: None. IMPRESSION: 1. No acute intracranial abnormality. 2. Atrophy, chronic microvascular disease. 3. Stable left cerebellopontine angle extra-axial hyperdense 2 cm mass thought to represent a meningioma. Electronically Signed   By: Fidela Salisbury M.D.   On: 01/14/2020 14:44     ____________________________________________   PROCEDURES  Procedure(s) performed (including Critical Care):  Procedures  ____________________________________________   INITIAL IMPRESSION / ASSESSMENT AND PLAN     86 year old female presenting to the emergency department for treatment and evaluation after son noticed change in her mental status.  Last known well was 2 days ago.  See HPI for further details.  DIFFERENTIAL DIAGNOSIS  CVA; encephalitis; dementia  ED COURSE  Head CT shows no acute findings of concern.  Labs are overall reassuring.  Throughout emergency department stay, she did begin to follow some commands but her  speech remained unclear and she could not answer direct questions.  Plan will be to admit her through the hospitalist services for CVA work-up.  Son is at bedside and agrees to this plan. ____________________________________________   FINAL CLINICAL IMPRESSION(S) / ED DIAGNOSES  Final diagnoses:  Altered mental status, unspecified altered mental status type     ED Discharge Orders    None       Tempest Carine Guillet was evaluated in Emergency Department on 01/14/2020 for the symptoms described in the history of present illness. She was evaluated in the context of the global COVID-19 pandemic, which necessitated consideration that the patient might be at risk for infection with the SARS-CoV-2 virus that causes COVID-19. Institutional protocols and algorithms that pertain to the evaluation of patients at risk for COVID-19 are in a state of rapid change based on information released by regulatory bodies including the CDC and federal and state organizations. These policies and algorithms were followed during the patient's care in the ED.   Note:  This document was prepared using Dragon voice recognition software and may include unintentional dictation errors.   Victorino Dike, FNP 01/14/20 2049    Arta Silence, MD 01/15/20 1525

## 2020-01-14 NOTE — ED Provider Notes (Signed)
ED ECG REPORT I, Arta Silence, the attending physician, personally viewed and interpreted this ECG.  Date: 01/14/2020 EKG Time: 1314 Rate: 83 Rhythm: normal sinus rhythm QRS Axis: normal Intervals: normal ST/T Wave abnormalities: Repolarization abnormality Narrative Interpretation: Nonspecific abnormalities with no evidence of acute ischemia    Arta Silence, MD 01/14/20 1336

## 2020-01-14 NOTE — ED Notes (Signed)
Pt taken to CT via stretcher.

## 2020-01-14 NOTE — ED Notes (Signed)
Pt had soft brown BM. Cleaned with warm wipes and placed new brief on pt.

## 2020-01-14 NOTE — ED Notes (Signed)
Spoke to receiving RN, Shirlean Mylar. Waiting on low bed for pt. Provided Shirlean Mylar with this RN's number to call back when low bed is placed in inpatient room.

## 2020-01-14 NOTE — Progress Notes (Signed)
PHARMACIST - PHYSICIAN COMMUNICATION  CONCERNING:  Enoxaparin (Lovenox) for DVT Prophylaxis    RECOMMENDATION: Patient was prescribed enoxaprin 40mg  q24 hours for VTE prophylaxis.   Filed Weights   01/14/20 1243  Weight: 57.2 kg (126 lb 1.7 oz)    Body mass index is 20.35 kg/m.  Estimated Creatinine Clearance: 23.4 mL/min (A) (by C-G formula based on SCr of 1.33 mg/dL (H)).   Patient is candidate for enoxaparin 30mg  every 24 hours based on CrCl <71ml/min or Weight less then 45kg for women or <57kg for men   DESCRIPTION: Pharmacy has adjusted enoxaparin dose per [facility] policy.  Patient is now receiving enoxaparin 30mg  every 24 hours.  Prudy Feeler, RPh Clinical Pharmacist  01/14/2020 5:24 PM

## 2020-01-14 NOTE — H&P (Signed)
History and Physical    Joelie Ihrig [id number] DOB: 22-Sep-1923 DOA: 01/14/2020  PCP: Hortencia Pilar, MD   Patient coming from: Home, lives alone and independent in ADLs.  I have personally briefly reviewed patient's old medical records in [place]  Chief Complaint: Altered mental status.  HPI: Shaqunna Lingad is a 86 y.o. female with medical history significant of COPD, sick sinus syndrome s/p permanent pacemaker, Acoustic neuroma, meningioma, chronic back pain and TIAs was brought to ED with complaint of altered mental status.  Lives alone and independent for ADLs.  Son visited her today and noticed that she is quite more than her usual, while watching TV he noticed that she all of a sudden becomes stiff with eyes rolling back.  No other seizure-like activity noted.  Patient remained alert but appears confused with some garbled speech.  Patient is very hard of hearing and most of the history was obtained from son.  Per son no recent illnesses.  No sick contacts.  Per son she is having chronic diarrhea for a while.  No recent fever or chills.  Patient denies any pain.  Son was not sure whether she eat yesterday or not but he brought some breakfast this morning which she never ate.  Nobody checked on her yesterday, last time he spoke with her was on Friday and she was communicating normally.  Per son it looks like she is having difficulties finding words with some incoherent speech.  Per son she seems more alert but still does not communicate well.  ED Course: On arrival she was hemodynamically stable, mild tachypnea with respiratory rate of 25, labs positive for mild AKI with creatinine of 1.33, baseline seems to be around 1, white cell count of 3.1, troponin 34>>35, UA with some ketones and few bacteria with positive squamous epithelial cells.  COVID-19 was negative.  CT head with no acute intracranial abnormality, atrophy with chronic microvascular disease and a stable left  cerebellopontine angle extra-axial hyperdense 2 cm mass thought to represent a meningioma.  Review of Systems: As per HPI otherwise 10 point review of systems negative.   Past Medical History:  Diagnosis Date  . Acoustic neuroma [facility])    md notes says history of acoustic neuroma or meningioma followed up by yearly MRI  . Chronic back pain   . COPD (chronic obstructive pulmonary disease) ([place])   . Hyperlipidemia   . Knee joint replacement status, right 11/25/2016  . Polycythemia   . Polycythemia, secondary 04/26/2015  . Port-A-Cath in place   . Presence of permanent cardiac pacemaker   . PVD (peripheral vascular disease) ([place])   . Right wrist fracture 11/25/2016  . Thoracic compression fracture, closed, initial encounter ([place]) 11/25/2016  . TIA (transient ischemic attack)     Past Surgical History:  Procedure Laterality Date  . ABDOMINAL HYSTERECTOMY    . APPENDECTOMY    . BACK SURGERY    . cataracts    . CHOLECYSTECTOMY    . INTRAMEDULLARY (IM) NAIL INTERTROCHANTERIC Left 11/10/2018   Procedure: INTRAMEDULLARY (IM) NAIL INTERTROCHANTRIC;  Surgeon: Corky Mull, MD;  Location: [facility] ORS;  Service: Orthopedics;  Laterality: Left;  . PACEMAKER INSERTION Left 02/18/2015   Procedure: INSERTION PACEMAKER;  Surgeon: Isaias Cowman, MD;  Location: [facility] ORS;  Service: Cardiovascular;  Laterality: Left;  . REPLACEMENT TOTAL KNEE    . TONSILLECTOMY       reports that she has been smoking cigarettes. She has been smoking about 0.50 packs per  day. She has never used smokeless tobacco. She reports that she does not drink alcohol or use drugs.  Allergies  Allergen Reactions  . Citalopram Other (See Comments)    Reaction:  Fatigue   . Pneumococcal Vaccine Other (See Comments)    Doesn't remember what occurred.  . Pneumovax [Pneumococcal Polysaccharide Vaccine] Other (See Comments)    Reaction:  Unknown   . Requip [Ropinirole Hcl] Other (See Comments)    Reaction:  Dry mouth   .  Penicillins Rash and Other (See Comments)        Family History  Problem Relation Age of Onset  . Other Mother        Old age  . Other Father        Old age  . CVA Sister   . Heart attack Brother     Prior to Admission medications   Medication Sig Start Date End Date Taking? Authorizing Provider  acetaminophen (TYLENOL) 325 MG tablet Take 650 mg by mouth every 4 (four) hours as needed for mild pain or moderate pain.     [provider]  albuterol (PROVENTIL) (2.5 MG/3ML) 0.083% nebulizer solution Inhale 3 mLs into the lungs every 6 (six) hours as needed for wheezing or shortness of breath.     [provider]  atorvastatin (LIPITOR) 10 MG tablet Take 10 mg by mouth daily.    [provider]  clopidogrel (PLAVIX) 75 MG tablet Take 75 mg by mouth daily.    [provider]  furosemide (LASIX) 20 MG tablet Take 20 mg by mouth 2 (two) times daily.     [provider]  hydroxyurea (HYDREA) 500 MG capsule Take 500 mg by mouth every Monday, Wednesday, and Friday.     [provider]  potassium chloride (K-DUR) 10 MEQ tablet Take 10 mEq by mouth daily.    [provider]    Physical Exam: Vitals:   01/14/20 1530 01/14/20 1600 01/14/20 1630 01/14/20 1645  BP: (!) 156/68 (!) 166/73 (!) 151/70   Pulse:      Resp: (!) 24 (!) 25 (!) 35 (!) 21  Temp:      TempSrc:      SpO2:      Weight:      Height:        General: Vital signs reviewed.  Patient is frail elderly lady, in no acute distress and cooperative with exam.  Head: Normocephalic and atraumatic. Eyes: EOMI, conjunctivae normal, no scleral icterus.  ENMT: Mucous membranes are dry.  Neck: Supple, trachea midline, normal ROM, no JVD,  Cardiovascular: RRR, S1 normal, S2 normal, no murmurs, gallops, or rubs. Pulmonary/Chest: Clear to auscultation bilaterally, no wheezes, rales, or rhonchi. Abdominal: Soft, non-tender, non-distended, BS +,  Extremities: No lower extremity  edema bilaterally,  pulses symmetric and intact bilaterally. No cyanosis or clubbing. Neurological: A& but unable to answer orientation questions, Strength is normal and symmetric bilaterally, cranial nerve II-XII are grossly intact, no focal motor deficit. Psychiatric: Normal mood and affect.  Labs on Admission: I have personally reviewed following labs and imaging studies  CBC: Recent Labs  Lab 01/14/20 1245  WBC 3.1*  HGB 12.8  HCT 39.2  MCV 111.7*  PLT 99991111   Basic Metabolic Panel: Recent Labs  Lab 01/14/20 1245  NA 137  K 4.5  CL 102  CO2 23  GLUCOSE 142*  BUN 13  CREATININE 1.33*  CALCIUM 8.8*   GFR: Estimated Creatinine Clearance: 23.4 mL/min (A) (by C-G  formula based on SCr of 1.33 mg/dL (H)). Liver Function Tests: Recent Labs  Lab 01/14/20 1245  AST 23  ALT 10  ALKPHOS 75  BILITOT 1.0  PROT 6.3*  ALBUMIN 3.5   No results for input(s): LIPASE, AMYLASE in the last 168 hours. No results for input(s): AMMONIA in the last 168 hours. Coagulation Profile: No results for input(s): INR, PROTIME in the last 168 hours. Cardiac Enzymes: No results for input(s): CKTOTAL, CKMB, CKMBINDEX, TROPONINI in the last 168 hours. BNP (last 3 results) No results for input(s): PROBNP in the last 8760 hours. HbA1C: No results for input(s): HGBA1C in the last 72 hours. CBG: No results for input(s): GLUCAP in the last 168 hours. Lipid Profile: No results for input(s): CHOL, HDL, LDLCALC, TRIG, CHOLHDL, LDLDIRECT in the last 72 hours. Thyroid Function Tests: No results for input(s): TSH, T4TOTAL, FREET4, T3FREE, THYROIDAB in the last 72 hours. Anemia Panel: No results for input(s): VITAMINB12, FOLATE, FERRITIN, TIBC, IRON, RETICCTPCT in the last 72 hours. Urine analysis:    Component Value Date/Time   COLORURINE YELLOW (A) 01/14/2020 1515   APPEARANCEUR CLOUDY (A) 01/14/2020 1515   LABSPEC 1.013 01/14/2020 1515   PHURINE 7.0 01/14/2020 [place] 01/14/2020  1515   HGBUR NEGATIVE 01/14/2020 1515   BILIRUBINUR NEGATIVE 01/14/2020 1515   KETONESUR 5 (A) 01/14/2020 1515   PROTEINUR 30 (A) 01/14/2020 1515   NITRITE NEGATIVE 01/14/2020 1515   LEUKOCYTESUR NEGATIVE 01/14/2020 1515    Radiological Exams on Admission: CT Head Wo Contrast  Result Date: 01/14/2020 CLINICAL DATA:  Altered mental status. EXAM: CT HEAD WITHOUT CONTRAST TECHNIQUE: Contiguous axial images were obtained from the base of the skull through the vertex without intravenous contrast. COMPARISON:  May 19, 2019 FINDINGS: Brain: No evidence of acute infarction, hemorrhage, hydrocephalus, extra-axial collection or mass lesion/mass effect. Moderate brain parenchymal volume loss and deep white matter microangiopathy. Again seen is left cerebellopontine angle extra-axial hyperdense 2 cm mass thought to represent a meningioma. Vascular: Intra cavernous carotid artery atherosclerotic calcifications. Skull: Normal. Negative for fracture or focal lesion. Sinuses/Orbits: No acute finding. Other: None. IMPRESSION: 1. No acute intracranial abnormality. 2. Atrophy, chronic microvascular disease. 3. Stable left cerebellopontine angle extra-axial hyperdense 2 cm mass thought to represent a meningioma. Electronically Signed   By: Fidela Salisbury M.D.   On: 01/14/2020 14:44    EKG: Independently reviewed.  Notes rhythm with some repolarization abnormalities.  No ST changes.  Assessment/Plan Active Problems:   Altered mental status, unspecified   Altered mental status.  Unknown etiology.  Patient seems little dry with 5 ketones in urine.  No leukocytosis, afebrile.  No focal neurologic deficit but per son she was garbling before.  CT head negative for any acute abnormalities. Unable to do MRI due to pacemaker. -Admit to MedSurg. -Give her heart healthy diet after bedside swallow evaluation. -If concerned till tomorrow we can try doing CT brain with contrast-I will avoid at this time due to  AKI. -Give her some gentle fluid. -Check TSH, magnesium and phosphorous. -Check EEG-no prior history of seizures. -PT/OT evaluation  AKI with CKD stage IIIa.  Attending mildly elevated at 1.33 with baseline around 1-1.1.  Most likely secondary to dehydration. -Gently hydrate. -Monitor Renal function -Avoid nephrotoxins  Peripheral arterial disease. -Continue Plavix and Lipitor.  History of diastolic dysfunction.  Prior echo done in 2016 was with normal EF and grade 1 diastolic dysfunction.  Patient uses Lasix 20 mg daily for lower extremity edema.  Currently  appears euvolemic with dry mucous membranes.  History of polycythemia with JAK2 mutation.  Being followed up by hematology. Hemoglobin 12.8 -Continue home dose of hydroxyurea.  History of paroxysmal A. Fib.  Not on any anticoagulation.  Per cardiology note patient and her family refused anticoagulation.   -No acute concern.  DVT prophylaxis: Lovenox Code Status: Full code confirmed with son, patient was DNR during prior hospitalization last year. Family Communication: Son was present at bedside Disposition Plan: Home Consults called: None Admission status: Observation   Lorella Nimrod MD [facility]  If 7PM-7AM, please contact night-coverage [email or web address]  01/14/2020, 5:32 PM   This record has been created using Systems analyst. Errors have been sought and corrected,but may not always be located. Such creation errors do not reflect on the standard of care.

## 2020-01-14 NOTE — ED Notes (Signed)
Patient vomited, yellowish in color. RN and NP notified

## 2020-01-14 NOTE — ED Triage Notes (Signed)
Pt arrives [facility] from car where pt was with son. Son told EMS that pt eyes rolled back into her head. Denies hx of seizures. Poss seizure today. Pt has a pacemaker and port per EMS. Sees [facility] for a blood disorder. CBG 193, SR, initially unresponsive. Responsive in ambulance. Was placed on a nonrebreather by EMS.

## 2020-01-15 ENCOUNTER — Observation Stay: Payer: Medicare Other

## 2020-01-15 DIAGNOSIS — R197 Diarrhea, unspecified: Secondary | ICD-10-CM | POA: Diagnosis present

## 2020-01-15 DIAGNOSIS — R4182 Altered mental status, unspecified: Secondary | ICD-10-CM

## 2020-01-15 DIAGNOSIS — R05 Cough: Secondary | ICD-10-CM | POA: Diagnosis present

## 2020-01-15 DIAGNOSIS — I739 Peripheral vascular disease, unspecified: Secondary | ICD-10-CM | POA: Diagnosis present

## 2020-01-15 DIAGNOSIS — E785 Hyperlipidemia, unspecified: Secondary | ICD-10-CM | POA: Diagnosis present

## 2020-01-15 DIAGNOSIS — G9341 Metabolic encephalopathy: Secondary | ICD-10-CM | POA: Diagnosis present

## 2020-01-15 DIAGNOSIS — Z20822 Contact with and (suspected) exposure to covid-19: Secondary | ICD-10-CM | POA: Diagnosis present

## 2020-01-15 DIAGNOSIS — D45 Polycythemia vera: Secondary | ICD-10-CM | POA: Diagnosis not present

## 2020-01-15 DIAGNOSIS — I480 Paroxysmal atrial fibrillation: Secondary | ICD-10-CM | POA: Diagnosis present

## 2020-01-15 DIAGNOSIS — H9190 Unspecified hearing loss, unspecified ear: Secondary | ICD-10-CM | POA: Diagnosis present

## 2020-01-15 DIAGNOSIS — G9340 Encephalopathy, unspecified: Secondary | ICD-10-CM | POA: Diagnosis present

## 2020-01-15 DIAGNOSIS — D333 Benign neoplasm of cranial nerves: Secondary | ICD-10-CM | POA: Diagnosis present

## 2020-01-15 DIAGNOSIS — J42 Unspecified chronic bronchitis: Secondary | ICD-10-CM | POA: Diagnosis not present

## 2020-01-15 DIAGNOSIS — Z96651 Presence of right artificial knee joint: Secondary | ICD-10-CM | POA: Diagnosis present

## 2020-01-15 DIAGNOSIS — G8929 Other chronic pain: Secondary | ICD-10-CM | POA: Diagnosis present

## 2020-01-15 DIAGNOSIS — J449 Chronic obstructive pulmonary disease, unspecified: Secondary | ICD-10-CM | POA: Diagnosis present

## 2020-01-15 DIAGNOSIS — I495 Sick sinus syndrome: Secondary | ICD-10-CM | POA: Diagnosis present

## 2020-01-15 DIAGNOSIS — Z8673 Personal history of transient ischemic attack (TIA), and cerebral infarction without residual deficits: Secondary | ICD-10-CM | POA: Diagnosis not present

## 2020-01-15 DIAGNOSIS — D751 Secondary polycythemia: Secondary | ICD-10-CM | POA: Diagnosis present

## 2020-01-15 DIAGNOSIS — R54 Age-related physical debility: Secondary | ICD-10-CM | POA: Diagnosis present

## 2020-01-15 DIAGNOSIS — Z79899 Other long term (current) drug therapy: Secondary | ICD-10-CM | POA: Diagnosis not present

## 2020-01-15 DIAGNOSIS — R569 Unspecified convulsions: Secondary | ICD-10-CM | POA: Diagnosis present

## 2020-01-15 DIAGNOSIS — N1831 Chronic kidney disease, stage 3a: Secondary | ICD-10-CM | POA: Diagnosis present

## 2020-01-15 DIAGNOSIS — Z7902 Long term (current) use of antithrombotics/antiplatelets: Secondary | ICD-10-CM | POA: Diagnosis not present

## 2020-01-15 DIAGNOSIS — Z950 Presence of cardiac pacemaker: Secondary | ICD-10-CM | POA: Diagnosis not present

## 2020-01-15 DIAGNOSIS — R059 Cough, unspecified: Secondary | ICD-10-CM | POA: Diagnosis present

## 2020-01-15 DIAGNOSIS — M549 Dorsalgia, unspecified: Secondary | ICD-10-CM | POA: Diagnosis present

## 2020-01-15 DIAGNOSIS — N179 Acute kidney failure, unspecified: Secondary | ICD-10-CM | POA: Diagnosis present

## 2020-01-15 DIAGNOSIS — R443 Hallucinations, unspecified: Secondary | ICD-10-CM | POA: Diagnosis not present

## 2020-01-15 DIAGNOSIS — F17210 Nicotine dependence, cigarettes, uncomplicated: Secondary | ICD-10-CM | POA: Diagnosis present

## 2020-01-15 LAB — GLUCOSE, CAPILLARY: Glucose-Capillary: 89 mg/dL (ref 70–99)

## 2020-01-15 LAB — BASIC METABOLIC PANEL
Anion gap: 6 (ref 5–15)
BUN: 12 mg/dL (ref 8–23)
CO2: 28 mmol/L (ref 22–32)
Calcium: 8.6 mg/dL — ABNORMAL LOW (ref 8.9–10.3)
Chloride: 103 mmol/L (ref 98–111)
Creatinine, Ser: 1.12 mg/dL — ABNORMAL HIGH (ref 0.44–1.00)
GFR calc Af Amer: 49 mL/min — ABNORMAL LOW (ref >60)
GFR calc non Af Amer: 42 mL/min — ABNORMAL LOW (ref >60)
Glucose, Bld: 94 mg/dL (ref 70–99)
Potassium: 3.2 mmol/L — ABNORMAL LOW (ref 3.5–5.1)
Sodium: 137 mmol/L (ref 135–145)

## 2020-01-15 MED ORDER — LEVETIRACETAM 500 MG PO TABS
500.0000 mg | ORAL_TABLET | Freq: Two times a day (BID) | ORAL | Status: DC
  Administered 2020-01-15 – 2020-01-16 (×2): 500 mg via ORAL
  Filled 2020-01-15 (×3): qty 1

## 2020-01-15 MED ORDER — POTASSIUM CHLORIDE CRYS ER 20 MEQ PO TBCR
40.0000 meq | EXTENDED_RELEASE_TABLET | Freq: Once | ORAL | Status: AC
  Administered 2020-01-15: 40 meq via ORAL
  Filled 2020-01-15: qty 2

## 2020-01-15 NOTE — Plan of Care (Addendum)
Pt admitted from home alone w/altered mental status.  Pt is extremely HOH which makes it difficult for her to follow commands. Pt has pacemaker which prohibits MRI, may do a CT of brain w/contrast today. Believe pt needs to be assisted for ability to care for herself at home alone.  Son Krista Ortiz checks on her weekly.

## 2020-01-15 NOTE — TOC Initial Note (Signed)
Transition of Care [facility]) - Initial/Assessment Note    Patient Details  Name: Krista Ortiz MRN: [id number] Date of Birth: [birth year]  [facility] CM/SW Contact:    Shelbie Hutching, RN Phone Number: 01/15/2020, 12:08 PM  Clinical Narrative:                 Patient placed in observation for altered mental status.  Patient is from home where she lives alone.  Patient stays in an apartment on [address] in [place].  Patient walks with a walker, she also has an electric wheelchair that she uses sometimes to go to the mailbox per her son.  Patient has a nebulizer and wears oxygen just at night.  Patient's son are both at the bedside, patient is very hard of hearing.  Patient worked well with PT.  This RNCM will arrange home health services before discharge.  Patient's son Gwyndolyn Saxon, reports that he will be able to stay with his mother when she is discharged so she will not be alone.   Expected Discharge Plan: [place] Barriers to Discharge: Continued Medical Work up   Patient Goals and CMS Choice   CMS Medicare.gov Compare Post Acute Care list provided to:: Patient Represenative (must comment) Choice offered to / list presented to : Adult Children  Expected Discharge Plan and Services Expected Discharge Plan: [place]   Discharge Planning Services: CM Consult Post Acute Care Choice: [place] arrangements for the past 2 months: Apartment                                      Prior Living Arrangements/Services Living arrangements for the past 2 months: Apartment Lives with:: Self Patient language and need for interpreter reviewed:: Yes Do you feel safe going back to the place where you live?: Yes      Need for Family Participation in Patient Care: Yes (Comment)(weakness) Care giver support system in place?: Yes (comment)(sons) Current home services: DME(walker, electric wheelchair, nebulizer, oxygen) Criminal  Activity/Legal Involvement Pertinent to Current Situation/Hospitalization: No - Comment as needed  Activities of Daily Living Home Assistive Devices/Equipment: Environmental consultant (specify type), Hearing aid, Eyeglasses, Dentures (specify type), Shower chair with back, Oxygen ADL Screening (condition at time of admission) Patient's cognitive ability adequate to safely complete daily activities?: Yes Is the patient deaf or have difficulty hearing?: Yes Does the patient have difficulty seeing, even when wearing glasses/contacts?: No Does the patient have difficulty concentrating, remembering, or making decisions?: Yes Patient able to express need for assistance with ADLs?: No Does the patient have difficulty dressing or bathing?: No Independently performs ADLs?: Yes (appropriate for developmental age) Does the patient have difficulty walking or climbing stairs?: Yes Weakness of Legs: None Weakness of Arms/Hands: None  Permission Sought/Granted Permission sought to share information with : Case Manager, Family Supports, Other (comment) Permission granted to share information with : Yes, Verbal Permission Granted  Share Information with NAME: Jeneen Rinks and Gwyndolyn Saxon  Permission granted to share info w AGENCY: [place] granted to share info w Relationship: sons     Emotional Assessment Appearance:: Appears stated age Attitude/Demeanor/Rapport: Engaged Affect (typically observed): Accepting Orientation: : Oriented to Self, Oriented to Place Alcohol / Substance Use: Not Applicable Psych Involvement: No (comment)  Admission diagnosis:  Altered mental status, unspecified altered mental status type [R41.82] Altered mental status, unspecified [R41.82]  Patient Active Problem List   Diagnosis Date Noted  . Altered mental status, unspecified 01/14/2020  . Hip fracture ([place]) 11/09/2018  . COPD (chronic obstructive pulmonary disease) ([place]) 02/16/2017  . COPD exacerbation ([place]) 02/15/2017   . Polycythemia 04/02/2016  . Back ache 10/03/2015  . Chronic obstructive pulmonary disease ([place]) 10/03/2015  . Dizziness 10/03/2015  . Decrease in the ability to hear 10/03/2015  . Neoplasm of meninges 10/03/2015  . Combined fat and carbohydrate induced hyperlipemia 10/03/2015  . Neuroma 10/03/2015  . Impaired vision 10/03/2015  . Presence of other vascular implants and grafts 10/03/2015  . Current smoker 10/03/2015  . Temporary cerebral vascular dysfunction 10/03/2015  . Abnormal CBC 06/12/2015  . Chronic LBP 06/12/2015  . Polycythemia vera ([place]) 04/26/2015  . Hypersensitivity to pneumococcal vaccine 02/21/2015  . Carotid artery narrowing 02/19/2015  . Sick sinus syndrome ([place]) 02/16/2015  . Syncope and collapse 02/15/2015  . Edema leg 10/25/2014  . TI (tricuspid incompetence) 10/25/2014  . Breathlessness on exertion 10/25/2014  . Chest pain at rest 10/03/2014  . Dyspnea, paroxysmal nocturnal 10/03/2014  . Amnesia 04/02/2014  . Abnormal weight loss 04/02/2014  . Osteopenia 03/15/2014  . Chronic pain 07/06/2013  . Peripheral vascular disease ([place]) 06/27/2013  . Ejection murmur 12/09/2012   PCP:  Hortencia Pilar, MD Pharmacy:   [facility] [phone] - [address] [place] [address] Phone: [phone] Fax: [phone]     Social Determinants of Health (SDOH) Interventions    Readmission Risk Interventions No flowsheet data found.

## 2020-01-15 NOTE — Progress Notes (Signed)
Neurology-Addendum: EEG shows focal, intermittent slowing in the left fronto-temporal region.  Due to this abnormality and description of event, coupled with less than optimal imaging will start anticonvulsant therapy.  Recommendations: 1. Keppra 500mg  BID.  No load required. 2. Follow up with neurology on an outpatient basis.    [name], MD Neurology [phone]

## 2020-01-15 NOTE — Evaluation (Signed)
Physical Therapy Evaluation Patient Details Name: Krista Ortiz MRN: [id number] DOB: [birth year] Today's Date: 01/15/2020   History of Present Illness  Pt is a 86 y.o. female presenting to hospital with AMS (per chart, pt's eyes rolled back into her head and pt became stiff); pt also noted with chronic diarrhea for a while.  Pt admitted with AMS, AKI with CKD stage IIIa, and peripheral arterial disease.  PMH includes acoustic neuroma, chronic back pain, COPD, R joint replacement, L hip ORIF 11/10/18, pacemaker, R wrist fx 2018, thoracic compression fx, impaired vision.  Clinical Impression  Prior to hospital admission, pt was ambulatory with rollator; lives alone but has support from her son; uses 2 L O2 via nasal cannula at night.  Currently pt is CGA to SBA with transfers and CGA with ambulation 100 feet with RW.  Overall pt steady with ambulation; generalized weakness noted.  Pt would benefit from skilled PT to address noted impairments and functional limitations (see below for any additional details).  Upon hospital discharge, pt would benefit from [place] and support from family.    Follow Up Recommendations Home health PT;Supervision - Intermittent    Equipment Recommendations  Rolling walker with 5" wheels;3in1 (PT)    Recommendations for Other Services OT consult     Precautions / Restrictions Precautions Precautions: Fall Precaution Comments: R chest port Restrictions Weight Bearing Restrictions: No      Mobility  Bed Mobility             General bed mobility comments: Deferred (pt in recliner beginning/end of session)  Transfers Overall transfer level: Needs assistance Equipment used: Rolling walker (2 wheeled) Transfers: Sit to/from Stand Sit to Stand: Min guard;Supervision       General transfer comment: x3 trials standing from recliner; fairly strong and steady transfers noted  Ambulation/Gait Ambulation/Gait assistance: Min guard Gait Distance (Feet):  100 Feet Assistive device: Rolling walker (2 wheeled)   Gait velocity: decreased   General Gait Details: partial step through gait pattern; decreased B LE step length/foot clearance; overall steady and safe ambulation with RW use  Stairs            Wheelchair Mobility    Modified Rankin (Stroke Patients Only)       Balance Overall balance assessment: Needs assistance Sitting-balance support: No upper extremity supported;Feet supported Sitting balance-Leahy Scale: Normal Sitting balance - Comments: steady sitting reaching outside BOS   Standing balance support: No upper extremity supported Standing balance-Leahy Scale: Good Standing balance comment: steady standing reaching within BOS                             Pertinent Vitals/Pain Pain Assessment: Faces Faces Pain Scale: No hurt Pain Intervention(s): Limited activity within patient's tolerance;Monitored during session;Repositioned  Vitals (HR and O2 on 2 L O2 via nasal cannula) stable and WFL throughout treatment session.    Home Living Family/patient expects to be discharged to:: Private residence Living Arrangements: Alone Available Help at Discharge: Family;Available PRN/intermittently(pt's son can help some but also works) Type of Home: Apartment Home Access: Level entry     Home Layout: One [place]: Walker - 4 wheels;Grab bars - tub/shower;Shower seat;Grab bars - toilet      Prior Function Level of Independence: Independent with assistive device(s)         Comments: Ambulatory with rollator.  Son assists weekly with IADL's.     Hand Dominance   Dominant Hand: Right  Extremity/Trunk Assessment   Upper Extremity Assessment Upper Extremity Assessment: Defer to OT evaluation    Lower Extremity Assessment Lower Extremity Assessment: Generalized weakness    Cervical / Trunk Assessment Cervical / Trunk Assessment: Kyphotic  Communication   Communication: HOH   Cognition Arousal/Alertness: Awake/alert Behavior During Therapy: WFL for tasks assessed/performed Overall Cognitive Status: Within Functional Limits for tasks assessed                                        General Comments General comments (skin integrity, edema, etc.): SpO2 97% on 2L [place] after BSC t/f .  Nursing cleared pt for participation in physical therapy.  Pt agreeable to PT session.  Pt's son present during session.    Exercises    Assessment/Plan    PT Assessment Patient needs continued PT services  PT Problem List Decreased strength;Decreased balance;Decreased activity tolerance;Decreased mobility;Decreased knowledge of use of DME       PT Treatment Interventions DME instruction;Gait training;Functional mobility training;Therapeutic activities;Therapeutic exercise;Balance training;Patient/family education    PT Goals (Current goals can be found in the Care Plan section)  Acute Rehab PT Goals Patient Stated Goal: To return home PT Goal Formulation: With patient Time For Goal Achievement: 01/29/20 Potential to Achieve Goals: Good    Frequency Min 2X/week   Barriers to discharge        Co-evaluation               AM-PAC PT "6 Clicks" Mobility  Outcome Measure Help needed turning from your back to your side while in a flat bed without using bedrails?: None Help needed moving from lying on your back to sitting on the side of a flat bed without using bedrails?: None Help needed moving to and from a bed to a chair (including a wheelchair)?: A Little Help needed standing up from a chair using your arms (e.g., wheelchair or bedside chair)?: A Little Help needed to walk in hospital room?: A Little Help needed climbing 3-5 steps with a railing? : A Little 6 Click Score: 20    End of Session Equipment Utilized During Treatment: Gait belt;Oxygen(2 L O2 via nasal cannula) Activity Tolerance: Patient tolerated treatment well Patient left: in  chair;with call bell/phone within reach;with chair alarm set;Other (comment)(B heels floating via pillow) Nurse Communication: Mobility status;Precautions PT Visit Diagnosis: Other abnormalities of gait and mobility (R26.89);Muscle weakness (generalized) (M62.81);History of falling (Z91.81)    Time: [id number] PT Time Calculation (min) (ACUTE ONLY): 27 min   Charges:   PT Evaluation $PT Eval Low Complexity: 1 Low PT Treatments $Therapeutic Activity: 8-22 mins        Leitha Bleak, PT 01/15/20, 1:11 PM

## 2020-01-15 NOTE — Procedures (Signed)
ELECTROENCEPHALOGRAM REPORT   Patient: Krista Ortiz       Room #: 109A-AA EEG No. ID: [id number] Age: 86 y.o.        Sex: female Requesting Physician: Arbutus Ped Report Date:  01/15/2020        Interpreting Physician: [name]  History: [name] is an 86 y.o. female with seizure like activity  Medications:  Plavix, Hydroxyurea, Lipitor  Conditions of Recording:  This is a 21 channel routine scalp EEG performed with bipolar and monopolar montages arranged in accordance to the international 10/20 system of electrode placement. One channel was dedicated to EKG recording.  The patient is in the awake state.  Description:  The waking background activity consists of a low voltage, poorly sustained 8 Hz alpha activity, seen from the parieto-occipital and posterior temporal regions.  Low voltage fast activity, poorly organized, is seen anteriorly and is at times superimposed on more posterior regions.  A mixture of theta and alpha rhythms are seen from the central and temporal regions.  There  Are noted intermittent periods of slowing that are ,most prominent over the left hemisphere and most specifically the left fronto-temporal region.  No epileptiform activity is noted.   The patient does not drowse or sleep. Hyperventilation was not performed.  Intermittent photic stimulation was performed but failed to illicit any change in the tracing.    IMPRESSION: This is an abnormal electroencephalogram secondary to intermittent slowing noted most notably in the left fronto-temporal region.  This finding is suggestive of a focal disturbance that is etiologically nonspecific, but may include stroke or mass lesion in that region.  No epileptiform activity is noted.     [name], MD Neurology [phone] 01/15/2020, 3:14 PM

## 2020-01-15 NOTE — Progress Notes (Addendum)
Patient was c/o of her cough, the sound was congested. Messaged Dr. Arbutus Ped, new order for chest x-ray.

## 2020-01-15 NOTE — Evaluation (Signed)
Occupational Therapy Evaluation Patient Details Name: Krista Ortiz MRN: [id number] DOB: 03-May-1924 Today's Date: 01/15/2020    History of Present Illness Krista Ortiz is a 86 y.o. female with medical history significant of COPD, sick sinus syndrome s/p permanent pacemaker, Acoustic neuroma, meningioma, chronic back pain and TIAs was brought to ED with complaint of altered mental status.   Clinical Impression   Ms Blankley was seen for OT evaluation this date. Per son at bed side prior to hospital admission, pt was MOD I for mobility and ADLs using Rollator. Pt has had no falls in last 6 months and lives alone. Pt presents to acute OT demonstrating impaired ADL performance and functional mobility 2/2 decreased safety awareness, functional fine motor coordination deficits, and inconctinence. Pt currently requires MOD A don/doff B socks at bed level and seated EOC. SETUP don gown seated in chair. SBA toileting at [facility] c MIN A for perihygiene/clothing manipulation. Pt requires SBA sup>sit and CGA + RW for SPT. After transferring to chair for breakfast pt had episode of bowel incontinence - OT and NT assisted pt to finish toileting on BSC. Pt would benefit from skilled OT to address noted impairments and functional limitations (see below for any additional details) in order to maximize safety and independence while minimizing falls risk and caregiver burden. Upon hospital discharge, recommend [place] c Supervision to maximize pt safety and return to functional independence during meaningful occupations of daily life.      Follow Up Recommendations  Home health OT;Supervision/Assistance - 24 hour    Equipment Recommendations  3 in 1 bedside commode    Recommendations for Other Services       Precautions / Restrictions Precautions Precautions: Fall Restrictions Weight Bearing Restrictions: No      Mobility Bed Mobility Overal bed mobility: Modified Independent              General bed mobility comments: HoB elevated - pt comes easily to EOB   Transfers Overall transfer level: Needs assistance Equipment used: Rolling walker (2 wheeled) Transfers: Sit to/from [facility] Sit to Stand: Min guard Stand pivot transfers: Min guard       General transfer comment: CGA + RW c VCs for RW management.     Balance Overall balance assessment: Needs assistance Sitting-balance support: No upper extremity supported;Feet supported Sitting balance-Leahy Scale: Good     Standing balance support: Bilateral upper extremity supported Standing balance-Leahy Scale: Good Standing balance comment: SBA + RW standing during functional activity decreasing to MIN A c no AD                           ADL either performed or assessed with clinical judgement   ADL Overall ADL's : Needs assistance/impaired                                       General ADL Comments: MOD A don/doff B socks. SETUP don gown seated in chair. SBA toileting at [facility] c MIN A for perihygiene/clothing manipulation.      Vision         Perception     Praxis      Pertinent Vitals/Pain Pain Assessment: No/denies pain     Hand Dominance Right   Extremity/Trunk Assessment Upper Extremity Assessment Upper Extremity Assessment: Overall WFL for tasks assessed(decreased [facility] for palm to finger translation of  small pills)   Lower Extremity Assessment Lower Extremity Assessment: Overall WFL for tasks assessed   Cervical / Trunk Assessment Cervical / Trunk Assessment: Kyphotic   Communication Communication Communication: HOH   Cognition Arousal/Alertness: Awake/alert Behavior During Therapy: WFL for tasks assessed/performed Overall Cognitive Status: Within Functional Limits for tasks assessed(Oriented to self/location - difficult to assess 2/2 HOH)                                     General Comments  SpO2 97% on 2L [place] after BSC t/f      Exercises Exercises: Other exercises Other Exercises Other Exercises: Pt and caregiver educated re: falls prevention, energy conservation, d/c recs, and importance of supervision during mobility Other Exercises: Toileting including perihygiene and briefs management, don/doff gown, don/doff B socks, bed mobility, sup>sit, sit<>stand x2, SPT bed>chair<>BSC   Shoulder Instructions      Home Living Family/patient expects to be discharged to:: Private residence Living Arrangements: Alone Available Help at Discharge: Family;Available PRN/intermittently(Son reports he can stay with her but works as Recruitment consultant) Type of Home: Apartment Home Access: Level entry     [place]: One level     Bathroom Shower/Tub: Occupational psychologist: Handicapped height Bathroom Accessibility: Yes How Accessible: Accessible via [place]: Walker - 4 wheels;Grab bars - tub/shower;Shower seat;Grab bars - toilet          Prior Functioning/Environment Level of Independence: Independent with assistive device(s)        Comments: Son reports pt is MOD I c Rollator for ADLs and mobility. Son assists weekly c IADLs. Per son - pt has chronic diarrhea and wears depends but they often leak and she is able to clean it up.         OT Problem List: Decreased activity tolerance;Impaired balance (sitting and/or standing);Decreased safety awareness;Decreased knowledge of use of DME or AE      OT Treatment/Interventions:      OT Goals(Current goals can be found in the care plan section) Acute Rehab OT Goals Patient Stated Goal: To return home OT Goal Formulation: With patient/family Time For Goal Achievement: 01/29/20 Potential to Achieve Goals: Good ADL Goals Pt Will Perform Lower Body Dressing: sit to/from stand;with modified independence(c LRAD PRN) Pt Will Transfer to Toilet: ambulating;regular height toilet;with supervision(c LRAD PRN) Pt Will Perform Toileting - Clothing  Manipulation and hygiene: with modified independence;sit to/from stand(c LRAD PRN) Additional ADL Goal #1: Pt will independently verbalize plan to implement x3 falls prevention strategies  OT Frequency: Min 1X/week   Barriers to D/C: Decreased caregiver support          Co-evaluation              AM-PAC OT "6 Clicks" Daily Activity     Outcome Measure Help from another person eating meals?: None Help from another person taking care of personal grooming?: None Help from another person toileting, which includes using toliet, bedpan, or urinal?: A Little Help from another person bathing (including washing, rinsing, drying)?: A Little Help from another person to put on and taking off regular upper body clothing?: A Little Help from another person to put on and taking off regular lower body clothing?: A Lot 6 Click Score: 19   End of Session Equipment Utilized During Treatment: Rolling walker;Oxygen(2L [place])  Activity Tolerance: Patient tolerated treatment well Patient left: in chair;with call bell/phone within reach;with chair  alarm set;with family/visitor present  OT Visit Diagnosis: Unsteadiness on feet (R26.81);Other abnormalities of gait and mobility (R26.89)                Time: [id number] OT Time Calculation (min): 55 min Charges:  OT General Charges $OT Visit: 1 Visit OT Evaluation $OT Eval Moderate Complexity: 1 Mod OT Treatments $Self Care/Home Management : 38-52 mins  Dessie Coma, M.S. OTR/L  01/15/20, 10:52 AM

## 2020-01-15 NOTE — Consult Note (Signed)
Reason for Consult:AMS Requesting Physician: Arbutus Ped  CC: Difficulty with speech and seizure like activity  I have been asked by Dr. Arbutus Ped to see this patient in consultation for AMS.  HPI: Krista Ortiz is an 86 y.o. female with medical history significant of COPD, sick sinus syndrome s/p pacemaker placement, Acoustic neuroma, meningioma, chronic back pain and TIAs who presents after being found altered by her son.  Patient lives alone.  Last spoken to be son on 5/7 at 3pm when patient was at baseline.  When he went to visit her yesterday she was sitting in chair as if about to fall out of the chair.  Speech was unintelligible.  This improved but later patient was noted to stiffen and her "eyes to roll back in her head".  EMS was called at that time.  On their arrival patient was improved but not yet responding normally.  Son reports patient now back to baseline but more irritable than usual.    Past Medical History:  Diagnosis Date  . Acoustic neuroma [facility])    md notes says history of acoustic neuroma or meningioma followed up by yearly MRI  . Chronic back pain   . COPD (chronic obstructive pulmonary disease) ([place])   . Hyperlipidemia   . Knee joint replacement status, right 11/25/2016  . Polycythemia   . Polycythemia, secondary 04/26/2015  . Port-A-Cath in place   . Presence of permanent cardiac pacemaker   . PVD (peripheral vascular disease) ([place])   . Right wrist fracture 11/25/2016  . Thoracic compression fracture, closed, initial encounter ([place]) 11/25/2016  . TIA (transient ischemic attack)     Past Surgical History:  Procedure Laterality Date  . ABDOMINAL HYSTERECTOMY    . APPENDECTOMY    . BACK SURGERY    . cataracts    . CHOLECYSTECTOMY    . INTRAMEDULLARY (IM) NAIL INTERTROCHANTERIC Left 11/10/2018   Procedure: INTRAMEDULLARY (IM) NAIL INTERTROCHANTRIC;  Surgeon: Corky Mull, MD;  Location: [facility] ORS;  Service: Orthopedics;  Laterality: Left;  . PACEMAKER  INSERTION Left 02/18/2015   Procedure: INSERTION PACEMAKER;  Surgeon: Isaias Cowman, MD;  Location: [facility] ORS;  Service: Cardiovascular;  Laterality: Left;  . REPLACEMENT TOTAL KNEE    . TONSILLECTOMY      Family History  Problem Relation Age of Onset  . Other Mother        Old age  . Other Father        Old age  . CVA Sister   . Heart attack Brother     Social History:  reports that she has been smoking cigarettes. She has been smoking about 0.50 packs per day. She has never used smokeless tobacco. She reports that she does not drink alcohol or use drugs.  Allergies  Allergen Reactions  . Citalopram Other (See Comments)    Reaction:  Fatigue   . Pneumococcal Vaccine Other (See Comments)    Doesn't remember what occurred.  . Pneumovax [Pneumococcal Polysaccharide Vaccine] Other (See Comments)    Reaction:  Unknown   . Requip [Ropinirole Hcl] Other (See Comments)    Reaction:  Dry mouth   . Penicillins Rash and Other (See Comments)         Medications:  I have reviewed the patient's current medications. Prior to Admission:  Medications Prior to Admission  Medication Sig Dispense Refill Last Dose  . acetaminophen (TYLENOL) 325 MG tablet Take 650 mg by mouth every 4 (four) hours as needed for mild pain or moderate pain.  Unknown at PRN  . atorvastatin (LIPITOR) 10 MG tablet Take 10 mg by mouth daily.   01/13/2020 at Unknown time  . ciprofloxacin (CIPRO) 500 MG tablet Take 500 mg by mouth 2 (two) times daily.   01/14/2020 at 0800  . clopidogrel (PLAVIX) 75 MG tablet Take 75 mg by mouth daily.   01/14/2020 at 0800  . furosemide (LASIX) 20 MG tablet Take 20 mg by mouth 2 (two) times daily.    01/14/2020 at 0800  . hydroxyurea (HYDREA) 500 MG capsule Take 500 mg by mouth every Monday, Wednesday, and Friday.    01/12/2020 at Unknown  . ipratropium-albuterol (DUONEB) 0.5-2.5 (3) MG/3ML SOLN Take 3 mLs by nebulization every 6 (six) hours as needed (shortness of breath or wheezing).    Unknown at PRN  . potassium chloride (K-DUR) 10 MEQ tablet Take 10 mEq by mouth 2 (two) times daily.    01/14/2020 at 0800   Scheduled: . atorvastatin  10 mg Oral Daily  . clopidogrel  75 mg Oral Daily  . enoxaparin (LOVENOX) injection  30 mg Subcutaneous Q24H  . hydroxyurea  500 mg Oral Q M,W,F  . potassium chloride  40 mEq Oral Once  . sodium chloride flush  3 mL Intravenous Q12H    ROS: History obtained from son  General ROS: negative for - chills, fatigue, fever, night sweats, weight gain or weight loss Psychological ROS: negative for - behavioral disorder, hallucinations, memory difficulties, mood swings or suicidal ideation Ophthalmic ROS: negative for - blurry vision, double vision, eye pain or loss of vision ENT ROS: hard of hearing Allergy and Immunology ROS: negative for - hives or itchy/watery eyes Hematological and Lymphatic ROS: negative for - bleeding problems, bruising or swollen lymph nodes Endocrine ROS: negative for - galactorrhea, hair pattern changes, polydipsia/polyuria or temperature intolerance Respiratory ROS: negative for - cough, hemoptysis, shortness of breath or wheezing Cardiovascular ROS: negative for - chest pain, dyspnea on exertion, edema or irregular heartbeat Gastrointestinal ROS: negative for - abdominal pain, diarrhea, hematemesis, nausea/vomiting or stool incontinence Genito-Urinary ROS: negative for - dysuria, hematuria, incontinence or urinary frequency/urgency Musculoskeletal ROS: negative for - joint swelling or muscular weakness Neurological ROS: as noted in HPI Dermatological ROS: negative for rash and skin lesion changes  Physical Examination: Blood pressure (!) 118/52, pulse 75, temperature 99.3 F (37.4 C), temperature source Oral, resp. rate 19, height 5\' 6"  (1.676 m), weight 57.2 kg, SpO2 100 %.  HEENT-  Normocephalic, no lesions, without obvious abnormality.  Normal external eye and conjunctiva.  Normal TM's bilaterally.  Normal  auditory canals and external ears. Normal external nose, mucus membranes and septum.  Normal pharynx. Cardiovascular- S1, S2 normal, pulses palpable throughout   Lungs- chest clear, no wheezing, rales, normal symmetric air entry Abdomen- soft, non-tender; bowel sounds normal; no masses,  no organomegaly Extremities- no edema Lymph-no adenopathy palpable Musculoskeletal-no joint tenderness, deformity or swelling Skin-warm and dry, no hyperpigmentation, vitiligo, or suspicious lesions  Neurological Examination   Mental Status: Alert, oriented to name and place.  Unable to perform full testing due to hearing.  Able to follow simple commands but requires reinforcement for 3-step commands.  Speech fluent without evidence of aphasia.   Cranial Nerves: II: Visual fields grossly normal III,IV, VI: ptosis not present, extra-ocular motions intact bilaterally V,VII: smile symmetric, facial light touch sensation normal bilaterally VIII: hearing normal bilaterally IX,X: gag reflex present XI: bilateral shoulder shrug XII: midline tongue extension Motor: Right : Upper extremity   5/5  Left:     Upper extremity   5/5  Lower extremity   5/5     Lower extremity   5/5 Tone and bulk:normal tone throughout; no atrophy noted Sensory: Pinprick and light touch intact throughout, bilaterally Deep Tendon Reflexes: Symmetric throughout Plantars: Right: mute   Left: mute Cerebellar: Normal finger-to-nose testing bilaterally Gait: not tested due to safety concerns  Laboratory Studies:   Basic Metabolic Panel: Recent Labs  Lab 01/14/20 1245 01/14/20 1909 01/15/20 0450  NA 137  --  137  K 4.5  --  3.2*  CL 102  --  103  CO2 23  --  28  GLUCOSE 142*  --  94  BUN 13  --  12  CREATININE 1.33*  --  1.12*  CALCIUM 8.8*  --  8.6*  MG  --  1.7  --   PHOS  --  2.5  --     Liver Function Tests: Recent Labs  Lab 01/14/20 1245  AST 23  ALT 10  ALKPHOS 75  BILITOT 1.0  PROT 6.3*  ALBUMIN 3.5    No results for input(s): LIPASE, AMYLASE in the last 168 hours. No results for input(s): AMMONIA in the last 168 hours.  CBC: Recent Labs  Lab 01/14/20 1245  WBC 3.1*  HGB 12.8  HCT 39.2  MCV 111.7*  PLT 365    Cardiac Enzymes: No results for input(s): CKTOTAL, CKMB, CKMBINDEX, TROPONINI in the last 168 hours.  BNP: Invalid input(s): POCBNP  CBG: Recent Labs  Lab 01/15/20 [id number]    Microbiology: Results for orders placed or performed during the hospital encounter of 01/14/20  Respiratory Panel by RT PCR (Flu A&B, Covid) - Nasopharyngeal Swab     Status: None   Collection Time: 01/14/20  4:20 PM   Specimen: Nasopharyngeal Swab  Result Value Ref Range Status   SARS Coronavirus 2 by RT PCR NEGATIVE NEGATIVE Final    Comment: (NOTE) SARS-CoV-2 target nucleic acids are NOT DETECTED. The SARS-CoV-2 RNA is generally detectable in upper respiratoy specimens during the acute phase of infection. The lowest concentration of SARS-CoV-2 viral copies this assay can detect is 131 copies/mL. A negative result does not preclude SARS-Cov-2 infection and should not be used as the sole basis for treatment or other patient management decisions. A negative result may occur with  improper specimen collection/handling, submission of specimen other than nasopharyngeal swab, presence of viral mutation(s) within the areas targeted by this assay, and inadequate number of viral copies (<131 copies/mL). A negative result must be combined with clinical observations, patient history, and epidemiological information. The expected result is Negative. Fact Sheet for Patients:  PinkCheek.be Fact Sheet for Healthcare Providers:  GravelBags.it This test is not yet ap proved or cleared by the [place] FDA and  has been authorized for detection and/or diagnosis of SARS-CoV-2 by FDA under an Emergency Use Authorization (EUA).  This EUA will remain  in effect (meaning this test can be used) for the duration of the COVID-19 declaration under Section 564(b)(1) of the Act, 21 U.S.C. section 360bbb-3(b)(1), unless the authorization is terminated or revoked sooner.    Influenza A by PCR NEGATIVE NEGATIVE Final   Influenza B by PCR NEGATIVE NEGATIVE Final    Comment: (NOTE) The Xpert Xpress SARS-CoV-2/FLU/RSV assay is intended as an aid in  the diagnosis of influenza from Nasopharyngeal swab specimens and  should not be used as a sole basis for treatment. Nasal washings and  aspirates are  unacceptable for Xpert Xpress SARS-CoV-2/FLU/RSV  testing. Fact Sheet for Patients: PinkCheek.be Fact Sheet for Healthcare Providers: GravelBags.it This test is not yet approved or cleared by the [place] FDA and  has been authorized for detection and/or diagnosis of SARS-CoV-2 by  FDA under an Emergency Use Authorization (EUA). This EUA will remain  in effect (meaning this test can be used) for the duration of the  Covid-19 declaration under Section 564(b)(1) of the Act, 21  U.S.C. section 360bbb-3(b)(1), unless the authorization is  terminated or revoked. Performed at [facility], [address] [place], [place] [address]     Coagulation Studies: No results for input(s): LABPROT, INR in the last 72 hours.  Urinalysis:  Recent Labs  Lab 01/14/20 1515  COLORURINE YELLOW*  LABSPEC 1.013  PHURINE 7.0  GLUCOSEU NEGATIVE  HGBUR NEGATIVE  BILIRUBINUR NEGATIVE  KETONESUR 5*  PROTEINUR 30*  NITRITE NEGATIVE  LEUKOCYTESUR NEGATIVE    Lipid Panel:     Component Value Date/Time   CHOL 102 01/12/2014 1317   TRIG 200 01/12/2014 1317   HDL 36 (L) 01/12/2014 1317   VLDL 40 01/12/2014 1317   LDLCALC 26 01/12/2014 1317    HgbA1C:  Lab Results  Component Value Date   HGBA1C 5.2 01/14/2020    Urine Drug Screen:  No results found for: LABOPIA,  COCAINSCRNUR, LABBENZ, AMPHETMU, THCU, LABBARB  Alcohol Level: No results for input(s): ETH in the last 168 hours.  Other results: EKG: sinus rhythm at 83 bpm.  Imaging: CT Head Wo Contrast  Result Date: 01/14/2020 CLINICAL DATA:  Altered mental status. EXAM: CT HEAD WITHOUT CONTRAST TECHNIQUE: Contiguous axial images were obtained from the base of the skull through the vertex without intravenous contrast. COMPARISON:  May 19, 2019 FINDINGS: Brain: No evidence of acute infarction, hemorrhage, hydrocephalus, extra-axial collection or mass lesion/mass effect. Moderate brain parenchymal volume loss and deep white matter microangiopathy. Again seen is left cerebellopontine angle extra-axial hyperdense 2 cm mass thought to represent a meningioma. Vascular: Intra cavernous carotid artery atherosclerotic calcifications. Skull: Normal. Negative for fracture or focal lesion. Sinuses/Orbits: No acute finding. Other: None. IMPRESSION: 1. No acute intracranial abnormality. 2. Atrophy, chronic microvascular disease. 3. Stable left cerebellopontine angle extra-axial hyperdense 2 cm mass thought to represent a meningioma. Electronically Signed   By: Fidela Salisbury M.D.   On: 01/14/2020 14:44   DG Chest Port 1 View  Result Date: 01/15/2020 CLINICAL DATA:  Nonproductive cough. EXAM: PORTABLE CHEST 1 VIEW COMPARISON:  Chest x-ray dated November 09, 2018. FINDINGS: Unchanged left chest wall pacemaker and right chest wall port catheter. The heart is at the upper limits of normal in size. Atherosclerotic calcification of the aortic arch. Normal pulmonary vascularity. The lungs remain hyperinflated with chronically coarsened interstitial markings. New mild opacity at the right lung base. New trace left pleural effusion. No pneumothorax. No acute osseous abnormality. Old left posterior sixth and seventh rib fractures, new since the prior study. IMPRESSION: 1. Mild right basilar atelectasis versus infiltrate. 2. Trace  left pleural effusion. 3. COPD. Electronically Signed   By: Titus Dubin M.D.   On: 01/15/2020 11:30     Assessment/Plan: 86 year old female with medical history significant of COPD, sick sinus syndrome s/p permanent pacemaker, Acoustic neuroma, meningioma, chronic back pain and TIAs who presents with altered mental status and seizure like activity.  Head CT personally reviewed and shows no evidence of acute changes.  MRI unable to be performed due to the presence of a pacemaker  therefore can not rule out the possibility of a small embolic event that can not be visualized on CT or TIA.  Larger event lot likely since patient with nonfocal neurological examination.  Seizure is on the differential as well.  Lab work is unremarkable including serum magnesium, serum phosphorus and TSH.  Etiology undetermined.    Recommendations: 1. EEG.  Anticonvulsant therapy not indicated at this time.   2. ASA 81 mg and Plavix 75mg  daily for three weeks before return to Plavix 75mg  daily 3. Telemetry 4. Frequent neuro checks 5. Seizure precautions 6. Orthostatic vitals    [name], MD Neurology [phone] 01/15/2020, 12:41 PM

## 2020-01-15 NOTE — Hospital Course (Signed)
Giovanna Noelia Kalfas is a 86 y.o. female with medical history of COPD, sick sinus syndrome s/p permanent pacemaker, polycythemia vera, acoustic neuroma, meningioma, chronic back pain and TIAs was brought to ED on 01/14/20 with complaint of altered mental status after son witness seizure-like activity while check in on patient.  Lives alone and independent for ADLs.  Son noted patient was less responsive than usual, and had not filled her pill box or done other daily routine activities, found her asleep slumped forward in her recliner.  She woke up and they were watching TV when son saw patient's whole body become "stiff as a board" with eyes rolled back, which lasted about a minute or so.  Following this, patient was lethargic and had garbled speech.  No history of seizures.  Son initially reported no recent illnesses, but later recalled she's been take Cipro for the past 2 months for persistent ear infection.  She had been having frequent watery diarrhea at home for some time, unsure how long.  No recent fevers, chills, or cough.  In the ED, hemodynamically stable.  Labs showed mild AKI, no leukocytosis, mildly elevated but flat troponins (patient with no chest pain), UA showed ketones, few bacteria and epi cells.  CT head with no acute intracranial abnormality, did show atrophy with chronic microvascular disease and a stable size meningioma.  Admitted to hospitalist service for further evaluation and management.

## 2020-01-15 NOTE — Progress Notes (Signed)
eeg completed ° °

## 2020-01-15 NOTE — Progress Notes (Signed)
PROGRESS NOTE    Krista Ortiz   [id number]  DOB: 05-28-1924  PCP: Hortencia Pilar, MD    DOA: 01/14/2020 LOS: 0   Brief Narrative   Krista Ortiz is a 86 y.o. female with medical history of COPD, sick sinus syndrome s/p permanent pacemaker, polycythemia vera, acoustic neuroma, meningioma, chronic back pain and TIAs was brought to ED on 01/14/20 with complaint of altered mental status after son witness seizure-like activity while check in on patient.  Lives alone and independent for ADLs.  Son noted patient was less responsive than usual, and had not filled her pill box or done other daily routine activities, found her asleep slumped forward in her recliner.  She woke up and they were watching TV when son saw patient's whole body become "stiff as a board" with eyes rolled back, which lasted about a minute or so.  Following this, patient was lethargic and had garbled speech.  No history of seizures.  Son initially reported no recent illnesses, but later recalled she's been take Cipro for the past 2 months for persistent ear infection.  She had been having frequent watery diarrhea at home for some time, unsure how long.  No recent fevers, chills, or cough.  In the ED, hemodynamically stable.  Labs showed mild AKI, no leukocytosis, mildly elevated but flat troponins (patient with no chest pain), UA showed ketones, few bacteria and epi cells.  CT head with no acute intracranial abnormality, did show atrophy with chronic microvascular disease and a stable size meningioma.  Admitted to hospitalist service for further evaluation and management.      Assessment & Plan   Principal Problem:   Altered mental status, unspecified Active Problems:   Seizure-like activity (HCC)   Diarrhea   Cough   Sick sinus syndrome (HCC)   Polycythemia vera (HCC)   Peripheral vascular disease (HCC)   COPD (chronic obstructive pulmonary disease) (HCC)   Altered mental status - present on admission,  possibly post-ictal but etiology uncertain.  Concern for seizure activity based on history.   --Neurology consulted --f/u EEG --unable to get MRI due to PM --PT/OT/SLP evaluations --Neurochecks  Seizure-like activity - see narrative above.  No history of seizures.  She has been on Cipro for 2 months which can lower seizure threshold.  Syncopal episode also on differential, but patient was reportedly post-ictal / lethargic following episode.  TSH normal.  Phos and Mg normal.   --EEG pending & neuro following  Diarrhea - has been on Cipro for 2 months for ear infection.   --check C diff  Cough - unclear if present on admission, but RN reported wet-sounding cough this AM (5/10).  Chest xray showed right base small infiltrate vs atelectasis.  Suspect aspiration during seizure-like episode or when patient vomited last night.  Given checking for C diff, will hold off on antibiotics for now.  She is PCN-allergic. --monitor respiratory status --await C diff result & monitor before starting antibiotics  Sick sinus syndrome - has PM.  Appears stable.  Telemetry.  Polycythemia vera - continue home Hydoxyurea.  Hbg normal.  Follow CBC.  Peripheral vascular disease - continue home Plavix, Lipitor  COPD - not acutely exacerbated.  Continue home Duonebs PRN.  Patient BMI: Body mass index is 20.35 kg/m.   DVT prophylaxis: Lovenox  Diet:  Diet Orders (From admission, onward)    Start     Ordered   01/14/20 2122  Diet Heart Room service appropriate? Yes; Fluid consistency: Thin  Diet effective now    Question Answer Comment  Room service appropriate? Yes   Fluid consistency: Thin      01/14/20 2122            Code Status: Full Code    Subjective 01/15/20    Patient seen up in chair, her two sons present.  Sons provide further history (included in above narrative), specifically the 2 months of Cipro use and diarrhea.  Patient denies fever/chills, chest pain, SOB, N/V other other  complaints.  She does report frequent watery diarrhea with urgency.  Denies respiratory symptoms recently, but endorses cough today.   Disposition Plan & Communication    Status is: Inpatient  Remains inpatient appropriate because:Ongoing diagnostic testing needed not appropriate for outpatient work up   Dispo: The patient is from: Home              Anticipated d/c is to: Home              Anticipated d/c date is: 2 days              Patient currently is not medically stable to d/c.   Family Communication: Two sons at bedside during encounter, updated in detail and questions answered.  They are in agreement with the plan as outlined.   Consults, Procedures, Significant Events   Consultants:   Neurology  Procedures:   None  Antimicrobials:   None   Objective   Vitals:   01/14/20 1745 01/14/20 1846 01/14/20 2307 01/15/20 0719  BP:  (!) 177/64 118/83 (!) 118/52  Pulse:  97 81 75  Resp: 20  17 19   Temp:  98.8 F (37.1 C) 98.3 F (36.8 C) 99.3 F (37.4 C)  TempSrc:  Oral Oral Oral  SpO2:  (!) 85% 93% 100%  Weight:      Height:        Intake/Output Summary (Last 24 hours) at 01/15/2020 1420 Last data filed at 01/15/2020 1043 Gross per 24 hour  Intake 824.46 ml  Output 50 ml  Net 774.46 ml   Filed Weights   01/14/20 1243  Weight: 57.2 kg    Physical Exam:  General exam: awake, alert, no acute distress, frail HEENT: moist mucus membranes, severe hearing impairment  Respiratory system: decreased breath sounds, no wheezes, rales or rhonchi, normal respiratory effort. Cardiovascular system: normal S1/S2, RRR, no JVD, murmurs, rubs, gallops, no pedal edema.   Gastrointestinal system: soft, NT, ND, no HSM felt, +bowel sounds. Central nervous system: A&O x3. no gross focal neurologic deficits, normal speech Extremities: moves all, no cyanosis, normal tone Psychiatry: normal mood, congruent affect, judgement and insight appear normal  Labs   Data Reviewed: I  have personally reviewed following labs and imaging studies  CBC: Recent Labs  Lab 01/14/20 1245  WBC 3.1*  HGB 12.8  HCT 39.2  MCV 111.7*  PLT 99991111   Basic Metabolic Panel: Recent Labs  Lab 01/14/20 1245 01/14/20 1909 01/15/20 0450  NA 137  --  137  K 4.5  --  3.2*  CL 102  --  103  CO2 23  --  28  GLUCOSE 142*  --  94  BUN 13  --  12  CREATININE 1.33*  --  1.12*  CALCIUM 8.8*  --  8.6*  MG  --  1.7  --   PHOS  --  2.5  --    GFR: Estimated Creatinine Clearance: 27.7 mL/min (A) (by C-G formula based on SCr of  1.12 mg/dL (H)). Liver Function Tests: Recent Labs  Lab 01/14/20 1245  AST 23  ALT 10  ALKPHOS 75  BILITOT 1.0  PROT 6.3*  ALBUMIN 3.5   No results for input(s): LIPASE, AMYLASE in the last 168 hours. No results for input(s): AMMONIA in the last 168 hours. Coagulation Profile: No results for input(s): INR, PROTIME in the last 168 hours. Cardiac Enzymes: No results for input(s): CKTOTAL, CKMB, CKMBINDEX, TROPONINI in the last 168 hours. BNP (last 3 results) No results for input(s): PROBNP in the last 8760 hours. HbA1C: Recent Labs    01/14/20 1909  HGBA1C 5.2   CBG: Recent Labs  Lab 01/15/20 0724  GLUCAP 89   Lipid Profile: No results for input(s): CHOL, HDL, LDLCALC, TRIG, CHOLHDL, LDLDIRECT in the last 72 hours. Thyroid Function Tests: Recent Labs    01/14/20 1909  TSH 0.853   Anemia Panel: No results for input(s): VITAMINB12, FOLATE, FERRITIN, TIBC, IRON, RETICCTPCT in the last 72 hours. Sepsis Labs: No results for input(s): PROCALCITON, LATICACIDVEN in the last 168 hours.  Recent Results (from the past 240 hour(s))  Respiratory Panel by RT PCR (Flu A&B, Covid) - Nasopharyngeal Swab     Status: None   Collection Time: 01/14/20  4:20 PM   Specimen: Nasopharyngeal Swab  Result Value Ref Range Status   SARS Coronavirus 2 by RT PCR NEGATIVE NEGATIVE Final    Comment: (NOTE) SARS-CoV-2 target nucleic acids are NOT DETECTED. The  SARS-CoV-2 RNA is generally detectable in upper respiratoy specimens during the acute phase of infection. The lowest concentration of SARS-CoV-2 viral copies this assay can detect is 131 copies/mL. A negative result does not preclude SARS-Cov-2 infection and should not be used as the sole basis for treatment or other patient management decisions. A negative result may occur with  improper specimen collection/handling, submission of specimen other than nasopharyngeal swab, presence of viral mutation(s) within the areas targeted by this assay, and inadequate number of viral copies (<131 copies/mL). A negative result must be combined with clinical observations, patient history, and epidemiological information. The expected result is Negative. Fact Sheet for Patients:  PinkCheek.be Fact Sheet for Healthcare Providers:  GravelBags.it This test is not yet ap proved or cleared by the [place] FDA and  has been authorized for detection and/or diagnosis of SARS-CoV-2 by FDA under an Emergency Use Authorization (EUA). This EUA will remain  in effect (meaning this test can be used) for the duration of the COVID-19 declaration under Section 564(b)(1) of the Act, 21 U.S.C. section 360bbb-3(b)(1), unless the authorization is terminated or revoked sooner.    Influenza A by PCR NEGATIVE NEGATIVE Final   Influenza B by PCR NEGATIVE NEGATIVE Final    Comment: (NOTE) The Xpert Xpress SARS-CoV-2/FLU/RSV assay is intended as an aid in  the diagnosis of influenza from Nasopharyngeal swab specimens and  should not be used as a sole basis for treatment. Nasal washings and  aspirates are unacceptable for Xpert Xpress SARS-CoV-2/FLU/RSV  testing. Fact Sheet for Patients: PinkCheek.be Fact Sheet for Healthcare Providers: GravelBags.it This test is not yet approved or cleared by the [place] FDA and  has been authorized for detection and/or diagnosis of SARS-CoV-2 by  FDA under an Emergency Use Authorization (EUA). This EUA will remain  in effect (meaning this test can be used) for the duration of the  Covid-19 declaration under Section 564(b)(1) of the Act, 21  U.S.C. section 360bbb-3(b)(1), unless the authorization is  terminated or revoked. Performed at  [place], [place] [address]       Imaging Studies   CT Head Wo Contrast  Result Date: 01/14/2020 CLINICAL DATA:  Altered mental status. EXAM: CT HEAD WITHOUT CONTRAST TECHNIQUE: Contiguous axial images were obtained from the base of the skull through the vertex without intravenous contrast. COMPARISON:  May 19, 2019 FINDINGS: Brain: No evidence of acute infarction, hemorrhage, hydrocephalus, extra-axial collection or mass lesion/mass effect. Moderate brain parenchymal volume loss and deep white matter microangiopathy. Again seen is left cerebellopontine angle extra-axial hyperdense 2 cm mass thought to represent a meningioma. Vascular: Intra cavernous carotid artery atherosclerotic calcifications. Skull: Normal. Negative for fracture or focal lesion. Sinuses/Orbits: No acute finding. Other: None. IMPRESSION: 1. No acute intracranial abnormality. 2. Atrophy, chronic microvascular disease. 3. Stable left cerebellopontine angle extra-axial hyperdense 2 cm mass thought to represent a meningioma. Electronically Signed   By: Fidela Salisbury M.D.   On: 01/14/2020 14:44   DG Chest Port 1 View  Result Date: 01/15/2020 CLINICAL DATA:  Nonproductive cough. EXAM: PORTABLE CHEST 1 VIEW COMPARISON:  Chest x-ray dated November 09, 2018. FINDINGS: Unchanged left chest wall pacemaker and right chest wall port catheter. The heart is at the upper limits of normal in size. Atherosclerotic calcification of the aortic arch. Normal pulmonary vascularity. The lungs remain hyperinflated with chronically  coarsened interstitial markings. New mild opacity at the right lung base. New trace left pleural effusion. No pneumothorax. No acute osseous abnormality. Old left posterior sixth and seventh rib fractures, new since the prior study. IMPRESSION: 1. Mild right basilar atelectasis versus infiltrate. 2. Trace left pleural effusion. 3. COPD. Electronically Signed   By: Titus Dubin M.D.   On: 01/15/2020 11:30     Medications   Scheduled Meds: . atorvastatin  10 mg Oral Daily  . clopidogrel  75 mg Oral Daily  . enoxaparin (LOVENOX) injection  30 mg Subcutaneous Q24H  . hydroxyurea  500 mg Oral Q M,W,F  . sodium chloride flush  3 mL Intravenous Q12H   Continuous Infusions: . lactated ringers 75 mL/hr at 01/15/20 0802       LOS: 0 days    Time spent: 45 minutes total, with > 50% spent in coordination of care, in direct contact with patient and family counseling.    Ezekiel Slocumb, DO [facility]  01/15/2020, 2:20 PM    If 7PM-7AM, please contact night-coverage. How to contact the [facility] Attending or Consulting provider [place] or covering provider during after hours [place], for this patient?    1. Check the care team in [facility] and look for a) attending/consulting [facility] provider listed and b) the [facility] team listed 2. Log into [email or web address] and use [place]'s universal password to access. If you do not have the password, please contact the hospital operator. 3. Locate the [facility] provider you are looking for under [facility] and page to a number that you can be directly reached. 4. If you still have difficulty reaching the provider, please page the [facility] (Director on Call) for the Hospitalists listed on amion for assistance.

## 2020-01-16 LAB — BASIC METABOLIC PANEL
Anion gap: 5 (ref 5–15)
BUN: 15 mg/dL (ref 8–23)
CO2: 29 mmol/L (ref 22–32)
Calcium: 8.8 mg/dL — ABNORMAL LOW (ref 8.9–10.3)
Chloride: 105 mmol/L (ref 98–111)
Creatinine, Ser: 0.93 mg/dL (ref 0.44–1.00)
GFR calc Af Amer: >60 mL/min (ref >60)
GFR calc non Af Amer: 53 mL/min — ABNORMAL LOW (ref >60)
Glucose, Bld: 90 mg/dL (ref 70–99)
Potassium: 4.1 mmol/L (ref 3.5–5.1)
Sodium: 139 mmol/L (ref 135–145)

## 2020-01-16 LAB — VITAMIN B12: Vitamin B-12: 138 pg/mL — ABNORMAL LOW (ref 180–914)

## 2020-01-16 LAB — FOLATE: Folate: 8.4 ng/mL (ref >5.9)

## 2020-01-16 LAB — MRSA PCR SCREENING: MRSA by PCR: NEGATIVE

## 2020-01-16 MED ORDER — ENOXAPARIN SODIUM 40 MG/0.4ML ~~LOC~~ SOLN
40.0000 mg | SUBCUTANEOUS | Status: DC
  Administered 2020-01-16: 20:00:00 40 mg via SUBCUTANEOUS
  Filled 2020-01-16: qty 0.4

## 2020-01-16 MED ORDER — LEVETIRACETAM 500 MG PO TABS: 500.0000 mg | ORAL_TABLET | Freq: Two times a day (BID) | ORAL | 3 refills | Status: DC

## 2020-01-16 NOTE — Progress Notes (Signed)
PROGRESS NOTE    Miche Briere   [id number]  DOB: 1923-10-14  PCP: Hortencia Pilar, MD    DOA: 01/14/2020 LOS: 1   Brief Narrative   Jaquette Regier is a 86 y.o. female with medical history of COPD, sick sinus syndrome s/p permanent pacemaker, polycythemia vera, acoustic neuroma, meningioma, chronic back pain and TIAs was brought to ED on 01/14/20 with complaint of altered mental status after son witness seizure-like activity while check in on patient.  Lives alone and independent for ADLs.  Son noted patient was less responsive than usual, and had not filled her pill box or done other daily routine activities, found her asleep slumped forward in her recliner.  She woke up and they were watching TV when son saw patient's whole body become "stiff as a board" with eyes rolled back, which lasted about a minute or so.  Following this, patient was lethargic and had garbled speech.  No history of seizures.  Son initially reported no recent illnesses, but later recalled she's been take Cipro for the past 2 months for persistent ear infection.  She had been having frequent watery diarrhea at home for some time, unsure how long.  No recent fevers, chills, or cough.  In the ED, hemodynamically stable.  Labs showed mild AKI, no leukocytosis, mildly elevated but flat troponins (patient with no chest pain), UA showed ketones, few bacteria and epi cells.  CT head with no acute intracranial abnormality, did show atrophy with chronic microvascular disease and a stable size meningioma.  Admitted to hospitalist service for further evaluation and management.      Assessment & Plan   Principal Problem:   Altered mental status, unspecified Active Problems:   Seizure-like activity (HCC)   Diarrhea   Cough   Sick sinus syndrome (HCC)   Polycythemia vera (HCC)   Peripheral vascular disease (HCC)   COPD (chronic obstructive pulmonary disease) (HCC)   Acute encephalopathy   Altered mental status -  present on admission, possibly post-ictal but etiology uncertain.  Concern for seizure activity based on history.  Improved but seemed to recur today, possibly due to Keppra vs delirium from being in hospital. --Neurology consulted, see below --Neurochecks  Seizure-like activity - see narrative above.  No history of seizures.  She has been on Cipro for 2 months which can lower seizure threshold.  Syncopal episode also on differential, but patient was reportedly post-ictal / lethargic following episode.  TSH normal.  Phos and Mg normal.  EEG showed left frontotemporal slowing.  Started on keppra 500 mg bid - patient apparently developed hallucinations and more drowsy.  Had planned to discharge home as of this AM, but later after getting [place] son reported these changes.  Keppra stopped for now by neurology to monitor for improvement. Plan to resume at lower dose tomorrow --neurology following     Diarrhea - has been on Cipro for 2 months for ear infection.  Resolved after stopping Cipro.  No need to check C diff.  Cough - unclear if present on admission, but RN reported wet-sounding cough this AM (5/10).  Chest xray showed right base small infiltrate vs atelectasis.  Suspect aspiration during seizure-like episode or when patient vomited on night of admission. --SLP consulted for swallow evaluation --monitor respiratory status --afebrile and no leukocytosis, defer antibiotics for now and monitor  Sick sinus syndrome - has PM.  Appears stable.  Telemetry.  Polycythemia vera - continue home Hydoxyurea.  Hbg normal.  Follow CBC.  Peripheral  vascular disease - continue home Plavix, Lipitor  COPD - not acutely exacerbated.  Continue home Duonebs PRN.  Patient BMI: Body mass index is 20.35 kg/m.   DVT prophylaxis: Lovenox  Diet:  Diet Orders (From admission, onward)    Start     Ordered   01/16/20 1620  DIET DYS 3 Room service appropriate? Yes with Assist; Fluid consistency: Thin  Diet  effective now    Comments: Tougher Meats Well-Cut w/ gravies added.  NO Straws.  Question Answer Comment  Room service appropriate? Yes with Assist   Fluid consistency: Thin      01/16/20 1620   01/16/20 0000  Diet - low sodium heart healthy     01/16/20 1103            Code Status: Full Code    Subjective 01/16/20    Patient seen at bedside with son present.  She reports feeling well.  Wants to go home.  Denies fever/chills.  Son said she coughed a little while eating breakfast.    Son and nurse report her hearing is much better today, and I note this as well.     Disposition Plan & Communication    Status is: Inpatient  Remains inpatient appropriate because:Ongoing diagnostic testing needed not appropriate for outpatient work up   Dispo: The patient is from: Home              Anticipated d/c is to: Home              Anticipated d/c date is: 2 days              Patient currently is not medically stable to d/c.   Family Communication: Two sons at bedside during encounter, updated in detail and questions answered.  They are in agreement with the plan as outlined.   Consults, Procedures, Significant Events   Consultants:   Neurology  Procedures:   None  Antimicrobials:   None   Objective   Vitals:   01/15/20 1612 01/15/20 2331 01/16/20 0723 01/16/20 1100  BP: (!) 108/42 (!) 155/49 114/65 (!) 119/45  Pulse: 71 76 85 82  Resp: 19 20 16 16   Temp: 98.7 F (37.1 C) 98.6 F (37 C) 98.1 F (36.7 C) 98.2 F (36.8 C)  TempSrc: Oral  Oral Oral  SpO2: 100% 98%    Weight:      Height:        Intake/Output Summary (Last 24 hours) at 01/16/2020 1826 Last data filed at 01/16/2020 1330 Gross per 24 hour  Intake 0 ml  Output --  Net 0 ml   Filed Weights   01/14/20 1243  Weight: 57.2 kg    Physical Exam:  General exam: awake, alert, no acute distress, frail HEENT: moist mucus membranes, hearing impairment is improved today Respiratory system:  decreased breath sounds, no wheezes, rales or rhonchi, normal respiratory effort. Cardiovascular system: normal S1/S2, RRR, no JVD, murmurs, rubs, gallops, no pedal edema.   Central nervous system: A&O x3. no gross focal neurologic deficits, normal speech Psychiatry: normal mood, congruent affect, judgement and insight appear normal  Labs   Data Reviewed: I have personally reviewed following labs and imaging studies  CBC: Recent Labs  Lab 01/14/20 1245  WBC 3.1*  HGB 12.8  HCT 39.2  MCV 111.7*  PLT 99991111   Basic Metabolic Panel: Recent Labs  Lab 01/14/20 1245 01/14/20 1909 01/15/20 0450 01/16/20 0317  NA 137  --  137 139  K  4.5  --  3.2* 4.1  CL 102  --  103 105  CO2 23  --  28 29  GLUCOSE 142*  --  94 90  BUN 13  --  12 15  CREATININE 1.33*  --  1.12* 0.93  CALCIUM 8.8*  --  8.6* 8.8*  MG  --  1.7  --   --   PHOS  --  2.5  --   --    GFR: Estimated Creatinine Clearance: 33.4 mL/min (by C-G formula based on SCr of 0.93 mg/dL). Liver Function Tests: Recent Labs  Lab 01/14/20 1245  AST 23  ALT 10  ALKPHOS 75  BILITOT 1.0  PROT 6.3*  ALBUMIN 3.5   No results for input(s): LIPASE, AMYLASE in the last 168 hours. No results for input(s): AMMONIA in the last 168 hours. Coagulation Profile: No results for input(s): INR, PROTIME in the last 168 hours. Cardiac Enzymes: No results for input(s): CKTOTAL, CKMB, CKMBINDEX, TROPONINI in the last 168 hours. BNP (last 3 results) No results for input(s): PROBNP in the last 8760 hours. HbA1C: Recent Labs    01/14/20 1909  HGBA1C 5.2   CBG: Recent Labs  Lab 01/15/20 0724  GLUCAP 89   Lipid Profile: No results for input(s): CHOL, HDL, LDLCALC, TRIG, CHOLHDL, LDLDIRECT in the last 72 hours. Thyroid Function Tests: Recent Labs    01/14/20 1909  TSH 0.853   Anemia Panel: Recent Labs    01/16/20 0317  VITAMINB12 138*  FOLATE 8.4   Sepsis Labs: No results for input(s): PROCALCITON, LATICACIDVEN in the last 168  hours.  Recent Results (from the past 240 hour(s))  Respiratory Panel by RT PCR (Flu A&B, Covid) - Nasopharyngeal Swab     Status: None   Collection Time: 01/14/20  4:20 PM   Specimen: Nasopharyngeal Swab  Result Value Ref Range Status   SARS Coronavirus 2 by RT PCR NEGATIVE NEGATIVE Final    Comment: (NOTE) SARS-CoV-2 target nucleic acids are NOT DETECTED. The SARS-CoV-2 RNA is generally detectable in upper respiratoy specimens during the acute phase of infection. The lowest concentration of SARS-CoV-2 viral copies this assay can detect is 131 copies/mL. A negative result does not preclude SARS-Cov-2 infection and should not be used as the sole basis for treatment or other patient management decisions. A negative result may occur with  improper specimen collection/handling, submission of specimen other than nasopharyngeal swab, presence of viral mutation(s) within the areas targeted by this assay, and inadequate number of viral copies (<131 copies/mL). A negative result must be combined with clinical observations, patient history, and epidemiological information. The expected result is Negative. Fact Sheet for Patients:  PinkCheek.be Fact Sheet for Healthcare Providers:  GravelBags.it This test is not yet ap proved or cleared by the [place] FDA and  has been authorized for detection and/or diagnosis of SARS-CoV-2 by FDA under an Emergency Use Authorization (EUA). This EUA will remain  in effect (meaning this test can be used) for the duration of the COVID-19 declaration under Section 564(b)(1) of the Act, 21 U.S.C. section 360bbb-3(b)(1), unless the authorization is terminated or revoked sooner.    Influenza A by PCR NEGATIVE NEGATIVE Final   Influenza B by PCR NEGATIVE NEGATIVE Final    Comment: (NOTE) The Xpert Xpress SARS-CoV-2/FLU/RSV assay is intended as an aid in  the diagnosis of influenza from Nasopharyngeal  swab specimens and  should not be used as a sole basis for treatment. Nasal washings and  aspirates are unacceptable  for Xpert Xpress SARS-CoV-2/FLU/RSV  testing. Fact Sheet for Patients: PinkCheek.be Fact Sheet for Healthcare Providers: GravelBags.it This test is not yet approved or cleared by the [place] FDA and  has been authorized for detection and/or diagnosis of SARS-CoV-2 by  FDA under an Emergency Use Authorization (EUA). This EUA will remain  in effect (meaning this test can be used) for the duration of the  Covid-19 declaration under Section 564(b)(1) of the Act, 21  U.S.C. section 360bbb-3(b)(1), unless the authorization is  terminated or revoked. Performed at [facility], [address] [place], [place] [address]   MRSA PCR Screening     Status: None   Collection Time: 01/16/20  9:20 AM   Specimen: Nasal Mucosa; Nasopharyngeal  Result Value Ref Range Status   MRSA by PCR NEGATIVE NEGATIVE Final    Comment:        The GeneXpert MRSA Assay (FDA approved for NASAL specimens only), is one component of a comprehensive MRSA colonization surveillance program. It is not intended to diagnose MRSA infection nor to guide or monitor treatment for MRSA infections. Performed at [facility], [address], [place], [place] [address]       Imaging Studies   EEG  Result Date: 01/15/2020 [name], MD     01/15/2020  3:19 PM ELECTROENCEPHALOGRAM REPORT Patient: Arrilla Armato       Room #: 109A-AA EEG No. ID: [id number] Age: 86 y.o.        Sex: female Requesting Physician: [place] Ped Report Date:  01/15/2020       Interpreting Physician: [name] History: Sundas Charnley is an 86 y.o. female with seizure like activity Medications: Plavix, Hydroxyurea, Lipitor Conditions of Recording:  This is a 21 channel routine scalp EEG performed with bipolar and monopolar montages arranged in  accordance to the international 10/20 system of electrode placement. One channel was dedicated to EKG recording. The patient is in the awake state. Description:  The waking background activity consists of a low voltage, poorly sustained 8 Hz alpha activity, seen from the parieto-occipital and posterior temporal regions.  Low voltage fast activity, poorly organized, is seen anteriorly and is at times superimposed on more posterior regions.  A mixture of theta and alpha rhythms are seen from the central and temporal regions.  There  Are noted intermittent periods of slowing that are ,most prominent over the left hemisphere and most specifically the left fronto-temporal region.  No epileptiform activity is noted.  The patient does not drowse or sleep. Hyperventilation was not performed.  Intermittent photic stimulation was performed but failed to illicit any change in the tracing.  IMPRESSION: This is an abnormal electroencephalogram secondary to intermittent slowing noted most notably in the left fronto-temporal region.  This finding is suggestive of a focal disturbance that is etiologically nonspecific, but may include stroke or mass lesion in that region.  No epileptiform activity is noted.  [name], MD Neurology [phone] 01/15/2020, 3:14 PM   DG Chest Port 1 View  Result Date: 01/15/2020 CLINICAL DATA:  Nonproductive cough. EXAM: PORTABLE CHEST 1 VIEW COMPARISON:  Chest x-ray dated November 09, 2018. FINDINGS: Unchanged left chest wall pacemaker and right chest wall port catheter. The heart is at the upper limits of normal in size. Atherosclerotic calcification of the aortic arch. Normal pulmonary vascularity. The lungs remain hyperinflated with chronically coarsened interstitial markings. New mild opacity at the right lung base. New trace left pleural effusion. No pneumothorax. No acute osseous abnormality. Old  left posterior sixth and seventh rib fractures, new since the prior study. IMPRESSION: 1.  Mild right basilar atelectasis versus infiltrate. 2. Trace left pleural effusion. 3. COPD. Electronically Signed   By: Titus Dubin M.D.   On: 01/15/2020 11:30     Medications   Scheduled Meds: . atorvastatin  10 mg Oral Daily  . clopidogrel  75 mg Oral Daily  . enoxaparin (LOVENOX) injection  40 mg Subcutaneous Q24H  . hydroxyurea  500 mg Oral Q M,W,F  . sodium chloride flush  3 mL Intravenous Q12H   Continuous Infusions: . lactated ringers 75 mL/hr at 01/15/20 2250       LOS: 1 day    Time spent: 34 minutes total with > 50% spent in coordination of care and direct patient and family contact.    Ezekiel Slocumb, DO [facility]  01/16/2020, 6:26 PM    If 7PM-7AM, please contact night-coverage. How to contact the [facility] Attending or Consulting provider [place] or covering provider during after hours [place], for this patient?    1. Check the care team in [facility] and look for a) attending/consulting [facility] provider listed and b) the [facility] team listed 2. Log into [email or web address] and use [place] universal password to access. If you do not have the password, please contact the hospital operator. 3. Locate the [facility] provider you are looking for under [facility] and page to a number that you can be directly reached. 4. If you still have difficulty reaching the provider, please page the [facility] (Director on Call) for the Hospitalists listed on amion for assistance.

## 2020-01-16 NOTE — Progress Notes (Signed)
PHARMACIST - PHYSICIAN COMMUNICATION  CONCERNING:  Enoxaparin (Lovenox) for DVT Prophylaxis    RECOMMENDATION: Patient was prescribed enoxaprin 30mg  q24 hours for VTE prophylaxis.   Filed Weights   01/14/20 1243  Weight: 57.2 kg (126 lb 1.7 oz)    Body mass index is 20.35 kg/m.  Estimated Creatinine Clearance: 33.4 mL/min (by C-G formula based on SCr of 0.93 mg/dL).  Patient is candidate for enoxaparin 40mg  every 24 hours based on CrCl >84ml/min AND Weight > 45kg for women.  DESCRIPTION: Pharmacy has adjusted enoxaparin dose per [facility] policy.  Patient is now receiving enoxaparin 40mg  every 24 hours.   Lu Duffel, PharmD Clinical Pharmacist  01/16/2020 9:20 AM

## 2020-01-16 NOTE — TOC Progression Note (Signed)
Transition of Care [facility]) - Progression Note    Patient Details  Name: Krista Ortiz MRN: [id number] Date of Birth: 1924-07-20  [facility] CM/SW Contact  Shelbie Hutching, RN Phone Number: 01/16/2020, 3:59 PM  Clinical Narrative:    Discharge canceled, EMS pickup canceled, family and neurology are concerned about delirium.    Expected Discharge Plan: [place] Barriers to Discharge: Barriers Resolved  Expected Discharge Plan and Services Expected Discharge Plan: [place]   Discharge Planning Services: CM Consult Post Acute Care Choice: [place] arrangements for the past 2 months: Apartment Expected Discharge Date: 01/16/20                         HH Arranged: PT, OT, Social Work [employer] Agency: [place] Date HH Agency Contacted: 01/16/20 Time [place]: 1109 Representative spoke with at [place]: [place] ([place]) Interventions    Readmission Risk Interventions No flowsheet data found.

## 2020-01-16 NOTE — Progress Notes (Signed)
Subjective: Per son patient more sleepy and hallucinating today.    Objective: Current vital signs: BP 114/65 (BP Location: Right Arm)   Pulse 85   Temp 98.1 F (36.7 C) (Oral)   Resp 16   Ht 5\' 6"  (1.676 m)   Wt 57.2 kg   LMP  (LMP Unknown)   SpO2 98%   BMI 20.35 kg/m  Vital signs in last 24 hours: Temp:  [98.1 F (36.7 C)-98.7 F (37.1 C)] 98.1 F (36.7 C) (05/11 0723) Pulse Rate:  [71-85] 85 (05/11 0723) Resp:  [16-20] 16 (05/11 0723) BP: (108-155)/(42-65) 114/65 (05/11 0723) SpO2:  [98 %-100 %] 98 % (05/10 2331)  Intake/Output from previous day: 05/10 0701 - 05/11 0700 In: 120 [P.O.:120] Out: -  Intake/Output this shift: No intake/output data recorded. Nutritional status:  Diet Order            Diet - low sodium heart healthy        Diet Heart Room service appropriate? Yes; Fluid consistency: Thin  Diet effective now              Neurologic Exam: Mental Status: Lethargic but easily alerted.  Speech fluent without evidence of aphasia.  Follows simple commands.  Expresses wish to go home Cranial Nerves: II: Visual fields grossly normal III,IV, VI: ptosis not present, extra-ocular motions intact bilaterally V,VII: smile symmetric, facial light touch sensation normal bilaterally VIII: hearing normal bilaterally IX,X: gag reflex present XI: bilateral shoulder shrug XII: midline tongue extension Motor: Moves all extremities against gravity. Sensory: Pinprick and light touch intact throughout, bilaterally  Lab Results: Basic Metabolic Panel: Recent Labs  Lab 01/14/20 1245 01/14/20 1909 01/15/20 0450 01/16/20 0317  NA 137  --  137 139  K 4.5  --  3.2* 4.1  CL 102  --  103 105  CO2 23  --  28 29  GLUCOSE 142*  --  94 90  BUN 13  --  12 15  CREATININE 1.33*  --  1.12* 0.93  CALCIUM 8.8*  --  8.6* 8.8*  MG  --  1.7  --   --   PHOS  --  2.5  --   --     Liver Function Tests: Recent Labs  Lab 01/14/20 1245  AST 23  ALT 10  ALKPHOS 75   BILITOT 1.0  PROT 6.3*  ALBUMIN 3.5   No results for input(s): LIPASE, AMYLASE in the last 168 hours. No results for input(s): AMMONIA in the last 168 hours.  CBC: Recent Labs  Lab 01/14/20 1245  WBC 3.1*  HGB 12.8  HCT 39.2  MCV 111.7*  PLT 365    Cardiac Enzymes: No results for input(s): CKTOTAL, CKMB, CKMBINDEX, TROPONINI in the last 168 hours.  Lipid Panel: No results for input(s): CHOL, TRIG, HDL, CHOLHDL, VLDL, LDLCALC in the last 168 hours.  CBG: Recent Labs  Lab 01/15/20 [id number]    Microbiology: Results for orders placed or performed during the hospital encounter of 01/14/20  Respiratory Panel by RT PCR (Flu A&B, Covid) - Nasopharyngeal Swab     Status: None   Collection Time: 01/14/20  4:20 PM   Specimen: Nasopharyngeal Swab  Result Value Ref Range Status   SARS Coronavirus 2 by RT PCR NEGATIVE NEGATIVE Final    Comment: (NOTE) SARS-CoV-2 target nucleic acids are NOT DETECTED. The SARS-CoV-2 RNA is generally detectable in upper respiratoy specimens during the acute phase of infection. The lowest concentration of SARS-CoV-2 viral  copies this assay can detect is 131 copies/mL. A negative result does not preclude SARS-Cov-2 infection and should not be used as the sole basis for treatment or other patient management decisions. A negative result may occur with  improper specimen collection/handling, submission of specimen other than nasopharyngeal swab, presence of viral mutation(s) within the areas targeted by this assay, and inadequate number of viral copies (<131 copies/mL). A negative result must be combined with clinical observations, patient history, and epidemiological information. The expected result is Negative. Fact Sheet for Patients:  PinkCheek.be Fact Sheet for Healthcare Providers:  GravelBags.it This test is not yet ap proved or cleared by the [place] FDA and  has been  authorized for detection and/or diagnosis of SARS-CoV-2 by FDA under an Emergency Use Authorization (EUA). This EUA will remain  in effect (meaning this test can be used) for the duration of the COVID-19 declaration under Section 564(b)(1) of the Act, 21 U.S.C. section 360bbb-3(b)(1), unless the authorization is terminated or revoked sooner.    Influenza A by PCR NEGATIVE NEGATIVE Final   Influenza B by PCR NEGATIVE NEGATIVE Final    Comment: (NOTE) The Xpert Xpress SARS-CoV-2/FLU/RSV assay is intended as an aid in  the diagnosis of influenza from Nasopharyngeal swab specimens and  should not be used as a sole basis for treatment. Nasal washings and  aspirates are unacceptable for Xpert Xpress SARS-CoV-2/FLU/RSV  testing. Fact Sheet for Patients: PinkCheek.be Fact Sheet for Healthcare Providers: GravelBags.it This test is not yet approved or cleared by the [place] FDA and  has been authorized for detection and/or diagnosis of SARS-CoV-2 by  FDA under an Emergency Use Authorization (EUA). This EUA will remain  in effect (meaning this test can be used) for the duration of the  Covid-19 declaration under Section 564(b)(1) of the Act, 21  U.S.C. section 360bbb-3(b)(1), unless the authorization is  terminated or revoked. Performed at [facility], [address] [place], [place] [address]   MRSA PCR Screening     Status: None   Collection Time: 01/16/20  9:20 AM   Specimen: Nasal Mucosa; Nasopharyngeal  Result Value Ref Range Status   MRSA by PCR NEGATIVE NEGATIVE Final    Comment:        The GeneXpert MRSA Assay (FDA approved for NASAL specimens only), is one component of a comprehensive MRSA colonization surveillance program. It is not intended to diagnose MRSA infection nor to guide or monitor treatment for MRSA infections. Performed at [facility], [address] [place], [place]  [address]     Coagulation Studies: No results for input(s): LABPROT, INR in the last 72 hours.  Imaging: EEG  Result Date: 01/15/2020 [name], MD     01/15/2020  3:19 PM ELECTROENCEPHALOGRAM REPORT Patient: Krista Ortiz       Room #: 109A-AA EEG No. ID: [id number] Age: 86 y.o.        Sex: female Requesting Physician: [place] Ped Report Date:  01/15/2020       Interpreting Physician: [name] History: Krista Ortiz is an 86 y.o. female with seizure like activity Medications: Plavix, Hydroxyurea, Lipitor Conditions of Recording:  This is a 21 channel routine scalp EEG performed with bipolar and monopolar montages arranged in accordance to the international 10/20 system of electrode placement. One channel was dedicated to EKG recording. The patient is in the awake state. Description:  The waking background activity consists of a low voltage, poorly sustained 8 Hz alpha activity, seen from the  parieto-occipital and posterior temporal regions.  Low voltage fast activity, poorly organized, is seen anteriorly and is at times superimposed on more posterior regions.  A mixture of theta and alpha rhythms are seen from the central and temporal regions.  There  Are noted intermittent periods of slowing that are ,most prominent over the left hemisphere and most specifically the left fronto-temporal region.  No epileptiform activity is noted.  The patient does not drowse or sleep. Hyperventilation was not performed.  Intermittent photic stimulation was performed but failed to illicit any change in the tracing.  IMPRESSION: This is an abnormal electroencephalogram secondary to intermittent slowing noted most notably in the left fronto-temporal region.  This finding is suggestive of a focal disturbance that is etiologically nonspecific, but may include stroke or mass lesion in that region.  No epileptiform activity is noted.  [name], MD Neurology [phone] 01/15/2020, 3:14 PM   DG Chest Port  1 View  Result Date: 01/15/2020 CLINICAL DATA:  Nonproductive cough. EXAM: PORTABLE CHEST 1 VIEW COMPARISON:  Chest x-ray dated November 09, 2018. FINDINGS: Unchanged left chest wall pacemaker and right chest wall port catheter. The heart is at the upper limits of normal in size. Atherosclerotic calcification of the aortic arch. Normal pulmonary vascularity. The lungs remain hyperinflated with chronically coarsened interstitial markings. New mild opacity at the right lung base. New trace left pleural effusion. No pneumothorax. No acute osseous abnormality. Old left posterior sixth and seventh rib fractures, new since the prior study. IMPRESSION: 1. Mild right basilar atelectasis versus infiltrate. 2. Trace left pleural effusion. 3. COPD. Electronically Signed   By: Titus Dubin M.D.   On: 01/15/2020 11:30    Medications:  I have reviewed the patient's current medications. Scheduled: . atorvastatin  10 mg Oral Daily  . clopidogrel  75 mg Oral Daily  . enoxaparin (LOVENOX) injection  40 mg Subcutaneous Q24H  . hydroxyurea  500 mg Oral Q M,W,F  . sodium chloride flush  3 mL Intravenous Q12H    Assessment/Plan: 86 year old female with medical history significant ofCOPD,sick sinus syndrome s/ppermanent pacemaker, Acousticneuroma,meningioma,chronic back pain and TIAs who presents with altered mental status and seizure like activity.  Head CT personally reviewed and shows no evidence of acute changes.  MRI unable to be performed due to the presence of a pacemaker therefore can not rule out the possibility of a small embolic event that can not be visualized on CT or TIA.  Larger event lot likely since patient with nonfocal neurological examination.  Seizure is on the differential as well.  Lab work is unremarkable including serum magnesium, serum phosphorus and TSH.  Etiology undetermined but EEG showed left fronto-temporal slowing.  Concern remained for seizure.  Patient placed on Keppra.  Today  patient lethargic and hallucinating.  Unclear if this represents a hospital delirium versus Keppra side effects.    Recommendations: 1. Continue seizure precautions 2. D/C Keppra 3. Will re-evaluate in AM and determine need for an alternative anticonvulsant.  If improved in AM symptoms likely related to [place].     LOS: 1 day   [name], MD Neurology [phone] 01/16/2020  3:48 PM

## 2020-01-16 NOTE — TOC Progression Note (Signed)
Transition of Care [facility]) - Progression Note    Patient Details  Name: Krista Ortiz MRN: [id number] Date of Birth: 1924/04/20  [facility]) CM/SW Contact  Shelbie Hutching, RN Phone Number: 01/16/2020, 3:34 PM  Clinical Narrative:    Patient's son, Gwyndolyn Saxon, requests EMS transport home for patient.  [facility] has been arranged for transport home.  Patient is 3rd on the list for pick up.    Expected Discharge Plan: [place] Barriers to Discharge: Barriers Resolved  Expected Discharge Plan and Services Expected Discharge Plan: [place]   Discharge Planning Services: CM Consult Post Acute Care Choice: [place] arrangements for the past 2 months: Apartment Expected Discharge Date: 01/16/20                         HH Arranged: PT, OT, Social Work [employer] Agency: [place] Date HH Agency Contacted: 01/16/20 Time [place]: 1109 Representative spoke with at [place]: [place] ([place]) Interventions    Readmission Risk Interventions No flowsheet data found.

## 2020-01-16 NOTE — Evaluation (Addendum)
Clinical/Bedside Swallow Evaluation Patient Details  Name: Krista Ortiz MRN: [id number] Date of Birth: 02-Sep-1924  Today's Date: 01/16/2020 Time: SLP Start Time (ACUTE ONLY): 1200 SLP Stop Time (ACUTE ONLY): 1300 SLP Time Calculation (min) (ACUTE ONLY): 60 min  Past Medical History:  Past Medical History:  Diagnosis Date  . Acoustic neuroma [facility])    md notes says history of acoustic neuroma or meningioma followed up by yearly MRI  . Chronic back pain   . COPD (chronic obstructive pulmonary disease) ([place])   . Hyperlipidemia   . Knee joint replacement status, right 11/25/2016  . Polycythemia   . Polycythemia, secondary 04/26/2015  . Port-A-Cath in place   . Presence of permanent cardiac pacemaker   . PVD (peripheral vascular disease) ([place])   . Right wrist fracture 11/25/2016  . Thoracic compression fracture, closed, initial encounter ([place]) 11/25/2016  . TIA (transient ischemic attack)    Past Surgical History:  Past Surgical History:  Procedure Laterality Date  . ABDOMINAL HYSTERECTOMY    . APPENDECTOMY    . BACK SURGERY    . cataracts    . CHOLECYSTECTOMY    . INTRAMEDULLARY (IM) NAIL INTERTROCHANTERIC Left 11/10/2018   Procedure: INTRAMEDULLARY (IM) NAIL INTERTROCHANTRIC;  Surgeon: Corky Mull, MD;  Location: [facility] ORS;  Service: Orthopedics;  Laterality: Left;  . PACEMAKER INSERTION Left 02/18/2015   Procedure: INSERTION PACEMAKER;  Surgeon: Isaias Cowman, MD;  Location: [facility] ORS;  Service: Cardiovascular;  Laterality: Left;  . REPLACEMENT TOTAL KNEE    . TONSILLECTOMY     HPI:  Pt is a 86 y.o. female with medical history significant of COPD, sick sinus syndrome s/p permanent pacemaker, Acoustic neuroma, meningioma, chronic back pain and TIAs was brought to ED with complaint of altered mental status.  CT head negative for any acute abnormalities.  CXR: Mild right basilar atelectasis versus infiltrate.  Pt has a Baseline h/o Esophageal phase dysmotility -- see DG  Esophagus report 09/2017. ANY Esophageal dysmotility can increase risk for pharyngeal phase dysphagia thus pulmonary impact.    Assessment / Plan / Recommendation Clinical Impression  Pt appears to present w/ grossly adequate oropharyngeal phase swallowing function w/ po intake of SINGLE trials of soft foods and thin liquids via Cup following general aspiration precautions. However, pt has documented Esophageal phase dysmotility per chart notes (see DG Esophagus in 2019 indicating dysmotility including Barium Tablet "hanging" in Cervical Esophagus), and Advanced Age. No laryngeal penetration or aspiration was noted during that study. Currently, pt c/o similar issues of difficulty swallowing pointing to her sternal notch area. She endorsed the difficulty occurs "mostly" when she tries to eat meats and breads especially, or "too much at a time". ANY Esophgael phase dysmotility can increase pharyngeal phase swallowing issues thus increase risk for aspiration to occur which in turn could increase risk for Pulmonary decline. During SINGLE sips and bites, pt exhibited adequate oropharyngeal phase swallowing w/ no immediate, overt clinical s/s of aspiration noted; no wet vocal quality or decline in respiratory status. Oral phase was adequte for bolus management and timely A-P transfer w/ all trials though min extra time noted for full mastication of soft solid meat(meatloaf). Educated pt on taking SINGLE sips of thin liquids via CUP and taking TIME b/t bites of foods to give time for Esophageal clearing -- encourage pt to alternate food and liquids. Son stated pt often ate "too quickly" and "took large bites". Pt was noted to cough when she attempted to drink thin liquids w/ mashed potatoes  in her mouth. Again, educated on only food OR liquid in the mouth at one time, NOT both. OM exam revealed no unilateral weakness. Pt fed self w/ tray setup and positioning upright.  Recommend pt continue a more mech soft consistency  diet w/ thin liquids via CUP; No Straws. Less tough, bulky foods in diet -- more broken down and softened foods w/ gravies added. Recommended Smaller, mini meals and snacks vs larger meals at one time. Recommend general aspiration precautions. Recommend Pills in Puree - Crushed vs Whole if small. Son stated pt had a tendency to "throw her head back to swallow pills". Recommend monitoring of pt during meals initially to aid in follow through w/ precuations -- especially Esophageal phase motility precautions. Discussed the above w/ Son; gave handouts on precautions. MD/NSG updated. Recommend Dietician f/u for support; Palliative Care consult for further discussion. Pt d/t discharge Home w/ family after lunch meal per NSG, MD. SLP Visit Diagnosis: Dysphagia, pharyngoesophageal phase (R13.14)(Esophageal phase dysmotility)    Aspiration Risk  Mild aspiration risk;Risk for inadequate nutrition/hydration(reduced following precautions)    Diet Recommendation  Mech Soft consistency diet w/ any meats well-cut; moistened foods; Thin liquids VIA CUP ONLY. General aspiration precautions; Esophageal dysmotility and Reflux precautions. Support and monitoring at meals as needed for follow through w/ precautions(monitoring by family members when at home)  Medication Administration: Whole meds with puree(vs Crushed in larger pills)    Other  Recommendations Recommended Consults: Consider GI evaluation(discussion of Esophageal phase dysmotility; Dietician) Oral Care Recommendations: Oral care BID;Oral care before and after PO;Patient independent with oral care Other Recommendations: (n/a)   Follow up Recommendations None      Frequency and Duration (n/a)  (n/a)       Prognosis Prognosis for Safe Diet Advancement: Fair Barriers to Reach Goals: Time post onset;Severity of deficits(GI)      Swallow Study   General Date of Onset: 01/14/20 HPI: Pt is a 86 y.o. female with medical history significant of COPD,  sick sinus syndrome s/p permanent pacemaker, Acoustic neuroma, meningioma, chronic back pain and TIAs was brought to ED with complaint of altered mental status.  CT head negative for any acute abnormalities.  CXR: Mild right basilar atelectasis versus infiltrate.  Pt has a Baseline h/o Esophageal phase dysmotility -- see DG Esophagus report 09/2017. ANY Esophageal dysmotility can increase risk for pharyngeal phase dysphagia thus pulmonary impact.  Type of Study: Bedside Swallow Evaluation Previous Swallow Assessment: none Diet Prior to this Study: Regular;Thin liquids Temperature Spikes Noted: No(wbc 3.1) Respiratory Status: Room air History of Recent Intubation: No Behavior/Cognition: Alert;Cooperative;Pleasant mood;Distractible;Requires cueing(HOH) Oral Cavity Assessment: Within Functional Limits Oral Care Completed by SLP: Recent completion by staff Oral Cavity - Dentition: Dentures, top;Dentures, bottom Vision: Functional for self-feeding Self-Feeding Abilities: Able to feed self;Needs set up Patient Positioning: Upright in bed(needed positioning) Baseline Vocal Quality: Normal Volitional Cough: Strong;Congested Volitional Swallow: Able to elicit    Oral/Motor/Sensory Function Overall Oral Motor/Sensory Function: Within functional limits   Ice Chips Ice chips: Within functional limits Presentation: Spoon(fed; 2 trials)   Thin Liquid Thin Liquid: Within functional limits Presentation: Cup;Self Fed(8 trials; then other sips during lunch meal) Other Comments: she often drank sips w/ food in mouth still -- educated pt on clearing mouth fully b/f taking sips of liquids    Nectar Thick Nectar Thick Liquid: Not tested   Honey Thick Honey Thick Liquid: Not tested   Puree Puree: Within functional limits Presentation: Self Fed;Spoon(~4+ ozs)   Solid  Solid: Impaired Presentation: Spoon;Self Fed(8 trials) Oral Phase Impairments: Impaired mastication(min) Oral Phase Functional  Implications: Impaired mastication(min) Pharyngeal Phase Impairments: (none)       Orinda Kenner, MS, CCC-SLP Jaid Quirion 01/16/2020,4:10 PM

## 2020-01-16 NOTE — TOC Transition Note (Signed)
Transition of Care [facility]) - CM/SW Discharge Note   Patient Details  Name: Derotha Fegley MRN: [id number] Date of Birth: Jul 22, 1924  [facility]) CM/SW Contact:  Shelbie Hutching, RN Phone Number: 01/16/2020, 11:10 AM   Clinical Narrative:    Patient will be discharged today after speech evaluation.  Patient's son Gwyndolyn Saxon is at the bedside and will transport patient home, he will also stay with her for a couple of days or longer if needed to help out.  Sharmon Revere with [facility] has accepted home health referral for PT, OT, and SW.  Patient has no equipment needs.   Final next level of care: Home w Home Health Services Barriers to Discharge: Barriers Resolved   Patient Goals and CMS Choice   CMS Medicare.gov Compare Post Acute Care list provided to:: Patient Represenative (must comment) Choice offered to / list presented to : Adult Children(son Gwyndolyn Saxon)  Discharge Placement                       Discharge Plan and Services   Discharge Planning Services: CM Consult Post Acute Care Choice: Home Health                    HH Arranged: PT, OT, Social Work [facility] Agency: [place] Date [place]: 01/16/20 Time [place]: 1109 Representative spoke with at [place]: [place] ([place]) Interventions     Readmission Risk Interventions No flowsheet data found.

## 2020-01-17 DIAGNOSIS — D45 Polycythemia vera: Secondary | ICD-10-CM

## 2020-01-17 DIAGNOSIS — R05 Cough: Secondary | ICD-10-CM

## 2020-01-17 DIAGNOSIS — J42 Unspecified chronic bronchitis: Secondary | ICD-10-CM

## 2020-01-17 DIAGNOSIS — I739 Peripheral vascular disease, unspecified: Secondary | ICD-10-CM

## 2020-01-17 LAB — CBC WITH DIFFERENTIAL/PLATELET
Abs Immature Granulocytes: 0.03 10*3/uL (ref 0.00–0.07)
Basophils Absolute: 0 10*3/uL (ref 0.0–0.1)
Basophils Relative: 1 %
Eosinophils Absolute: 0 10*3/uL (ref 0.0–0.5)
Eosinophils Relative: 1 %
HCT: 34.3 % — ABNORMAL LOW (ref 36.0–46.0)
Hemoglobin: 11.7 g/dL — ABNORMAL LOW (ref 12.0–15.0)
Immature Granulocytes: 1 %
Lymphocytes Relative: 21 %
Lymphs Abs: 0.7 10*3/uL (ref 0.7–4.0)
MCH: 36.4 pg — ABNORMAL HIGH (ref 26.0–34.0)
MCHC: 34.1 g/dL (ref 30.0–36.0)
MCV: 106.9 fL — ABNORMAL HIGH (ref 80.0–100.0)
Monocytes Absolute: 0.4 10*3/uL (ref 0.1–1.0)
Monocytes Relative: 11 %
Neutro Abs: 2 10*3/uL (ref 1.7–7.7)
Neutrophils Relative %: 65 %
Platelets: 262 10*3/uL (ref 150–400)
RBC: 3.21 MIL/uL — ABNORMAL LOW (ref 3.87–5.11)
RDW: 13.5 % (ref 11.5–15.5)
WBC: 3.1 10*3/uL — ABNORMAL LOW (ref 4.0–10.5)
nRBC: 0 % (ref 0.0–0.2)

## 2020-01-17 LAB — GLUCOSE, CAPILLARY: Glucose-Capillary: 90 mg/dL (ref 70–99)

## 2020-01-17 NOTE — Progress Notes (Signed)
Subjective: Patient resting this morning on my entering the room but easily awakened and seems intact.  No seizure like activity overnight.    Objective: Current vital signs: BP (!) 136/56 (BP Location: Left Arm)   Pulse 83   Temp 98.6 F (37 C)   Resp 18   Ht 5\' 6"  (1.676 m)   Wt 57.2 kg   LMP  (LMP Unknown)   SpO2 97%   BMI 20.35 kg/m  Vital signs in last 24 hours: Temp:  [98.2 F (36.8 C)-98.6 F (37 C)] 98.6 F (37 C) (05/12 0802) Pulse Rate:  [80-83] 83 (05/12 0802) Resp:  [16-18] 18 (05/12 0802) BP: (119-162)/(45-71) 136/56 (05/12 0802) SpO2:  [96 %-97 %] 97 % (05/12 0802)  Intake/Output from previous day: 05/11 0701 - 05/12 0700 In: 120 [P.O.:120] Out: -  Intake/Output this shift: No intake/output data recorded. Nutritional status:  Diet Order            DIET DYS 3 Room service appropriate? Yes with Assist; Fluid consistency: Thin  Diet effective now        Diet - low sodium heart healthy              Neurologic Exam: Mental Status: Alert, oriented to name and place.  Speech fluent without evidence of aphasia.  Follows commands Cranial Nerves: II: Visual fields grossly normal III,IV, VI: ptosis not present, extra-ocular motions intact bilaterally V,VII: smile symmetric, facial light touch sensation normal bilaterally VIII: hearing normal bilaterally IX,X: gag reflex present XI: bilateral shoulder shrug XII: midline tongue extension Motor: Moves all extremities against gravity   Lab Results: Basic Metabolic Panel: Recent Labs  Lab 01/14/20 1245 01/14/20 1909 01/15/20 0450 01/16/20 0317  NA 137  --  137 139  K 4.5  --  3.2* 4.1  CL 102  --  103 105  CO2 23  --  28 29  GLUCOSE 142*  --  94 90  BUN 13  --  12 15  CREATININE 1.33*  --  1.12* 0.93  CALCIUM 8.8*  --  8.6* 8.8*  MG  --  1.7  --   --   PHOS  --  2.5  --   --     Liver Function Tests: Recent Labs  Lab 01/14/20 1245  AST 23  ALT 10  ALKPHOS 75  BILITOT 1.0  PROT 6.3*   ALBUMIN 3.5   No results for input(s): LIPASE, AMYLASE in the last 168 hours. No results for input(s): AMMONIA in the last 168 hours.  CBC: Recent Labs  Lab 01/14/20 1245 01/17/20 0419  WBC 3.1* 3.1*  NEUTROABS  --  2.0  HGB 12.8 11.7*  HCT 39.2 34.3*  MCV 111.7* 106.9*  PLT 365 262    Cardiac Enzymes: No results for input(s): CKTOTAL, CKMB, CKMBINDEX, TROPONINI in the last 168 hours.  Lipid Panel: No results for input(s): CHOL, TRIG, HDL, CHOLHDL, VLDL, LDLCALC in the last 168 hours.  CBG: Recent Labs  Lab 01/15/20 0724 01/17/20 0804  GLUCAP 89 90    Microbiology: Results for orders placed or performed during the hospital encounter of 01/14/20  Respiratory Panel by RT PCR (Flu A&B, Covid) - Nasopharyngeal Swab     Status: None   Collection Time: 01/14/20  4:20 PM   Specimen: Nasopharyngeal Swab  Result Value Ref Range Status   SARS Coronavirus 2 by RT PCR NEGATIVE NEGATIVE Final    Comment: (NOTE) SARS-CoV-2 target nucleic acids are NOT DETECTED. The SARS-CoV-2  RNA is generally detectable in upper respiratoy specimens during the acute phase of infection. The lowest concentration of SARS-CoV-2 viral copies this assay can detect is 131 copies/mL. A negative result does not preclude SARS-Cov-2 infection and should not be used as the sole basis for treatment or other patient management decisions. A negative result may occur with  improper specimen collection/handling, submission of specimen other than nasopharyngeal swab, presence of viral mutation(s) within the areas targeted by this assay, and inadequate number of viral copies (<131 copies/mL). A negative result must be combined with clinical observations, patient history, and epidemiological information. The expected result is Negative. Fact Sheet for Patients:  PinkCheek.be Fact Sheet for Healthcare Providers:  GravelBags.it This test is not yet ap  proved or cleared by the [place] FDA and  has been authorized for detection and/or diagnosis of SARS-CoV-2 by FDA under an Emergency Use Authorization (EUA). This EUA will remain  in effect (meaning this test can be used) for the duration of the COVID-19 declaration under Section 564(b)(1) of the Act, 21 U.S.C. section 360bbb-3(b)(1), unless the authorization is terminated or revoked sooner.    Influenza A by PCR NEGATIVE NEGATIVE Final   Influenza B by PCR NEGATIVE NEGATIVE Final    Comment: (NOTE) The Xpert Xpress SARS-CoV-2/FLU/RSV assay is intended as an aid in  the diagnosis of influenza from Nasopharyngeal swab specimens and  should not be used as a sole basis for treatment. Nasal washings and  aspirates are unacceptable for Xpert Xpress SARS-CoV-2/FLU/RSV  testing. Fact Sheet for Patients: PinkCheek.be Fact Sheet for Healthcare Providers: GravelBags.it This test is not yet approved or cleared by the [place] FDA and  has been authorized for detection and/or diagnosis of SARS-CoV-2 by  FDA under an Emergency Use Authorization (EUA). This EUA will remain  in effect (meaning this test can be used) for the duration of the  Covid-19 declaration under Section 564(b)(1) of the Act, 21  U.S.C. section 360bbb-3(b)(1), unless the authorization is  terminated or revoked. Performed at [facility], [address] [place], [place] [address]   MRSA PCR Screening     Status: None   Collection Time: 01/16/20  9:20 AM   Specimen: Nasal Mucosa; Nasopharyngeal  Result Value Ref Range Status   MRSA by PCR NEGATIVE NEGATIVE Final    Comment:        The GeneXpert MRSA Assay (FDA approved for NASAL specimens only), is one component of a comprehensive MRSA colonization surveillance program. It is not intended to diagnose MRSA infection nor to guide or monitor treatment for MRSA infections. Performed at [facility], [address] [place], [place] [address]     Coagulation Studies: No results for input(s): LABPROT, INR in the last 72 hours.  Imaging: EEG  Result Date: 01/15/2020 [name], MD     01/15/2020  3:19 PM ELECTROENCEPHALOGRAM REPORT Patient: Krista Ortiz       Room #: 109A-AA EEG No. ID: [id number] Age: 86 y.o.        Sex: female Requesting Physician: Arbutus Ped Report Date:  01/15/2020       Interpreting Physician: [name] History: Delphia Anhalt is an 86 y.o. female with seizure like activity Medications: Plavix, Hydroxyurea, Lipitor Conditions of Recording:  This is a 21 channel routine scalp EEG performed with bipolar and monopolar montages arranged in accordance to the international 10/20 system of electrode placement. One channel was dedicated to EKG recording. The patient is in the awake state.  Description:  The waking background activity consists of a low voltage, poorly sustained 8 Hz alpha activity, seen from the parieto-occipital and posterior temporal regions.  Low voltage fast activity, poorly organized, is seen anteriorly and is at times superimposed on more posterior regions.  A mixture of theta and alpha rhythms are seen from the central and temporal regions.  There  Are noted intermittent periods of slowing that are ,most prominent over the left hemisphere and most specifically the left fronto-temporal region.  No epileptiform activity is noted.  The patient does not drowse or sleep. Hyperventilation was not performed.  Intermittent photic stimulation was performed but failed to illicit any change in the tracing.  IMPRESSION: This is an abnormal electroencephalogram secondary to intermittent slowing noted most notably in the left fronto-temporal region.  This finding is suggestive of a focal disturbance that is etiologically nonspecific, but may include stroke or mass lesion in that region.  No epileptiform activity is noted.  [name], MD  Neurology [phone] 01/15/2020, 3:14 PM   DG Chest Port 1 View  Result Date: 01/15/2020 CLINICAL DATA:  Nonproductive cough. EXAM: PORTABLE CHEST 1 VIEW COMPARISON:  Chest x-ray dated November 09, 2018. FINDINGS: Unchanged left chest wall pacemaker and right chest wall port catheter. The heart is at the upper limits of normal in size. Atherosclerotic calcification of the aortic arch. Normal pulmonary vascularity. The lungs remain hyperinflated with chronically coarsened interstitial markings. New mild opacity at the right lung base. New trace left pleural effusion. No pneumothorax. No acute osseous abnormality. Old left posterior sixth and seventh rib fractures, new since the prior study. IMPRESSION: 1. Mild right basilar atelectasis versus infiltrate. 2. Trace left pleural effusion. 3. COPD. Electronically Signed   By: Titus Dubin M.D.   On: 01/15/2020 11:30    Medications:  I have reviewed the patient's current medications. Scheduled: . atorvastatin  10 mg Oral Daily  . clopidogrel  75 mg Oral Daily  . enoxaparin (LOVENOX) injection  40 mg Subcutaneous Q24H  . hydroxyurea  500 mg Oral Q M,W,F  . sodium chloride flush  3 mL Intravenous Q12H    Assessment/Plan: 86 year old femalewith medical history significant ofCOPD,sick sinus syndrome s/ppermanent pacemaker, Acousticneuroma,meningioma,chronic back pain and TIAswho presents with altered mental status and seizure like activity. Head CT personally reviewed and shows no evidence of acute changes. MRI unable to be performed due to the presence of a pacemaker therefore can not rule out the possibility of a small embolic event that can not be visualized on CT or TIA. Larger event not likely since patient with nonfocal neurological examination. Seizure is on the differential as well. Lab work is unremarkable including serum magnesium, serum phosphorus and TSH. Etiology undetermined but EEG showed left fronto-temporal slowing.  Concern  remained for seizure.  Patient placed on Keppra.  Yesterday patient lethargic and hallucinating.  Keppra discontinued and patient much better this morning.  AMS on yesterday likely a Keppra side effect.  Since this is first presentation will discontinue anticonvulsants and follow clinically as an outpatient.    Recommendations: 1. Patient to remain off Keppra and follow up with neurology on an outpatient basis.   2. Would continue ASA 81 mg and Plavix 75mg  daily for three weeks before return to Plavix 75mg  daily  3. Continue statin   LOS: 2 days   [name], MD Neurology [phone] 01/17/2020  10:17 AM

## 2020-01-17 NOTE — TOC Transition Note (Addendum)
Transition of Care [facility]) - CM/SW Discharge Note   Patient Details  Name: Krista Ortiz MRN: [id number] Date of Birth: May 12, 1924  [facility]) CM/SW Contact:  Shelbie Hutching, RN Phone Number: 01/17/2020, 11:05 AM   Clinical Narrative:    Patient has been cleared by neurology and is ready to discharge home.  Patient will have home health services through [facility] aware of discharge today.  Patient will need EMS to transport home.  RNCM will arrange transport.  Outpatient palliative referral given to Margaretmary Eddy with [facility].    Final next level of care: Home w Home Health Services Barriers to Discharge: Barriers Resolved   Patient Goals and CMS Choice   CMS Medicare.gov Compare Post Acute Care list provided to:: Patient Represenative (must comment) Choice offered to / list presented to : Adult Children  Discharge Placement                Patient to be transferred to facility by: [facility] will transport patient home Name of family member notified: Gwyndolyn Saxon Patient and family notified of of transfer: 01/17/20  Discharge Plan and Services   Discharge Planning Services: CM Consult Post Acute Care Choice: Home Health                    HH Arranged: PT, OT, Social Work [facility] [facility] Agency: [place] Date [place]: 01/17/20 Time [place]: [id number] Representative spoke with at [place]: [place] ([place]) Interventions     Readmission Risk Interventions No flowsheet data found.

## 2020-01-17 NOTE — Progress Notes (Signed)
[facility]) Hospital Liaison RN note  This patient has been referred for community based Palliative Care to be followed by [facility] at discharge.  Please call with any questions or concerns.  Thank you. Margaretmary Eddy, BSN, RN [facility] Liaison [phone]

## 2020-01-18 ENCOUNTER — Telehealth: Payer: Self-pay | Admitting: Nurse Practitioner

## 2020-01-18 NOTE — Telephone Encounter (Signed)
Called patient's son Jeneen Rinks to schedule the Palliative Consult and spoke with his wife Hassan Rowan.  She said that he was at patient's home and gave me his cell number for me to call him.  Called Jeneen Rinks and spoke with him regarding Palliative services and he has requested that I speak with his brother, who is the main caregiver for patient.  He took my name and number and said that he would have him call me back tomorrow.

## 2020-01-19 ENCOUNTER — Telehealth: Payer: Self-pay | Admitting: Adult Health Nurse Practitioner

## 2020-01-19 NOTE — Telephone Encounter (Signed)
Rec'd call back from patient's son Rush Landmark and after discussing Palliative services with him he was in agreement with this.  I have scheduled an In-person Consult for 01/24/20 @ 2:30 PM.

## 2020-01-20 NOTE — Discharge Summary (Signed)
[place] [facility] [place] NAME: Krista Ortiz    MR#:  [id number]  DATE OF BIRTH:  [birth year]  DATE OF ADMISSION:  01/14/2020   ADMITTING PHYSICIAN: Ezekiel Slocumb, DO  DATE OF DISCHARGE: 01/17/2020  3:41 PM  PRIMARY CARE PHYSICIAN: Hortencia Pilar, MD   ADMISSION DIAGNOSIS:  Altered mental status, unspecified altered mental status type [R41.82] Altered mental status, unspecified [R41.82] Acute encephalopathy [G93.40] DISCHARGE DIAGNOSIS:  Principal Problem:   Altered mental status, unspecified Active Problems:   Sick sinus syndrome (HCC)   Polycythemia vera (HCC)   Peripheral vascular disease (HCC)   COPD (chronic obstructive pulmonary disease) (HCC)   Seizure-like activity (HCC)   Cough   Diarrhea   Acute encephalopathy  SECONDARY DIAGNOSIS:   Past Medical History:  Diagnosis Date  . Acoustic neuroma [facility])    md notes says history of acoustic neuroma or meningioma followed up by yearly MRI  . Chronic back pain   . COPD (chronic obstructive pulmonary disease) ([place])   . Hyperlipidemia   . Knee joint replacement status, right 11/25/2016  . Polycythemia   . Polycythemia, secondary 04/26/2015  . Port-A-Cath in place   . Presence of permanent cardiac pacemaker   . PVD (peripheral vascular disease) ([place])   . Right wrist fracture 11/25/2016  . Thoracic compression fracture, closed, initial encounter ([place]) 11/25/2016  . TIA (transient ischemic attack)    HOSPITAL COURSE:  86 year old female with a known history of COPD, sick sinus syndrome s/ppermanent pacemaker, polycythemia vera, acousticneuroma,meningioma,chronic back pain and TIAs admitted for altered mental status after son witness seizure-like activity while check in on patient.  Lives alone and independent for ADLs.Son noted patient was less responsive than usual, and had not filled her pill box or done other daily routine activities, found her asleep slumped forward in her  recliner.  She woke up and they were watching TV when son saw patient's whole body become "stiff as a board" with eyes rolled back, which lasted about a minute or so.  Following this, patient was lethargic and had garbled speech.  No history of seizures.     Acute metabolic encephalopathy: Present on admission - Head CT shows no evidence of acute changes.  -MRI unable to be performed due to the presence of a pacemaker therefore can not rule out the possibility of a small embolic event that can not be visualized on CT or TIA.  -Larger event not likely since patient with nonfocal neurological examination.  -close to her baseline now at discharge  Seizure-like activity - No history of seizures.  She has been on Cipro for 2 months which can lower seizure threshold.  Syncopal episode also on differential, but patient was reportedly post-ictal / lethargic following episode.  -Lab work is unremarkable including serum magnesium, serum phosphorus and TSH.  -Etiology undeterminedbut EEG showed left fronto-temporal slowing. Concern remained for seizure. Patient placed on Keppra.   In 24 hours patient became lethargic and hallucinating soKeppra discontinued and patient much better this morning discharge.  AMS in the hospital was thought to be Keppra side effect.  -Per neurology, Since this is first presentation - discontinue anticonvulsants and follow clinically as an outpatient.   - Patient to remain off Keppra and follow up with neurology on an outpatient basis.   - continue Plavix 75mg  dailyand statin.  Consider adding aspirin if her PCP and/or outpatient neurology in agreement  Diarrhea - has been on Cipro  for 2 months for ear infection.  Resolved after stopping Cipro.  Dysphagia/esophageal phase dysmotility -Mild aspiration risk    DISCHARGE CONDITIONS:  [place] CONSULTS OBTAINED:  Treatment Team:  Catarina Hartshorn, MD DRUG ALLERGIES:    DISCHARGE MEDICATIONS:   Allergies as of  01/17/2020      Reactions   Chlorhexidine    Citalopram Other (See Comments)   Reaction:  Fatigue    Pneumococcal Vaccine Other (See Comments)   Doesn't remember what occurred.   Pneumovax [pneumococcal Polysaccharide Vaccine] Other (See Comments)   Reaction:  Unknown    Requip [ropinirole Hcl] Other (See Comments)   Reaction:  Dry mouth    Penicillins Rash, Other (See Comments)          Medication List    STOP taking these medications   ciprofloxacin 500 MG tablet Commonly known as: CIPRO   furosemide 20 MG tablet Commonly known as: LASIX   potassium chloride 10 MEQ tablet Commonly known as: KLOR-CON     TAKE these medications   acetaminophen 325 MG tablet Commonly known as: TYLENOL Take 650 mg by mouth every 4 (four) hours as needed for mild pain or moderate pain.   atorvastatin 10 MG tablet Commonly known as: LIPITOR Take 10 mg by mouth daily.   clopidogrel 75 MG tablet Commonly known as: PLAVIX Take 75 mg by mouth daily.   hydroxyurea 500 MG capsule Commonly known as: HYDREA Take 500 mg by mouth every Monday, Wednesday, and Friday.   ipratropium-albuterol 0.5-2.5 (3) MG/3ML Soln Commonly known as: DUONEB Take 3 mLs by nebulization every 6 (six) hours as needed (shortness of breath or wheezing).      DISCHARGE INSTRUCTIONS:  Strict aspiration precaution Mech Soft consistency diet w/ any meats well-cut; moistened foods; Thin liquids VIA CUP ONLY. General aspiration precautions; Esophageal dysmotility and Reflux precautions. Support and monitoring at meals as needed for follow through w/ precautions(monitoring by family members when at home) Medication Administration: Whole meds with puree(vs Crushed in larger pills) DIET:  Cardiac diet DISCHARGE CONDITION:  [place] ACTIVITY:  Activity as tolerated OXYGEN:  Home Oxygen: No.  Oxygen Delivery: room air DISCHARGE LOCATION:  home with home health and palliative care follow-up  If you experience worsening  of your admission symptoms, develop shortness of breath, life threatening emergency, suicidal or homicidal thoughts you must seek medical attention immediately by calling 911 or calling your MD immediately  if symptoms less severe.  You Must read complete instructions/literature along with all the possible adverse reactions/side effects for all the Medicines you take and that have been prescribed to you. Take any new Medicines after you have completely understood and accpet all the possible adverse reactions/side effects.   Please note  You were cared for by a hospitalist during your hospital stay. If you have any questions about your discharge medications or the care you received while you were in the hospital after you are discharged, you can call the unit and asked to speak with the hospitalist on call if the hospitalist that took care of you is not available. Once you are discharged, your primary care physician will handle any further medical issues. Please note that NO REFILLS for any discharge medications will be authorized once you are discharged, as it is imperative that you return to your primary care physician (or establish a relationship with a primary care physician if you do not have one) for your aftercare needs so that they can reassess your need for medications and monitor your  lab values.    On the day of Discharge:  VITAL SIGNS:  Blood pressure (!) 136/56, pulse 83, temperature 98.6 F (37 C), resp. rate 18, height 5\' 6"  (1.676 m), weight 57.2 kg, SpO2 97 %. PHYSICAL EXAMINATION:  GENERAL:  86 y.o.-year-old patient lying in the bed with no acute distress.  EYES: Pupils equal, round, reactive to light and accommodation. No scleral icterus. Extraocular muscles intact.  HEENT: Head atraumatic, normocephalic. Oropharynx and nasopharynx clear.  NECK:  Supple, no jugular venous distention. No thyroid enlargement, no tenderness.  LUNGS: Normal breath sounds bilaterally, no wheezing,  rales,rhonchi or crepitation. No use of accessory muscles of respiration.  CARDIOVASCULAR: S1, S2 normal. No murmurs, rubs, or gallops.  ABDOMEN: Soft, non-tender, non-distended. Bowel sounds present. No organomegaly or mass.  EXTREMITIES: No pedal edema, cyanosis, or clubbing.  NEUROLOGIC: Cranial nerves II through XII are intact. Muscle strength 5/5 in all extremities. Sensation intact. Gait not checked.  PSYCHIATRIC: The patient is alert and oriented x 3.  SKIN: No obvious rash, lesion, or ulcer.  DATA REVIEW:   CBC Recent Labs  Lab 01/17/20 0419  WBC 3.1*  HGB 11.7*  HCT 34.3*  PLT 262    Chemistries  Recent Labs  Lab 01/14/20 1245 01/14/20 1909 01/15/20 0450 01/16/20 0317  NA 137  --    < > 139  K 4.5  --    < > 4.1  CL 102  --    < > 105  CO2 23  --    < > 29  GLUCOSE 142*  --    < > 90  BUN 13  --    < > 15  CREATININE 1.33*  --    < > 0.93  CALCIUM 8.8*  --    < > 8.8*  MG  --  1.7  --   --   AST 23  --   --   --   ALT 10  --   --   --   ALKPHOS 75  --   --   --   BILITOT 1.0  --   --   --    < > = values in this interval not displayed.     Outpatient follow-up Follow-up Information    Sharyne Peach, MD. Go on 01/25/2020.   Specialty: Family Medicine Why: @2 :00 PM  Contact information: [place] [address] [phone]        Vladimir Crofts, MD. Go on 01/18/2020.   Specialty: Neurology Why: @10 :30 AM  Contact information: [facility] [place] [address] [phone]            Management plans discussed with the patient, family and they are in agreement.  CODE STATUS: Prior   TOTAL TIME TAKING CARE OF THIS PATIENT: 45 minutes.    Max Sane M.D on 01/20/2020 at 2:43 PM  [facility]   CC: Primary care physician; Hortencia Pilar, MD   Note: This dictation was prepared with Dragon dictation along with smaller phrase technology. Any transcriptional errors that  result from this process are unintentional.

## 2020-01-24 ENCOUNTER — Other Ambulatory Visit: Payer: Medicare Other | Admitting: Adult Health Nurse Practitioner

## 2020-01-24 ENCOUNTER — Other Ambulatory Visit: Payer: Self-pay

## 2020-01-24 DIAGNOSIS — D751 Secondary polycythemia: Secondary | ICD-10-CM

## 2020-01-24 DIAGNOSIS — Z515 Encounter for palliative care: Secondary | ICD-10-CM

## 2020-01-24 NOTE — Progress Notes (Signed)
[facility] Consult Note Telephone: [phone]  Fax: [phone]  PATIENT NAME: Krista Ortiz DOB: 1924-06-23 MRN: [id number]  PRIMARY CARE PROVIDER:   Hortencia Pilar, MD  REFERRING PROVIDER:  Hortencia Pilar, [address] [place],  [place] [address]  RESPONSIBLE PARTY:   Self  [phone] [place], [place]  [phone] Qirat, Rauth  [phone]    RECOMMENDATIONS and PLAN:  1.  Advanced care planning.  Patient is a full code. Started going over MOST form.  Left blank MOST form for family to go over together and will go over at next visit.    2.  Functional status.  Patient was hospitalized 5/9-5/12/21 for AMS of unknown source.  Prior to hospitalization she was using a walker to ambulate and to perform her own ADLs.  She lives alone and was taking care of her apartment with help from her children.  She can still use walker to ambulate but has become weaker , slower  and is requiring more help with ADLs. Her first night home she did have some confusion and had a near fall.  States that she does soreness in her left hip since.  Son is uncomfortable helping with giving her a shower. She is continent of [place].  She does state having increased SOB with activity.  She is too weak to to take care of house cleaning but is able to do light meal prep for herself. Son states that home health services were recommended upon discharge from the hospital.  She would benefit from home health services to evaluate and treat due to weakness after hospitalization for AMS. Will reach out to PCP for referral for home health and to see if she may qualify for personal care services through [facility].  3.  Seizures.  Prior to going to hospital patient had seizure activity believed to be caused by Cipro she was taking for an ear infection.  She was started on Keppra in the hospital and this caused increased confusion and hallucinations. Patient has seen  neurology since coming home and has been started on lamictal.  She denies any seizure activity since coming home.  Continue follow up and recommendations by neurology.  4.  COPD/PVD/CKD stage 3.  Patient uses O2 at 2L at night.  Does not have to use during the day.  As above since being in the hospital she has been having increased SOB with activity.  She did have right basilar atelectasis and trace left pleural effusion on chest xray in the hospital.  She does state that she has some SOB in the morning that is relieved with duoneb treatment but this is unchanged from baseline.  Son states that she has had some of her medications discontinued upon discharge and is uncertain if to restart them.  She has appointment with PCP tomorrow and discussed that this should be discussed at this visit.    Palliative care will continue to monitor for symptom management/decline and make recommendations as needed.  Next appointment is in 4 weeks      I spent 90 minutes providing this consultation,  from 2:30 to 4:00 including time spent with patient/family, chart review, provider coordination, documentation. More than 50% of the time in this consultation was spent coordinating communication.   HISTORY OF PRESENT ILLNESS:  Krista Ortiz is a 86 y.o. year old female with multiple medical problems including polycythemia vera, PVD, COPD, CKD stage 3, h/o TIA. Patient being followed by Dr.  Janese Banks for her polycythemia vera. Palliative Care was asked to help address goals of care.   CODE STATUS: full code  PPS: 40% HOSPICE ELIGIBILITY/DIAGNOSIS: TBD  PHYSICAL EXAM:  BP 138/52  HR 71  O2 98% on RA General: NAD, frail appearing Cardiovascular: regular rate and rhythm Pulmonary: Lung sounds clear; normal respiratory effort Abdomen: soft, nontender, + bowel sounds GU: no suprapubic tenderness Extremities: 1+edema to feet and 2+edema to ankles, no joint deformities Skin: no rashes on exposed skin Neurological:  Weakness but otherwise nonfocal   PAST MEDICAL HISTORY:  Past Medical History:  Diagnosis Date  . Acoustic neuroma [facility])    md notes says history of acoustic neuroma or meningioma followed up by yearly MRI  . Chronic back pain   . COPD (chronic obstructive pulmonary disease) ([place])   . Hyperlipidemia   . Knee joint replacement status, right 11/25/2016  . Polycythemia   . Polycythemia, secondary 04/26/2015  . Port-A-Cath in place   . Presence of permanent cardiac pacemaker   . PVD (peripheral vascular disease) ([place])   . Right wrist fracture 11/25/2016  . Thoracic compression fracture, closed, initial encounter ([place]) 11/25/2016  . TIA (transient ischemic attack)     SOCIAL HX:  Social History   Tobacco Use  . Smoking status: Current Every Day Smoker    Packs/day: 0.50    Types: Cigarettes  . Smokeless tobacco: Never Used  Substance Use Topics  . Alcohol use: No    ALLERGIES:     PERTINENT MEDICATIONS:  Outpatient Encounter Medications as of 01/24/2020  Medication Sig  . acetaminophen (TYLENOL) 325 MG tablet Take 650 mg by mouth every 4 (four) hours as needed for mild pain or moderate pain.   Marland Kitchen atorvastatin (LIPITOR) 10 MG tablet Take 10 mg by mouth daily.  . clopidogrel (PLAVIX) 75 MG tablet Take 75 mg by mouth daily.  . hydroxyurea (HYDREA) 500 MG capsule Take 500 mg by mouth every Monday, Wednesday, and Friday.   Marland Kitchen ipratropium-albuterol (DUONEB) 0.5-2.5 (3) MG/3ML SOLN Take 3 mLs by nebulization every 6 (six) hours as needed (shortness of breath or wheezing).   Facility-Administered Encounter Medications as of 01/24/2020  Medication  . sodium chloride flush (NS) 0.9 % injection 10 mL     Jazzelle Zhang Jenetta Downer, NP

## 2020-01-25 ENCOUNTER — Telehealth: Payer: Self-pay

## 2020-01-25 NOTE — Telephone Encounter (Signed)
At the request of Amy NP,phone call placed to PCP office. Spoke with Lenna Sciara, medical assistant to provide update on NP visit. Per Amy, patient was in hospital and was to have [facility] services set up but had not heard anything at this time. Request for PCP to follow up with patient regarding this at   scheduled appointment today and  assist with PCS for increased help in the home.

## 2020-02-08 ENCOUNTER — Encounter: Payer: Self-pay | Admitting: Emergency Medicine

## 2020-02-08 ENCOUNTER — Other Ambulatory Visit: Payer: Self-pay

## 2020-02-08 DIAGNOSIS — Z950 Presence of cardiac pacemaker: Secondary | ICD-10-CM | POA: Diagnosis not present

## 2020-02-08 DIAGNOSIS — J441 Chronic obstructive pulmonary disease with (acute) exacerbation: Secondary | ICD-10-CM | POA: Diagnosis not present

## 2020-02-08 DIAGNOSIS — R799 Abnormal finding of blood chemistry, unspecified: Secondary | ICD-10-CM | POA: Diagnosis present

## 2020-02-08 DIAGNOSIS — E876 Hypokalemia: Secondary | ICD-10-CM | POA: Diagnosis not present

## 2020-02-08 DIAGNOSIS — Z96659 Presence of unspecified artificial knee joint: Secondary | ICD-10-CM | POA: Diagnosis not present

## 2020-02-08 DIAGNOSIS — F17210 Nicotine dependence, cigarettes, uncomplicated: Secondary | ICD-10-CM | POA: Insufficient documentation

## 2020-02-08 LAB — CBC WITH DIFFERENTIAL/PLATELET
Abs Immature Granulocytes: 0.02 10*3/uL (ref 0.00–0.07)
Basophils Absolute: 0 10*3/uL (ref 0.0–0.1)
Basophils Relative: 1 %
Eosinophils Absolute: 0.1 10*3/uL (ref 0.0–0.5)
Eosinophils Relative: 4 %
HCT: 33.4 % — ABNORMAL LOW (ref 36.0–46.0)
Hemoglobin: 11 g/dL — ABNORMAL LOW (ref 12.0–15.0)
Immature Granulocytes: 1 %
Lymphocytes Relative: 27 %
Lymphs Abs: 0.7 10*3/uL (ref 0.7–4.0)
MCH: 35.1 pg — ABNORMAL HIGH (ref 26.0–34.0)
MCHC: 32.9 g/dL (ref 30.0–36.0)
MCV: 106.7 fL — ABNORMAL HIGH (ref 80.0–100.0)
Monocytes Absolute: 0.3 10*3/uL (ref 0.1–1.0)
Monocytes Relative: 12 %
Neutro Abs: 1.5 10*3/uL — ABNORMAL LOW (ref 1.7–7.7)
Neutrophils Relative %: 55 %
Platelets: 274 10*3/uL (ref 150–400)
RBC: 3.13 MIL/uL — ABNORMAL LOW (ref 3.87–5.11)
RDW: 14.1 % (ref 11.5–15.5)
WBC: 2.6 10*3/uL — ABNORMAL LOW (ref 4.0–10.5)
nRBC: 0 % (ref 0.0–0.2)

## 2020-02-08 LAB — COMPREHENSIVE METABOLIC PANEL
ALT: 10 U/L (ref 0–44)
AST: 16 U/L (ref 15–41)
Albumin: 2.8 g/dL — ABNORMAL LOW (ref 3.5–5.0)
Alkaline Phosphatase: 88 U/L (ref 38–126)
Anion gap: 8 (ref 5–15)
BUN: 11 mg/dL (ref 8–23)
CO2: 31 mmol/L (ref 22–32)
Calcium: 8.4 mg/dL — ABNORMAL LOW (ref 8.9–10.3)
Chloride: 97 mmol/L — ABNORMAL LOW (ref 98–111)
Creatinine, Ser: 0.98 mg/dL (ref 0.44–1.00)
GFR calc Af Amer: 57 mL/min — ABNORMAL LOW (ref >60)
GFR calc non Af Amer: 49 mL/min — ABNORMAL LOW (ref >60)
Glucose, Bld: 126 mg/dL — ABNORMAL HIGH (ref 70–99)
Potassium: 2.6 mmol/L — CL (ref 3.5–5.1)
Sodium: 136 mmol/L (ref 135–145)
Total Bilirubin: 0.6 mg/dL (ref 0.3–1.2)
Total Protein: 5.4 g/dL — ABNORMAL LOW (ref 6.5–8.1)

## 2020-02-08 NOTE — ED Triage Notes (Signed)
Pt to triage via w/c with no distress noted, mask in place; pt denies c/o; pt received call stating her K+ was 2.5 this evening and was told to f/u here

## 2020-02-09 ENCOUNTER — Emergency Department
Admission: EM | Admit: 2020-02-09 | Discharge: 2020-02-09 | Disposition: A | Discharge status: Home / Self Care | Payer: Medicare Other | Attending: Emergency Medicine | Admitting: Emergency Medicine

## 2020-02-09 DIAGNOSIS — E876 Hypokalemia: Secondary | ICD-10-CM | POA: Diagnosis not present

## 2020-02-09 LAB — MAGNESIUM: Magnesium: 1.4 mg/dL — ABNORMAL LOW (ref 1.7–2.4)

## 2020-02-09 MED ORDER — POTASSIUM CHLORIDE 10 MEQ/100ML IV SOLN
10.0000 meq | INTRAVENOUS | Status: AC
  Administered 2020-02-09 (×4): 10 meq via INTRAVENOUS
  Filled 2020-02-09 (×4): qty 100

## 2020-02-09 MED ORDER — POTASSIUM CHLORIDE CRYS ER 10 MEQ PO TBCR: 10.0000 meq | EXTENDED_RELEASE_TABLET | Freq: Every day | ORAL | 0 refills | Status: AC

## 2020-02-09 MED ORDER — POTASSIUM CHLORIDE CRYS ER 20 MEQ PO TBCR
40.0000 meq | EXTENDED_RELEASE_TABLET | Freq: Once | ORAL | Status: AC
  Administered 2020-02-09: 40 meq via ORAL

## 2020-02-09 MED ORDER — POTASSIUM CHLORIDE 20 MEQ/15ML (10%) PO SOLN
40.0000 meq | Freq: Once | ORAL | Status: DC
  Filled 2020-02-09: qty 30

## 2020-02-09 NOTE — ED Provider Notes (Signed)
[facility] Emergency Department Provider Note  ____________________________________________   First MD Initiated Contact with Patient 02/09/20 [phone]     (approximate)  I have reviewed the triage vital signs and the nursing notes.   HISTORY  Chief Complaint Abnormal Lab    HPI Krista Ortiz is a 86 y.o. female with below list of previous medical conditions presents to the emergency department secondary to being notified 10:00 tonight that the patient had hypokalemia with a potassium of 2.5 from blood work that was performed yesterday.  Patient denies any complaints.  Patient son at bedside states that his mother has had a history of hypokalemia in the past and as such was prescribed potassium for home which was discontinued after her last hospitalization.        Past Medical History:  Diagnosis Date  . Acoustic neuroma [facility])    md notes says history of acoustic neuroma or meningioma followed up by yearly MRI  . Chronic back pain   . COPD (chronic obstructive pulmonary disease) ([place])   . Hyperlipidemia   . Knee joint replacement status, right 11/25/2016  . Polycythemia   . Polycythemia, secondary 04/26/2015  . Port-A-Cath in place   . Presence of permanent cardiac pacemaker   . PVD (peripheral vascular disease) ([place])   . Right wrist fracture 11/25/2016  . Thoracic compression fracture, closed, initial encounter ([place]) 11/25/2016  . TIA (transient ischemic attack)     Patient Active Problem List   Diagnosis Date Noted  . Seizure-like activity ([place]) 01/15/2020  . Cough 01/15/2020  . Diarrhea 01/15/2020  . Acute encephalopathy 01/15/2020  . Altered mental status, unspecified 01/14/2020  . Hip fracture ([place]) 11/09/2018  . COPD (chronic obstructive pulmonary disease) ([place]) 02/16/2017  . COPD exacerbation ([place]) 02/15/2017  . Polycythemia 04/02/2016  . Back ache 10/03/2015  . Chronic obstructive pulmonary disease ([place]) 10/03/2015  .  Dizziness 10/03/2015  . Decrease in the ability to hear 10/03/2015  . Neoplasm of meninges 10/03/2015  . Combined fat and carbohydrate induced hyperlipemia 10/03/2015  . Neuroma 10/03/2015  . Impaired vision 10/03/2015  . Presence of other vascular implants and grafts 10/03/2015  . Current smoker 10/03/2015  . Temporary cerebral vascular dysfunction 10/03/2015  . Abnormal CBC 06/12/2015  . Chronic LBP 06/12/2015  . Polycythemia vera ([place]) 04/26/2015  . Hypersensitivity to pneumococcal vaccine 02/21/2015  . Carotid artery narrowing 02/19/2015  . Sick sinus syndrome ([place]) 02/16/2015  . Syncope and collapse 02/15/2015  . Edema leg 10/25/2014  . TI (tricuspid incompetence) 10/25/2014  . Breathlessness on exertion 10/25/2014  . Chest pain at rest 10/03/2014  . Dyspnea, paroxysmal nocturnal 10/03/2014  . Amnesia 04/02/2014  . Abnormal weight loss 04/02/2014  . Osteopenia 03/15/2014  . Chronic pain 07/06/2013  . Peripheral vascular disease ([place]) 06/27/2013  . Ejection murmur 12/09/2012    Past Surgical History:  Procedure Laterality Date  . ABDOMINAL HYSTERECTOMY    . APPENDECTOMY    . BACK SURGERY    . cataracts    . CHOLECYSTECTOMY    . INTRAMEDULLARY (IM) NAIL INTERTROCHANTERIC Left 11/10/2018   Procedure: INTRAMEDULLARY (IM) NAIL INTERTROCHANTRIC;  Surgeon: Corky Mull, MD;  Location: [facility] ORS;  Service: Orthopedics;  Laterality: Left;  . PACEMAKER INSERTION Left 02/18/2015   Procedure: INSERTION PACEMAKER;  Surgeon: Isaias Cowman, MD;  Location: [facility] ORS;  Service: Cardiovascular;  Laterality: Left;  . REPLACEMENT TOTAL KNEE    . TONSILLECTOMY      Prior to Admission medications  Medication Sig Start Date End Date Taking? Authorizing Provider  acetaminophen (TYLENOL) 325 MG tablet Take 650 mg by mouth every 4 (four) hours as needed for mild pain or moderate pain.     [provider]  atorvastatin (LIPITOR) 10 MG tablet Take 10 mg by mouth daily.     [provider]  clopidogrel (PLAVIX) 75 MG tablet Take 75 mg by mouth daily.    [provider]  hydroxyurea (HYDREA) 500 MG capsule Take 500 mg by mouth every Monday, Wednesday, and Friday.     [provider]  ipratropium-albuterol (DUONEB) 0.5-2.5 (3) MG/3ML SOLN Take 3 mLs by nebulization every 6 (six) hours as needed (shortness of breath or wheezing).    [provider]    Allergies Chlorhexidine, Citalopram, Pneumococcal vaccine, Pneumovax [pneumococcal polysaccharide vaccine], Requip [ropinirole hcl], and Penicillins  Family History  Problem Relation Age of Onset  . Other Mother        Old age  . Other Father        Old age  . CVA Sister   . Heart attack Brother     Social History Social History   Tobacco Use  . Smoking status: Current Every Day Smoker    Packs/day: 0.50    Types: Cigarettes  . Smokeless tobacco: Never Used  Substance Use Topics  . Alcohol use: No  . Drug use: No    Review of Systems Constitutional: No fever/chills Eyes: No visual changes. ENT: No sore throat. Cardiovascular: Denies chest pain. Respiratory: Denies shortness of breath. Gastrointestinal: No abdominal pain.  No nausea, no vomiting.  No diarrhea.  No constipation. Genitourinary: Negative for dysuria. Musculoskeletal: Negative for neck pain.  Negative for back pain. Integumentary: Negative for rash. Neurological: Negative for headaches, focal weakness or numbness. Hematological/Lymphatic:  Positive for reported hypokalemia.   ____________________________________________   PHYSICAL EXAM:  VITAL SIGNS: ED Triage Vitals [02/08/20 2322]  Enc Vitals Group     BP (!) 143/46     Pulse Rate 78     Resp 18     Temp 99.1 F (37.3 C)     Temp Source Oral     SpO2 97 %     Weight 57.6 kg (127 lb)     Height 1.651 m (5\' 5" )     Head Circumference      Peak Flow      Pain Score 0     Pain Loc      Pain Edu?      Excl. in [place]?      Constitutional: Alert and oriented.  Eyes: Conjunctivae are normal.  Head: Atraumatic. Mouth/Throat: Patient is wearing a mask. Neck: No stridor.  No meningeal signs.   Cardiovascular: Normal rate, regular rhythm. Good peripheral circulation. Grossly normal heart sounds. Respiratory: Normal respiratory effort.  No retractions. Gastrointestinal: Soft and nontender. No distention.  Musculoskeletal: No lower extremity tenderness nor edema. No gross deformities of extremities. Neurologic:  Normal speech and language. No gross focal neurologic deficits are appreciated.  Skin:  Skin is warm, dry and intact. Psychiatric: Mood and affect are normal. Speech and behavior are normal.  ____________________________________________   LABS (all labs ordered are listed, but only abnormal results are displayed)  Labs Reviewed  CBC WITH DIFFERENTIAL/PLATELET - Abnormal; Notable for the following components:      Result Value   WBC 2.6 (*)    RBC 3.13 (*)    Hemoglobin 11.0 (*)    HCT 33.4 (*)  MCV 106.7 (*)    MCH 35.1 (*)    Neutro Abs 1.5 (*)    All other components within normal limits  COMPREHENSIVE METABOLIC PANEL - Abnormal; Notable for the following components:   Potassium 2.6 (*)    Chloride 97 (*)    Glucose, Bld 126 (*)    Calcium 8.4 (*)    Total Protein 5.4 (*)    Albumin 2.8 (*)    GFR calc non Af Amer 49 (*)    GFR calc Af Amer 57 (*)    All other components within normal limits  MAGNESIUM - Abnormal; Notable for the following components:   Magnesium 1.4 (*)    All other components within normal limits     Procedures   ____________________________________________   INITIAL IMPRESSION / MDM / ASSESSMENT AND PLAN / ED COURSE  As part of my medical decision making, I reviewed the following data within the electronic MEDICAL RECORD NUMBER  86 year old female presented with above-stated history and physical exam secondary to hypokalemia which was confirmed in the  emergency department.  Patient's potassium was 2.6.  Patient was given 40 mEq of potassium p.o. as well as 40 mEq of potassium IV 10 mEq/h.   ____________________________________________  FINAL CLINICAL IMPRESSION(S) / ED DIAGNOSES  Final diagnoses:  Hypokalemia     MEDICATIONS GIVEN DURING THIS VISIT:  Medications  potassium chloride 10 mEq in 100 mL IVPB (10 mEq Intravenous New Bag/Given 02/09/20 0624)  potassium chloride SA (KLOR-CON) CR tablet 40 mEq (40 mEq Oral Given 02/09/20 0342)     ED Discharge Orders    None      *Please note:  Krista Ortiz was evaluated in Emergency Department on 02/09/2020 for the symptoms described in the history of present illness. She was evaluated in the context of the global COVID-19 pandemic, which necessitated consideration that the patient might be at risk for infection with the SARS-CoV-2 virus that causes COVID-19. Institutional protocols and algorithms that pertain to the evaluation of patients at risk for COVID-19 are in a state of rapid change based on information released by regulatory bodies including the CDC and federal and state organizations. These policies and algorithms were followed during the patient's care in the ED.  Some ED evaluations and interventions may be delayed as a result of limited staffing during the pandemic.*  Note:  This document was prepared using Dragon voice recognition software and may include unintentional dictation errors.   Gregor Hams, MD 02/09/20 [phone]

## 2020-02-09 NOTE — ED Notes (Signed)
Pt brought in by son with co abnormal lab results. PCP called at 2230 with low potassium levels. Pt had labs drawn 6/3 for follow up after recent admission. Pt denies any new symptoms, co hip pain after fall a few weeks ago but no new pain. No co of other pain or discomfort, denies shob. Pt was taken off potassium and lasix while hospitalized. Was put back on fluid med after discharge but not potassium.

## 2020-02-09 NOTE — ED Notes (Signed)
Pt watching tv with son at bedside.

## 2020-02-09 NOTE — ED Notes (Signed)
Pt tolerating potassium well, family at bedside.

## 2020-02-09 NOTE — ED Provider Notes (Signed)
Vitals:   02/09/20 0930 02/09/20 0945  BP: (!) 137/59   Pulse: 68 74  Resp: 17 19  Temp:    SpO2: 93% 94%     Potassium completed.  Patient reports she feels better.  Resting comfortably.  Hearing aid battery went dead which makes speaking directly to her difficult, but her family at the bedside assists as well.  They have reports she feels and looks much better and has told her that she additionally that she feels well and ready to go.  Will discharge, recommended they contact PCP for close follow-up and anticipate a recheck of potassium in the near future through her primary.  Prescription for potassium provided by Dr. Rich Number, MD 02/09/20 1025

## 2020-02-20 ENCOUNTER — Other Ambulatory Visit: Payer: Self-pay

## 2020-02-20 ENCOUNTER — Other Ambulatory Visit: Payer: Medicare Other | Admitting: Adult Health Nurse Practitioner

## 2020-02-20 DIAGNOSIS — Z515 Encounter for palliative care: Secondary | ICD-10-CM

## 2020-02-20 DIAGNOSIS — D751 Secondary polycythemia: Secondary | ICD-10-CM

## 2020-02-20 NOTE — Progress Notes (Signed)
[facility] Consult Note Telephone: [phone]  Fax: [phone]  PATIENT NAME: Krista Ortiz DOB: 1924/01/23 MRN: [id number]  PRIMARY CARE PROVIDER:   Hortencia Pilar, MD  REFERRING PROVIDER:  Hortencia Pilar, [address] [place],  [place] [address]  RESPONSIBLE PARTY:   Self  [phone] [place], [place]  [phone] [place], [place]  [phone]       RECOMMENDATIONS and PLAN:  1.  Advanced care planning.  Patient is a full code.  Sons had questions about healthcare power of attorney.  Attempted to answer their questions.  Family wanted further discussion before filling out MOST form and wanted to fill out healthcare power of attorney forms prior to filling out most.  We will continue discussion at future visits  2.  CHF/COPD/PVD/CKD stage III.  Patient uses O2 at 2 L at night does not have to use O2 during the day.  Patient was seen in the ED per recommendation of PCP due to low potassium of 2.5. patient was infused with potassium in the ER and discharged.  Patient does have shortness of breath with activity.  Patient did have several episodes of loose stools a couple nights ago but this has resolved.  Patient started having significant edema and still has quite a bit of edema.  Sons do state that the edema has gone down since she has started taking her Lasix 40 mg daily which was discontinued after her last hospital stay.  She has restarted taking her potassium supplement as well.  Son does state that she has appointment with neurology to have an EEG done on June 25.  Continue follow-up and recommendations by PCP  3.  Functional status.  Patient uses walker to ambulate.  Son states that she has had 2 falls since coming home without injury.  Patient is scared to get into the shower due to fear of falling.  She just sponges all to keep clean.  Is able to dress herself and go to the bathroom.  Is unable to clean her apartment and  needs help preparing meals.  Son states that she was denied PCS services through [place] in the past.  Will reach out to our social worker to see if she qualifies for [facility] services through [facility] and what we can do to get those forms filled out for her.  Palliative will continue to monitor for symptom management/decline and make recommendations as needed.  Have next appointment in 4 weeks.  Encouraged to call with any questions or concerns.   I spent 80 minutes providing this consultation,  from 2:30 to 3:50 including time spent with patient/family, chart review, provider coordination, documentation. More than 50% of the time in this consultation was spent coordinating communication.   HISTORY OF PRESENT ILLNESS:  Mishika Flippen is a 86 y.o. year old female with multiple medical problems including polycythemia vera, PVD, COPD, CHF, CKD stage 3, h/o TIA. Patient being followed by Dr. Janese Banks for her polycythemia vera. Palliative Care was asked to help address goals of care.   CODE STATUS: full code  PPS: 40% HOSPICE ELIGIBILITY/DIAGNOSIS: TBD  PHYSICAL EXAM:  BP 138/58  HR 81  O2 97% on RA General: NAD, frail appearing Cardiovascular: regular rate and rhythm Pulmonary: Lung sounds clear; normal respiratory effort Abdomen: soft, nontender, + bowel sounds GU: no suprapubic tenderness Extremities: patient has significant edema to bilateral lower extremities, no joint deformities Skin: no rashes on exposed skin Neurological: Weakness but otherwise  nonfocal  PAST MEDICAL HISTORY:  Past Medical History:  Diagnosis Date  . Acoustic neuroma [facility])    md notes says history of acoustic neuroma or meningioma followed up by yearly MRI  . Chronic back pain   . COPD (chronic obstructive pulmonary disease) ([place])   . Hyperlipidemia   . Knee joint replacement status, right 11/25/2016  . Polycythemia   . Polycythemia, secondary 04/26/2015  . Port-A-Cath in place   . Presence of permanent cardiac  pacemaker   . PVD (peripheral vascular disease) ([place])   . Right wrist fracture 11/25/2016  . Thoracic compression fracture, closed, initial encounter ([place]) 11/25/2016  . TIA (transient ischemic attack)     SOCIAL HX:  Social History   Tobacco Use  . Smoking status: Current Every Day Smoker    Packs/day: 0.50    Types: Cigarettes  . Smokeless tobacco: Never Used  Substance Use Topics  . Alcohol use: No        PERTINENT MEDICATIONS:  Outpatient Encounter Medications as of 02/20/2020  Medication Sig  . acetaminophen (TYLENOL) 325 MG tablet Take 650 mg by mouth every 4 (four) hours as needed for mild pain or moderate pain.   Marland Kitchen atorvastatin (LIPITOR) 10 MG tablet Take 10 mg by mouth daily.  . clopidogrel (PLAVIX) 75 MG tablet Take 75 mg by mouth daily.  . hydroxyurea (HYDREA) 500 MG capsule Take 500 mg by mouth every Monday, Wednesday, and Friday.   Marland Kitchen ipratropium-albuterol (DUONEB) 0.5-2.5 (3) MG/3ML SOLN Take 3 mLs by nebulization every 6 (six) hours as needed (shortness of breath or wheezing).  . potassium chloride (KLOR-CON) 10 MEQ tablet Take 1 tablet (10 mEq total) by mouth daily.   Facility-Administered Encounter Medications as of 02/20/2020  Medication  . sodium chloride flush (NS) 0.9 % injection 10 mL     Ryon Layton Jenetta Downer, NP

## 2020-02-29 IMAGING — CT CT HEAD W/O CM
4 of 5 series · 16 of 47 positions shown, 18 images · non-contrast
Comparison: November 09, 2018

CLINICAL DATA: Mechanical fall

EXAM:
CT HEAD WITHOUT CONTRAST; CT CERVICAL SPINE WITHOUT CONTRAST
TECHNIQUE: Contiguous axial images were obtained from the base of the skull
through the vertex without intravenous contrast.

[Series 2: head wo · axial · 0.42mm/px · z∈[+230,+335]mm · 6 of 31 slices shown, 8 images]
[im 5/31  brain]
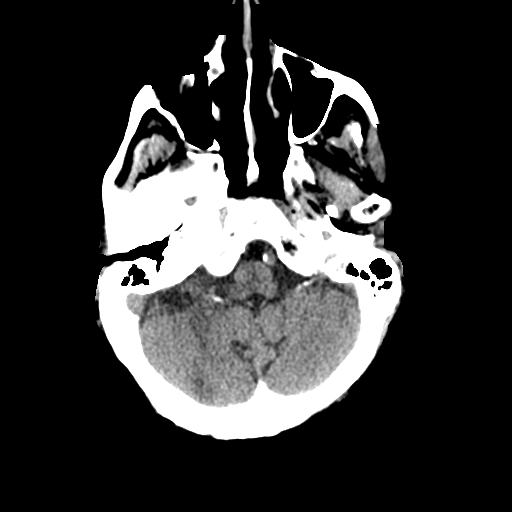
[im 5/31  bone]
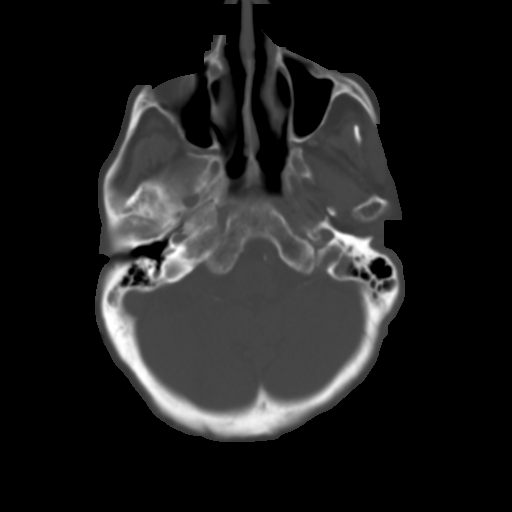
[im 9/31  brain]
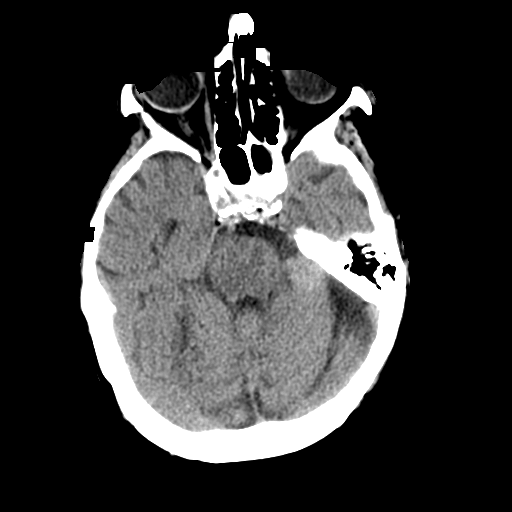
[im 13/31  brain]
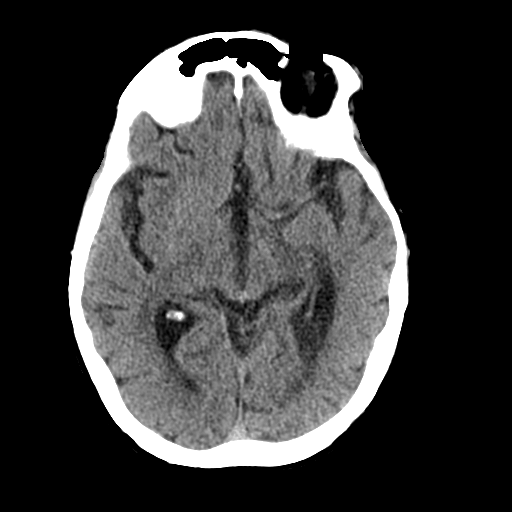
[im 18/31  brain]
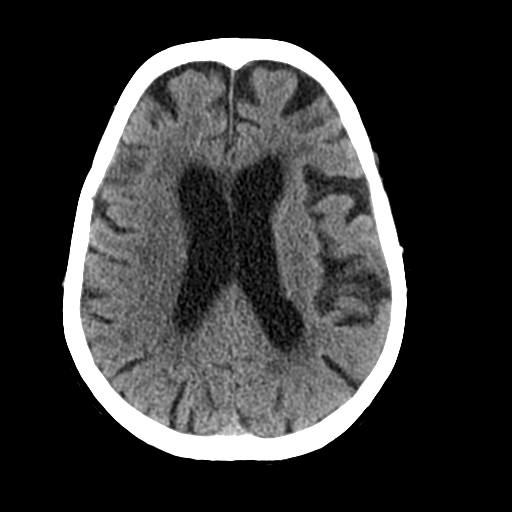
[im 22/31  brain]
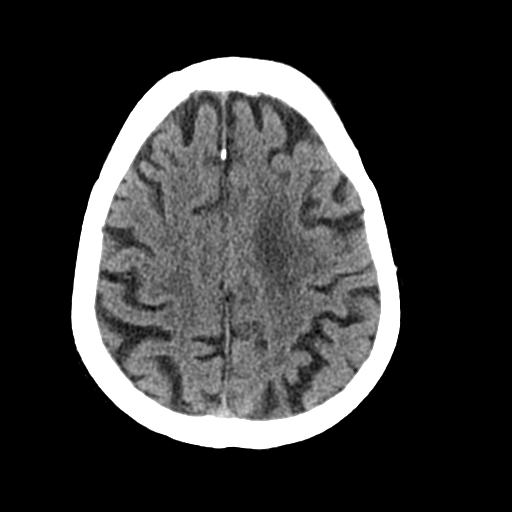
[im 22/31  bone]
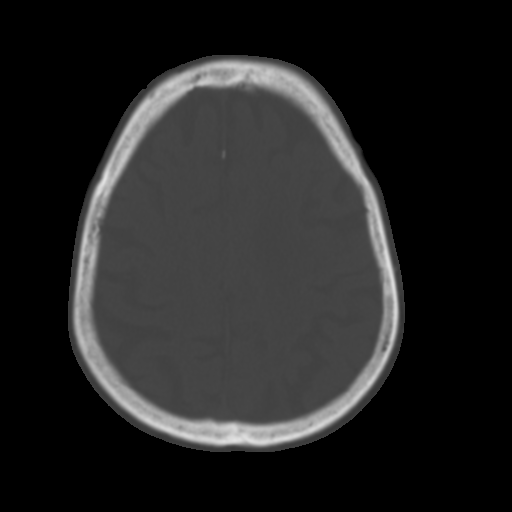
[im 26/31  brain]
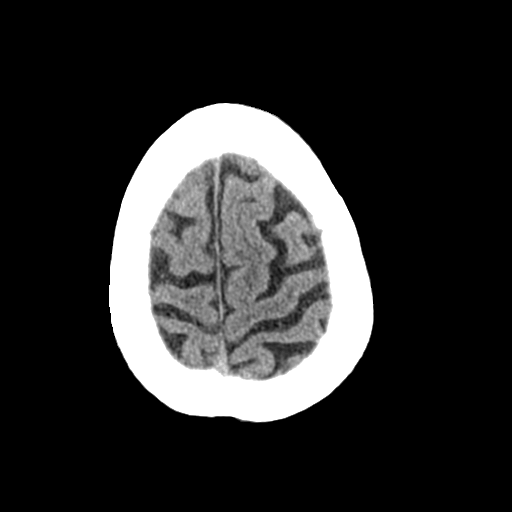

[Series 4: coronal soft tissue · coronal · 0.30mm/px · 3 of 66 slices shown]
[im 22/66  brain]
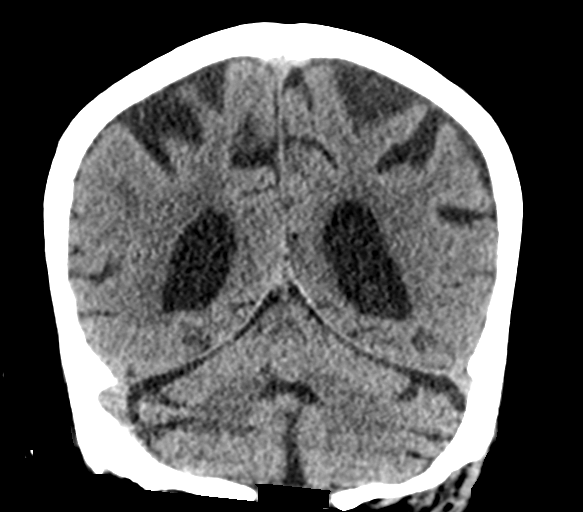
[im 29/66  brain]
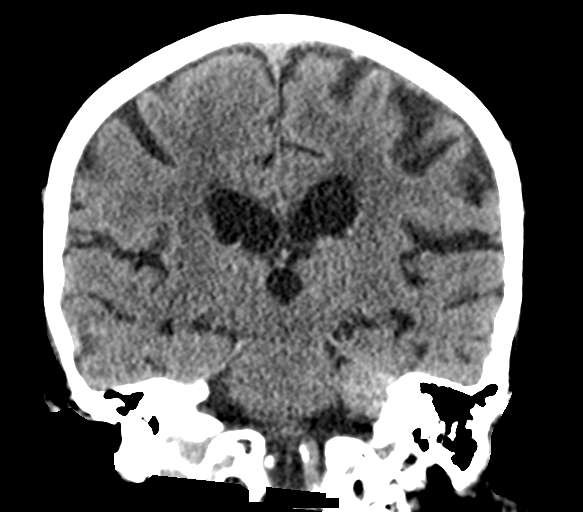
[im 37/66  brain]
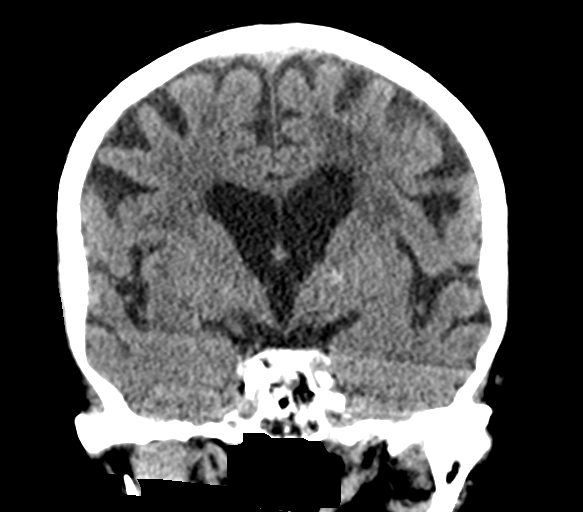

[Series 5: sagittal soft tissue · sagittal · 0.30mm/px · 1 of 54 slices shown]
[im 27/54  brain]
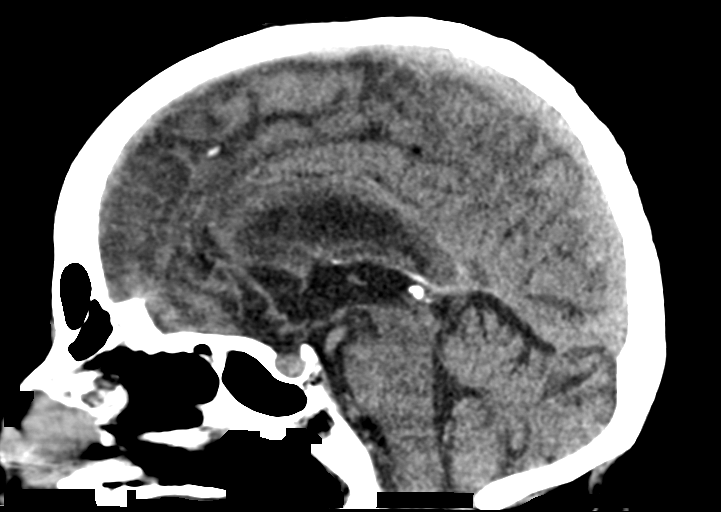

[Series 7: c spine soft · axial · 0.34mm/px · z∈[+98,+198]mm · 6 of 85 slices shown]
[im 9/85  brain]
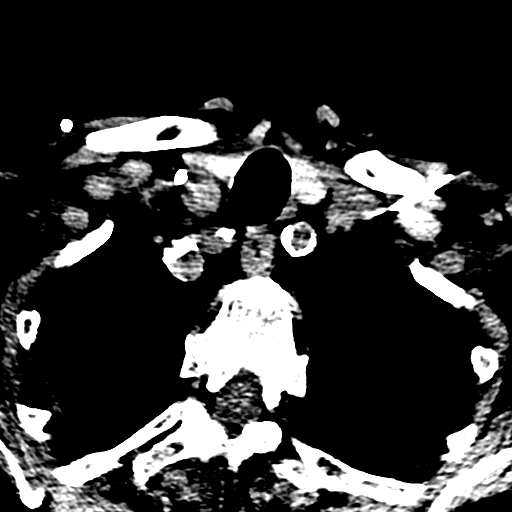
[im 17/85  brain]
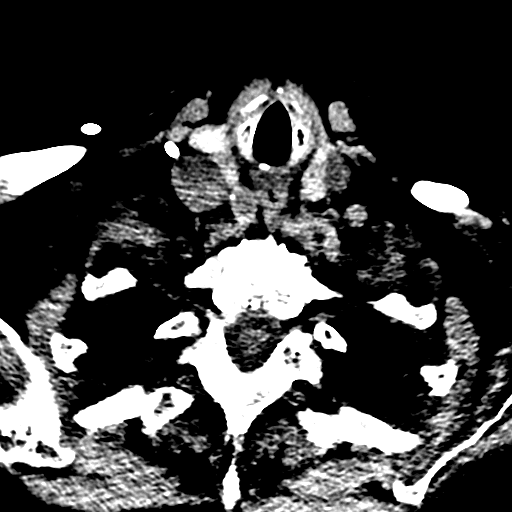
[im 29/85  brain]
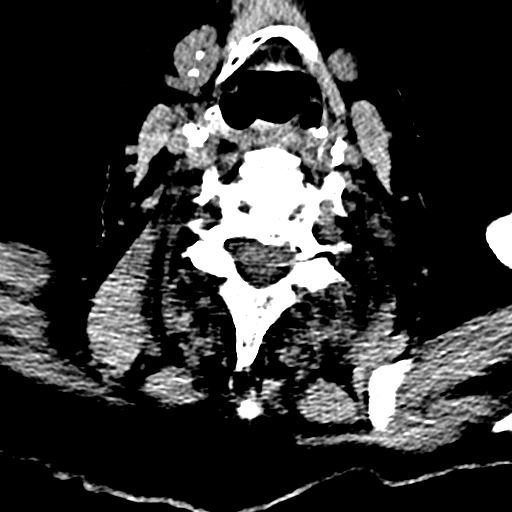
[im 37/85  brain]
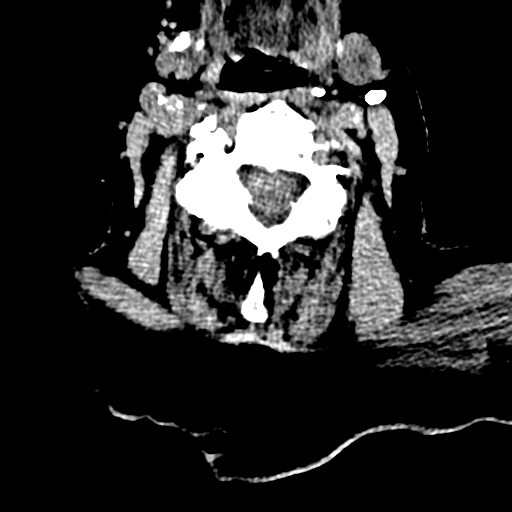
[im 49/85  brain]
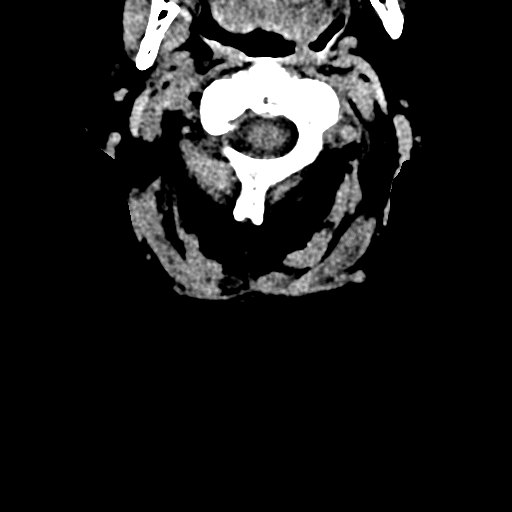
[im 57/85  brain]
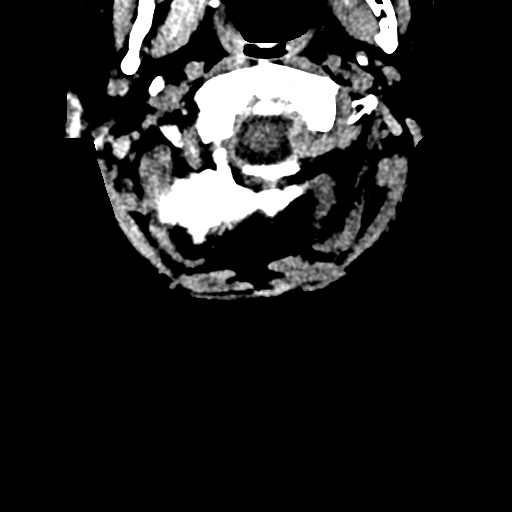

[16 of 47 positions shown; findings below may reference images not displayed]

FINDINGS: Brain: No evidence of acute territorial infarction, hemorrhage,
hydrocephalus,extra-axial collection or mass lesion/mass effect.
Again noted is a hyperdense mass seen at the left cerebellar pontine
angle measuring up to 2 cm, likely meningioma. There is dilatation
the ventricles and sulci consistent with age-related atrophy.
Low-attenuation changes in the deep white matter consistent with
small vessel ischemia.

Vascular: No hyperdense vessel. Calcifications are seen within the
internal carotid artery and vertebral arteries.

Skull: The skull is intact. No fracture or focal lesion identified.

Sinuses/Orbits: Small amount of bilateral maxillary mucosal
thickening seen. The orbits and globes intact.

Other: None

Cervical spine:

Alignment: There is minimal anterolisthesis of C4 on C5 measuring 3
mm. There is slight reversal of the normal cervical lordosis.

Skull base and vertebrae: Visualized skull base is intact. No
atlanto-occipital dissociation. The vertebral body heights are well
maintained. No fracture or pathologic osseous lesion seen. There is
calcified capsular hypertrophy seen around the atlantoaxial
interval.

Soft tissues and spinal canal: The visualized paraspinal soft
tissues are unremarkable. No prevertebral soft tissue swelling is
seen. The spinal canal is grossly unremarkable, no large epidural
collection or significant canal narrowing.

Disc levels: Multilevel degenerative changes are seen most notable
at C5-C6 and C6-C7 with disc osteophyte complex and uncovertebral
osteophytes.

Upper chest: The lung apices are clear. Thoracic inlet is within
normal limits. Scattered vascular calcifications are noted.

Other: None
IMPRESSION: 1. No acute intracranial abnormality.
2. Probable 2 cm left CP angle meningioma, unchanged from prior
3.  No acute fracture or malalignment of the spine.
4. Multilevel degenerative changes with grade 1 anterolisthesis of
C4 on C5.

## 2020-03-14 ENCOUNTER — Emergency Department: Payer: Medicare Other

## 2020-03-14 ENCOUNTER — Other Ambulatory Visit: Payer: Self-pay

## 2020-03-14 ENCOUNTER — Inpatient Hospital Stay
Admission: EM | Admit: 2020-03-14 | Discharge: 2020-04-07 | DRG: 082 | Disposition: E | Payer: Medicare Other | Attending: Internal Medicine | Admitting: Internal Medicine

## 2020-03-14 DIAGNOSIS — S065X9A Traumatic subdural hemorrhage with loss of consciousness of unspecified duration, initial encounter: Secondary | ICD-10-CM | POA: Diagnosis not present

## 2020-03-14 DIAGNOSIS — Y92009 Unspecified place in unspecified non-institutional (private) residence as the place of occurrence of the external cause: Secondary | ICD-10-CM

## 2020-03-14 DIAGNOSIS — Z90710 Acquired absence of both cervix and uterus: Secondary | ICD-10-CM

## 2020-03-14 DIAGNOSIS — R4020 Unspecified coma: Secondary | ICD-10-CM | POA: Diagnosis present

## 2020-03-14 DIAGNOSIS — F17210 Nicotine dependence, cigarettes, uncomplicated: Secondary | ICD-10-CM | POA: Diagnosis present

## 2020-03-14 DIAGNOSIS — Z7902 Long term (current) use of antithrombotics/antiplatelets: Secondary | ICD-10-CM

## 2020-03-14 DIAGNOSIS — W19XXXA Unspecified fall, initial encounter: Secondary | ICD-10-CM

## 2020-03-14 DIAGNOSIS — Z20822 Contact with and (suspected) exposure to covid-19: Secondary | ICD-10-CM | POA: Diagnosis present

## 2020-03-14 DIAGNOSIS — J449 Chronic obstructive pulmonary disease, unspecified: Secondary | ICD-10-CM | POA: Diagnosis present

## 2020-03-14 DIAGNOSIS — I739 Peripheral vascular disease, unspecified: Secondary | ICD-10-CM | POA: Diagnosis present

## 2020-03-14 DIAGNOSIS — S0990XA Unspecified injury of head, initial encounter: Secondary | ICD-10-CM

## 2020-03-14 DIAGNOSIS — Z8673 Personal history of transient ischemic attack (TIA), and cerebral infarction without residual deficits: Secondary | ICD-10-CM

## 2020-03-14 DIAGNOSIS — W1830XA Fall on same level, unspecified, initial encounter: Secondary | ICD-10-CM | POA: Diagnosis present

## 2020-03-14 DIAGNOSIS — Z515 Encounter for palliative care: Secondary | ICD-10-CM

## 2020-03-14 DIAGNOSIS — G9340 Encephalopathy, unspecified: Secondary | ICD-10-CM | POA: Diagnosis present

## 2020-03-14 DIAGNOSIS — Z79899 Other long term (current) drug therapy: Secondary | ICD-10-CM

## 2020-03-14 DIAGNOSIS — Z66 Do not resuscitate: Secondary | ICD-10-CM | POA: Diagnosis present

## 2020-03-14 DIAGNOSIS — E785 Hyperlipidemia, unspecified: Secondary | ICD-10-CM | POA: Diagnosis present

## 2020-03-14 DIAGNOSIS — Z950 Presence of cardiac pacemaker: Secondary | ICD-10-CM

## 2020-03-14 DIAGNOSIS — Z9981 Dependence on supplemental oxygen: Secondary | ICD-10-CM

## 2020-03-14 DIAGNOSIS — Z86011 Personal history of benign neoplasm of the brain: Secondary | ICD-10-CM

## 2020-03-14 DIAGNOSIS — S065XAA Traumatic subdural hemorrhage with loss of consciousness status unknown, initial encounter: Secondary | ICD-10-CM

## 2020-03-14 DIAGNOSIS — Z96651 Presence of right artificial knee joint: Secondary | ICD-10-CM | POA: Diagnosis present

## 2020-03-14 DIAGNOSIS — G935 Compression of brain: Secondary | ICD-10-CM | POA: Diagnosis present

## 2020-03-14 LAB — CBC
HCT: 21.5 % — ABNORMAL LOW (ref 36.0–46.0)
HCT: 35.1 % — ABNORMAL LOW (ref 36.0–46.0)
Hemoglobin: 7.5 g/dL — ABNORMAL LOW (ref 12.0–15.0)
Hemoglobin: 12.4 g/dL (ref 12.0–15.0)
MCH: 34.9 pg — ABNORMAL HIGH (ref 26.0–34.0)
MCH: 34.7 pg — ABNORMAL HIGH (ref 26.0–34.0)
MCHC: 34.9 g/dL (ref 30.0–36.0)
MCHC: 35.3 g/dL (ref 30.0–36.0)
MCV: 100 fL (ref 80.0–100.0)
MCV: 98.3 fL (ref 80.0–100.0)
Platelets: 79 10*3/uL — ABNORMAL LOW (ref 150–400)
Platelets: 126 10*3/uL — ABNORMAL LOW (ref 150–400)
RBC: 2.15 MIL/uL — ABNORMAL LOW (ref 3.87–5.11)
RBC: 3.57 MIL/uL — ABNORMAL LOW (ref 3.87–5.11)
RDW: 14.1 % (ref 11.5–15.5)
RDW: 13.7 % (ref 11.5–15.5)
WBC: 1.2 10*3/uL — CL (ref 4.0–10.5)
WBC: 1.8 10*3/uL — ABNORMAL LOW (ref 4.0–10.5)
nRBC: 0 % (ref 0.0–0.2)
nRBC: 0 % (ref 0.0–0.2)

## 2020-03-14 LAB — COMPREHENSIVE METABOLIC PANEL
ALT: 12 U/L (ref 0–44)
AST: 20 U/L (ref 15–41)
Albumin: 2.8 g/dL — ABNORMAL LOW (ref 3.5–5.0)
Alkaline Phosphatase: 88 U/L (ref 38–126)
Anion gap: 10 (ref 5–15)
BUN: 14 mg/dL (ref 8–23)
CO2: 24 mmol/L (ref 22–32)
Calcium: 8.3 mg/dL — ABNORMAL LOW (ref 8.9–10.3)
Chloride: 97 mmol/L — ABNORMAL LOW (ref 98–111)
Creatinine, Ser: 1.28 mg/dL — ABNORMAL HIGH (ref 0.44–1.00)
GFR calc Af Amer: 41 mL/min — ABNORMAL LOW (ref >60)
GFR calc non Af Amer: 36 mL/min — ABNORMAL LOW (ref >60)
Glucose, Bld: 89 mg/dL (ref 70–99)
Potassium: 4.1 mmol/L (ref 3.5–5.1)
Sodium: 131 mmol/L — ABNORMAL LOW (ref 135–145)
Total Bilirubin: 1.1 mg/dL (ref 0.3–1.2)
Total Protein: 5.2 g/dL — ABNORMAL LOW (ref 6.5–8.1)

## 2020-03-14 MED ORDER — LORAZEPAM 1 MG PO TABS: 1.0000 mg | ORAL_TABLET | ORAL | Status: DC | PRN

## 2020-03-14 MED ORDER — ACETAMINOPHEN 325 MG PO TABS: 650.0000 mg | ORAL_TABLET | Freq: Four times a day (QID) | ORAL | Status: DC | PRN

## 2020-03-14 MED ORDER — ONDANSETRON HCL 4 MG PO TABS: 4.0000 mg | ORAL_TABLET | Freq: Four times a day (QID) | ORAL | Status: DC | PRN

## 2020-03-14 MED ORDER — NALOXONE HCL 2 MG/2ML IJ SOSY: 0.4000 mg | PREFILLED_SYRINGE | Freq: Once | INTRAMUSCULAR | Status: AC

## 2020-03-14 MED ORDER — LORAZEPAM 2 MG/ML IJ SOLN: 1.0000 mg | INTRAMUSCULAR | Status: DC | PRN

## 2020-03-14 MED ORDER — SODIUM CHLORIDE 0.9% FLUSH: 3.0000 mL | Freq: Once | INTRAVENOUS | Status: DC

## 2020-03-14 MED ORDER — ONDANSETRON HCL 4 MG/2ML IJ SOLN: 4.0000 mg | Freq: Four times a day (QID) | INTRAMUSCULAR | Status: DC | PRN

## 2020-03-14 MED ORDER — ACETAMINOPHEN 650 MG RE SUPP: 650.0000 mg | Freq: Four times a day (QID) | RECTAL | Status: DC | PRN

## 2020-03-14 MED ORDER — HALOPERIDOL LACTATE 2 MG/ML PO CONC
0.5000 mg | ORAL | Status: DC | PRN
  Filled 2020-03-14: qty 0.3

## 2020-03-14 MED ORDER — HALOPERIDOL 0.5 MG PO TABS
0.5000 mg | ORAL_TABLET | ORAL | Status: DC | PRN
  Filled 2020-03-14: qty 1

## 2020-03-14 MED ORDER — HALOPERIDOL LACTATE 5 MG/ML IJ SOLN: 0.5000 mg | INTRAMUSCULAR | Status: DC | PRN

## 2020-03-14 MED ORDER — LORAZEPAM 2 MG/ML PO CONC
1.0000 mg | ORAL | Status: DC | PRN
  Administered 2020-03-18: 18:00:00 1 mg via SUBLINGUAL
  Filled 2020-03-14: qty 1
  Filled 2020-03-14: qty 0.5

## 2020-03-14 MED ORDER — ONDANSETRON HCL 4 MG/2ML IJ SOLN
4.0000 mg | Freq: Once | INTRAMUSCULAR | Status: AC
  Administered 2020-03-14: 4 mg via INTRAVENOUS
  Filled 2020-03-14: qty 2

## 2020-03-14 MED ORDER — NALOXONE HCL 2 MG/2ML IJ SOSY
PREFILLED_SYRINGE | INTRAMUSCULAR | Status: AC
  Administered 2020-03-14: 0.4 mg via INTRAVENOUS
  Filled 2020-03-14: qty 2

## 2020-03-14 MED ORDER — MORPHINE SULFATE (PF) 2 MG/ML IV SOLN
2.0000 mg | Freq: Once | INTRAVENOUS | Status: AC
  Administered 2020-03-14: 2 mg via INTRAVENOUS
  Filled 2020-03-14: qty 1

## 2020-03-14 NOTE — ED Notes (Signed)
Pt desaturated to 80%. Pt placed on NRB at 15L. EDP Williams called to room.

## 2020-03-14 NOTE — ED Provider Notes (Signed)
ER Provider Note       Time seen: 6:00 PM    I have reviewed the vital signs and the nursing notes.  HISTORY   Chief Complaint Fall and Altered Mental Status   HPI Krista Ortiz is a 86 y.o. female with a history of COPD, hyperlipidemia, polycythemia, peripheral vascular disease who presents today for a fall.  According to EMS the patient fell at home twice.  Second time she appeared to be disoriented according to fire department.  She arrives alert and oriented on arrival.  Patient denies any complaints from the fall, wants to go home.  Past Medical History:  Diagnosis Date  . Acoustic neuroma [facility])    md notes says history of acoustic neuroma or meningioma followed up by yearly MRI  . Chronic back pain   . COPD (chronic obstructive pulmonary disease) ([place])   . Hyperlipidemia   . Knee joint replacement status, right 11/25/2016  . Polycythemia   . Polycythemia, secondary 04/26/2015  . Port-A-Cath in place   . Presence of permanent cardiac pacemaker   . PVD (peripheral vascular disease) ([place])   . Right wrist fracture 11/25/2016  . Thoracic compression fracture, closed, initial encounter ([place]) 11/25/2016  . TIA (transient ischemic attack)     Past Surgical History:  Procedure Laterality Date  . ABDOMINAL HYSTERECTOMY    . APPENDECTOMY    . BACK SURGERY    . cataracts    . CHOLECYSTECTOMY    . INTRAMEDULLARY (IM) NAIL INTERTROCHANTERIC Left 11/10/2018   Procedure: INTRAMEDULLARY (IM) NAIL INTERTROCHANTRIC;  Surgeon: Corky Mull, MD;  Location: [facility] ORS;  Service: Orthopedics;  Laterality: Left;  . PACEMAKER INSERTION Left 02/18/2015   Procedure: INSERTION PACEMAKER;  Surgeon: Isaias Cowman, MD;  Location: [facility] ORS;  Service: Cardiovascular;  Laterality: Left;  . REPLACEMENT TOTAL KNEE    . TONSILLECTOMY      Allergies Chlorhexidine, Citalopram, Pneumococcal vaccine, Pneumovax [pneumococcal polysaccharide vaccine], Requip [ropinirole hcl], and  Penicillins  Review of Systems Constitutional: Negative for fever. Cardiovascular: Negative for chest pain. Respiratory: Negative for shortness of breath. Gastrointestinal: Negative for abdominal pain, vomiting and diarrhea. Musculoskeletal: Negative for back pain. Skin: Negative for rash. Neurological: Negative for headaches, focal weakness or numbness.  All systems negative/normal/unremarkable except as stated in the HPI  ____________________________________________   PHYSICAL EXAM:  VITAL SIGNS: Vitals:   03/16/2020 1752  BP: 115/90  Pulse: 93  Resp: 18  SpO2: 98%   Constitutional: Alert and oriented. Well appearing and in no distress. Eyes: Conjunctivae are normal. Normal extraocular movements. ENT      Head: Normocephalic with no identifiable scalp trauma      Nose: No congestion/rhinnorhea.      Mouth/Throat: Mucous membranes are moist.      Neck: No stridor. Cardiovascular: Normal rate, regular rhythm. No murmurs, rubs, or gallops. Respiratory: Normal respiratory effort without tachypnea nor retractions. Breath sounds are clear and equal bilaterally. No wheezes/rales/rhonchi. Gastrointestinal: Soft and nontender. Normal bowel sounds Musculoskeletal: Lower extremity edema, unremarkable but limited range of motion. Neurologic:  Normal speech and language. No gross focal neurologic deficits are appreciated.  Skin:  Skin is warm, dry and intact. No rash noted. Psychiatric: Speech and behavior are normal.  ___________________________________________   LABS (pertinent positives/negatives)  Labs Reviewed  COMPREHENSIVE METABOLIC PANEL - Abnormal; Notable for the following components:      Result Value   Sodium 131 (*)    Chloride 97 (*)    Creatinine, Ser 1.28 (*)  Calcium 8.3 (*)    Total Protein 5.2 (*)    Albumin 2.8 (*)    GFR calc non Af Amer 36 (*)    GFR calc Af Amer 41 (*)    All other components within normal limits  CBC - Abnormal; Notable for the  following components:   WBC 1.2 (*)    RBC 2.15 (*)    Hemoglobin 7.5 (*)    HCT 21.5 (*)    MCH 34.9 (*)    Platelets 79 (*)    All other components within normal limits  URINALYSIS, COMPLETE (UACMP) WITH MICROSCOPIC  CBG MONITORING, ED    RADIOLOGY  Images were viewed by me CT head, CT C-spine, chest x-ray, pelvis x-ray IMPRESSION: 1. Acute holo hemispheric right subdural hematoma measuring up to 10 mm in thickness. Mass effect on the subjacent cerebral hemisphere. There is 6 mm right to left midline shift. 2. Unchanged atrophy and chronic small vessel ischemia. 3. Unchanged hyperdensity left CP angle mass, likely meningioma.  Critical Value/emergent results were called by telephone at the time of interpretation on 04/06/2020 at 7:01 pm to Dr Lenise Arena , who verbally acknowledged these results. IMPRESSION: Multilevel degenerative change in the cervical spine without acute fracture or subluxation. IMPRESSION: 1. No acute findings or evidence of traumatic injury. 2. Improved right basilar aeration from prior exam. 3. Possible small left pleural effusion, similar to prior. IMPRESSION: 1. No acute pelvic fracture. 2. Intact included left proximal femur hardware. IMPRESSION: Significant interval increase in the right hemispheric subdural hemorrhage with mass effect and 2 cm right to left midline shift and uncal herniation.  These results were called by telephone at the time of interpretation on 03/23/2020 at 9:58 pm to provider Lenise Arena , who verbally acknowledged these results.   DIFFERENTIAL DIAGNOSIS  Fall, fracture, subdural, dehydration, electrolyte abnormality, occult infection  ASSESSMENT AND PLAN  Fall, subdural hematoma, comfort measures   Plan: The patient had presented for a fall. Patient's labs initially were concerning for pancytopenia but on recheck only resemble leukopenia of uncertain etiology.  CT imaging initially revealed a 10 mm  subdural with 6 mm midline shift.  I had discussed with the [facility] transfer center and she had been accepted in transfer to [facility] by neurosurgery Dr. Brett Albino.  While awaiting transfer she had acute deterioration in her mental status.  We obtained repeat CT imaging which showed that the subdural had dramatically increased in size with a 2 cm right to left midline shift and herniation.  Pupils were dilated and unresponsive bilaterally.  Upon discussing with neurosurgery she has been made comfort measures and we have canceled the transfer.  Family has been present the entire time. Lenise Arena MD    Note: This note was generated in part or whole with voice recognition software. Voice recognition is usually quite accurate but there are transcription errors that can and very often do occur. I apologize for any typographical errors that were not detected and corrected.     Earleen Newport, MD 04/01/2020 2221

## 2020-03-14 NOTE — ED Notes (Signed)
Pt st using a walker at home and losing her balance and fell. Denies hitting her HA.

## 2020-03-14 NOTE — ED Notes (Signed)
Son at bedside.

## 2020-03-14 NOTE — ED Notes (Signed)
EDP Williams at bedside.

## 2020-03-14 NOTE — H&P (Signed)
History and Physical    Krista Ortiz QZR:[id number] DOB: [birth year] DOA: 03/19/2020  PCP: Hortencia Pilar, MD   Patient coming from: Home  I have personally briefly reviewed patient's old medical records in [place]  Chief Complaint: Fall  HPI: Krista Ortiz is a 86 y.o. female lives alone, with medical history significant for COPD, on nighttime oxygen, PVD , who presented to the emergency room by EMS following a fall at home, followed by disorientation.  On arrival, patient was alert and oriented, denied complaints and wanted to go home.  ED course: Initial work-up revealed an acute holohemispheric right subdural hematoma of 1 cm thickness with 6 mm midline shift.  Patient was accepted in transfer to [facility] neurosurgery however about 3 hours later, patient had a deterioration in her condition and became more obtunded with altered mental status.  A repeat CT head done showed significant interval increase in hematoma with mass-effect and a 2 cm right to left midline shift with uncal herniation.  The emergency room provider spoke with family who placed patient DNR and comfort care.  History is taken from ER records and son at bedside. Review of Systems: Unable to obtain due to altered mental status  Past Medical History:  Diagnosis Date  . Acoustic neuroma [facility])    md notes says history of acoustic neuroma or meningioma followed up by yearly MRI  . Chronic back pain   . COPD (chronic obstructive pulmonary disease) ([place])   . Hyperlipidemia   . Knee joint replacement status, right 11/25/2016  . Polycythemia   . Polycythemia, secondary 04/26/2015  . Port-A-Cath in place   . Presence of permanent cardiac pacemaker   . PVD (peripheral vascular disease) ([place])   . Right wrist fracture 11/25/2016  . Thoracic compression fracture, closed, initial encounter ([place]) 11/25/2016  . TIA (transient ischemic attack)     Past Surgical History:  Procedure Laterality Date  . ABDOMINAL  HYSTERECTOMY    . APPENDECTOMY    . BACK SURGERY    . cataracts    . CHOLECYSTECTOMY    . INTRAMEDULLARY (IM) NAIL INTERTROCHANTERIC Left 11/10/2018   Procedure: INTRAMEDULLARY (IM) NAIL INTERTROCHANTRIC;  Surgeon: Corky Mull, MD;  Location: [facility] ORS;  Service: Orthopedics;  Laterality: Left;  . PACEMAKER INSERTION Left 02/18/2015   Procedure: INSERTION PACEMAKER;  Surgeon: Isaias Cowman, MD;  Location: [facility] ORS;  Service: Cardiovascular;  Laterality: Left;  . REPLACEMENT TOTAL KNEE    . TONSILLECTOMY       reports that she has been smoking cigarettes. She has been smoking about 0.50 packs per day. She has never used smokeless tobacco. She reports that she does not drink alcohol and does not use drugs.  Allergies  Allergen Reactions  . Chlorhexidine   . Citalopram Other (See Comments)    Reaction:  Fatigue   . Pneumococcal Vaccine Other (See Comments)    Doesn't remember what occurred.  . Pneumovax [Pneumococcal Polysaccharide Vaccine] Other (See Comments)    Reaction:  Unknown   . Requip [Ropinirole Hcl] Other (See Comments)    Reaction:  Dry mouth   . Penicillins Rash and Other (See Comments)        Family History  Problem Relation Age of Onset  . Other Mother        Old age  . Other Father        Old age  . CVA Sister   . Heart attack Brother       Prior  to Admission medications   Medication Sig Start Date End Date Taking? Authorizing Provider  acetaminophen (TYLENOL) 325 MG tablet Take 650 mg by mouth every 4 (four) hours as needed for mild pain or moderate pain.     [provider]  atorvastatin (LIPITOR) 10 MG tablet Take 10 mg by mouth daily.    [provider]  clopidogrel (PLAVIX) 75 MG tablet Take 75 mg by mouth daily.    [provider]  hydroxyurea (HYDREA) 500 MG capsule Take 500 mg by mouth every Monday, Wednesday, and Friday.     [provider]  ipratropium-albuterol (DUONEB) 0.5-2.5 (3) MG/3ML SOLN Take 3 mLs by  nebulization every 6 (six) hours as needed (shortness of breath or wheezing).    [provider]  potassium chloride (KLOR-CON) 10 MEQ tablet Take 1 tablet (10 mEq total) by mouth daily. 02/09/20   Gregor Hams, MD    Physical Exam: Vitals:   03/20/2020 1752 04/03/2020 1808 04/01/2020 2118  BP: 115/90  (!) 183/80  Pulse: 93  91  Resp: 18  17  Temp:  98.8 F (37.1 C)   TempSrc:  Oral   SpO2: 98%  100%  Weight: 52.8 kg    Height: 5' (1.524 m)       Vitals:   03/19/2020 1752 03/30/2020 1808 03/17/2020 2118  BP: 115/90  (!) 183/80  Pulse: 93  91  Resp: 18  17  Temp:  98.8 F (37.1 C)   TempSrc:  Oral   SpO2: 98%  100%  Weight: 52.8 kg    Height: 5' (1.524 m)        Constitutional:  Patient is comatose.  Not awakening to shaking HEENT:      Head: Normocephalic and atraumatic.         Eyes:  Pupils reactive conjunctivae are normal. Sclera is non-icteric.       Neck: Supple with no signs of meningismus. Cardiovascular: Regular rate and rhythm. No murmurs, gallops, or rubs. 2+ symmetrical distal pulses are present . No JVD. No LE edema Respiratory: Respiratory effort increased.Lungs sounds diminished bilaterally. No wheezes, crackles, or rhonchi.  Gastrointestinal:  Soft and non distended with positive bowel sounds.  Musculoskeletal: . No cyanosis, or erythema of extremities. Neurologic: Patient comatose.  Pupils equal. Skin: Skin is warm, dry.  No rash or ulcers Psychiatric: Unable to assess  Labs on Admission: I have personally reviewed following labs and imaging studies  CBC: Recent Labs  Lab 03/30/2020 1758 03/10/2020 1925  WBC 1.2* 1.8*  HGB 7.5* 12.4  HCT 21.5* 35.1*  MCV 100.0 98.3  PLT 79* 366*   Basic Metabolic Panel: Recent Labs  Lab 03/19/2020 1758  NA 131*  K 4.1  CL 97*  CO2 24  GLUCOSE 89  BUN 14  CREATININE 1.28*  CALCIUM 8.3*   GFR: Estimated Creatinine Clearance: 19.3 mL/min (A) (by C-G formula based on SCr of 1.28 mg/dL (H)). Liver  Function Tests: Recent Labs  Lab 03/13/2020 1758  AST 20  ALT 12  ALKPHOS 88  BILITOT 1.1  PROT 5.2*  ALBUMIN 2.8*   No results for input(s): LIPASE, AMYLASE in the last 168 hours. No results for input(s): AMMONIA in the last 168 hours. Coagulation Profile: No results for input(s): INR, PROTIME in the last 168 hours. Cardiac Enzymes: No results for input(s): CKTOTAL, CKMB, CKMBINDEX, TROPONINI in the last 168 hours. BNP (last 3 results) No results for input(s): PROBNP in the last 8760 hours. HbA1C: No results  for input(s): HGBA1C in the last 72 hours. CBG: No results for input(s): GLUCAP in the last 168 hours. Lipid Profile: No results for input(s): CHOL, HDL, LDLCALC, TRIG, CHOLHDL, LDLDIRECT in the last 72 hours. Thyroid Function Tests: No results for input(s): TSH, T4TOTAL, FREET4, T3FREE, THYROIDAB in the last 72 hours. Anemia Panel: No results for input(s): VITAMINB12, FOLATE, FERRITIN, TIBC, IRON, RETICCTPCT in the last 72 hours. Urine analysis:    Component Value Date/Time   COLORURINE YELLOW (A) 01/14/2020 1515   APPEARANCEUR CLOUDY (A) 01/14/2020 1515   LABSPEC 1.013 01/14/2020 1515   PHURINE 7.0 01/14/2020 [place] 01/14/2020 1515   HGBUR NEGATIVE 01/14/2020 1515   BILIRUBINUR NEGATIVE 01/14/2020 1515   KETONESUR 5 (A) 01/14/2020 1515   PROTEINUR 30 (A) 01/14/2020 1515   NITRITE NEGATIVE 01/14/2020 1515   LEUKOCYTESUR NEGATIVE 01/14/2020 1515    Radiological Exams on Admission: DG Chest 1 View  Result Date: 04/04/2020 CLINICAL DATA:  Fall, weakness. EXAM: CHEST  1 VIEW COMPARISON:  01/15/2020 FINDINGS: Left-sided pacemaker remains in place. Right chest port remains in place. Stable heart size and mediastinal contours. Aortic tortuosity and atherosclerosis. Possible small left pleural effusion, similar. Improved right basilar aeration from prior exam with improved ill-defined opacity. No new airspace disease. No pneumothorax. No acute osseous  abnormalities are seen. Chronic superior subluxation of the right humeral head. IMPRESSION: 1. No acute findings or evidence of traumatic injury. 2. Improved right basilar aeration from prior exam. 3. Possible small left pleural effusion, similar to prior. Electronically Signed   By: Keith Rake M.D.   On: 03/26/2020 18:54   DG Pelvis 1-2 Views  Result Date: 03/23/2020 CLINICAL DATA:  Weakness, fall. EXAM: PELVIS - 1-2 VIEW COMPARISON:  None. FINDINGS: Intramedullary rod with trans trochanteric screw fixation of the left proximal femur. Hardware is intact were visualized. No evidence of acute fracture of the pelvis or hips. Cortical margins are intact. The pubic rami are intact. Mild bilateral hip osteoarthritis. Bones are diffusely under mineralized. Surgical hardware in the lower lumbar spine is partially included. IMPRESSION: 1. No acute pelvic fracture. 2. Intact included left proximal femur hardware. Electronically Signed   By: Keith Rake M.D.   On: 03/25/2020 18:56   CT Head Wo Contrast  Result Date: 03/22/2020 CLINICAL DATA:  86 year old female with head trauma. EXAM: CT HEAD WITHOUT CONTRAST TECHNIQUE: Contiguous axial images were obtained from the base of the skull through the vertex without intravenous contrast. COMPARISON:  Earlier CT dated 03/23/2020. FINDINGS: Brain: Significant interval increase in the size of the right hemispheric subdural hemorrhage now measuring up to approximately 2.7 cm in thickness. There is associated significant mass effect on the right brain parenchyma and approximately 2 cm right to left midline shift (previously 6 mm). There is moderate compression of the right lateral ventricle with complete compression of the temporal and occipital horns of the right lateral ventricle. There is right uncal herniation. Vascular: No acute findings. Suboptimal visualization of the right MCA. Skull: No acute fracture. Sinuses/Orbits: No acute finding. Other: None IMPRESSION:  Significant interval increase in the right hemispheric subdural hemorrhage with mass effect and 2 cm right to left midline shift and uncal herniation. These results were called by telephone at the time of interpretation on 04/01/2020 at 9:58 pm to provider Lenise Arena , who verbally acknowledged these results. Electronically Signed   By: Anner Crete M.D.   On: 04/04/2020 21:57   CT Head Wo Contrast  Result Date: 03/07/2020 CLINICAL  DATA:  Encephalopathy. Fall today. Found on floor. EXAM: CT HEAD WITHOUT CONTRAST TECHNIQUE: Contiguous axial images were obtained from the base of the skull through the vertex without intravenous contrast. COMPARISON:  01/14/2020 FINDINGS: Brain: Acute holo hemispheric right subdural hematoma measures up to 10 mm in thickness. There is 6 mm right to left midline shift. Mild mass effect on the subjacent right hemisphere. Underlying atrophy and chronic small vessel ischemia again seen. Remote left occipital infarct. Hyperdense left CP angle mass measuring 2.1 cm, grossly unchanged and likely meningioma. Vascular: Atherosclerosis of skullbase vasculature without hyperdense vessel or abnormal calcification. Skull: No fracture or focal lesion. Sinuses/Orbits: No acute findings. Chronic mucosal thickening of left side of sphenoid sinus. Bilateral cataract resection. Trace opacification of lower mastoid air cells, chronic. Other: None. IMPRESSION: 1. Acute holo hemispheric right subdural hematoma measuring up to 10 mm in thickness. Mass effect on the subjacent cerebral hemisphere. There is 6 mm right to left midline shift. 2. Unchanged atrophy and chronic small vessel ischemia. 3. Unchanged hyperdensity left CP angle mass, likely meningioma. Critical Value/emergent results were called by telephone at the time of interpretation on 03/17/2020 at 7:01 pm to Dr Lenise Arena , who verbally acknowledged these results. Electronically Signed   By: Keith Rake M.D.   On: 04/06/2020  19:02   CT Cervical Spine Wo Contrast  Result Date: 04/04/2020 CLINICAL DATA:  Fall this afternoon. Found on floor. EXAM: CT CERVICAL SPINE WITHOUT CONTRAST TECHNIQUE: Multidetector CT imaging of the cervical spine was performed without intravenous contrast. Multiplanar CT image reconstructions were also generated. COMPARISON:  None. FINDINGS: Alignment: Minimal anterolisthesis of C4 on C5, likely degenerative. No traumatic subluxation. Mild broad-based leftward curvature of cervical spine. Skull base and vertebrae: No acute fracture. Vertebral body heights are maintained. The dens and skull base are intact. Soft tissues and spinal canal: No prevertebral fluid or swelling. No visible canal hematoma. Disc levels: Disc space narrowing and endplate spurring at T4-H9, C5-C6, and C6-C7. Multilevel facet hypertrophy. There is degenerative pannus at C1-C2. Upper chest: No acute findings. Other: Carotid calcifications. IMPRESSION: Multilevel degenerative change in the cervical spine without acute fracture or subluxation. Electronically Signed   By: Keith Rake M.D.   On: 03/25/2020 19:06    EKG: Not done from the ER  Assessment/Plan 86 year old female presenting with a fall with finding of subdural hematoma, now progressing and with uncal herniation.     Traumatic subdural hematoma with loss of consciousness (HCC)    Acute encephalopathy -Initial CT with large subdural hematoma, 10 mm with with 6 mm shift, with acute decline in mentation with repeat showing 2 cm right to left shift with uncal herniation -Patient unlikely to survive to discharge -Continue comfort care measures    Suspect acute blood loss anemia -Hemoglobin 7.5, likely related to acute bleed    Comfort measures only status -Comfort care measures as per orders    Chronic obstructive pulmonary disease (HCC)   Peripheral vascular disease (HCC) -No active treatment    DVT prophylaxis: None code Status: DNR Family Communication:  Sons. Bill and Jimmie Disposition Plan: Patient not expected to survive to discharge Consults called: none  Status: Observation    Athena Masse MD [facility]     03/11/2020, 10:52 PM

## 2020-03-14 NOTE — ED Notes (Signed)
Pt to CT

## 2020-03-14 NOTE — ED Triage Notes (Addendum)
Pt from home via [facility]. Per EMS, pt fell today at 10am per fire department report pt was A/Ox4.  Pt fell this afternoon and  Pt found on floor, AMS by FD. Unknown down time.   [facility] EMS: 115/59 110HR [id number]

## 2020-03-14 NOTE — ED Notes (Signed)
Date and time results received: 03/10/2020 1919 (use smartphrase ".now" to insert current time)  Test: WBC Critical Value: 1.2  Name of Provider Notified: wWilliams md  Orders Received? Or Actions Taken?: Actions Taken: [place] md aware

## 2020-03-14 NOTE — ED Notes (Signed)
Family at bedside. EDP Williams at bedside at this time.

## 2020-03-15 ENCOUNTER — Encounter: Payer: Self-pay | Admitting: Internal Medicine

## 2020-03-15 DIAGNOSIS — I739 Peripheral vascular disease, unspecified: Secondary | ICD-10-CM | POA: Diagnosis present

## 2020-03-15 DIAGNOSIS — R4020 Unspecified coma: Secondary | ICD-10-CM | POA: Diagnosis present

## 2020-03-15 DIAGNOSIS — G935 Compression of brain: Secondary | ICD-10-CM | POA: Diagnosis present

## 2020-03-15 DIAGNOSIS — Z8673 Personal history of transient ischemic attack (TIA), and cerebral infarction without residual deficits: Secondary | ICD-10-CM | POA: Diagnosis not present

## 2020-03-15 DIAGNOSIS — W1830XA Fall on same level, unspecified, initial encounter: Secondary | ICD-10-CM | POA: Diagnosis present

## 2020-03-15 DIAGNOSIS — Z86011 Personal history of benign neoplasm of the brain: Secondary | ICD-10-CM | POA: Diagnosis not present

## 2020-03-15 DIAGNOSIS — Z20822 Contact with and (suspected) exposure to covid-19: Secondary | ICD-10-CM | POA: Diagnosis present

## 2020-03-15 DIAGNOSIS — E785 Hyperlipidemia, unspecified: Secondary | ICD-10-CM | POA: Diagnosis present

## 2020-03-15 DIAGNOSIS — Z90710 Acquired absence of both cervix and uterus: Secondary | ICD-10-CM | POA: Diagnosis not present

## 2020-03-15 DIAGNOSIS — Z950 Presence of cardiac pacemaker: Secondary | ICD-10-CM | POA: Diagnosis not present

## 2020-03-15 DIAGNOSIS — Z515 Encounter for palliative care: Secondary | ICD-10-CM | POA: Diagnosis present

## 2020-03-15 DIAGNOSIS — Z79899 Other long term (current) drug therapy: Secondary | ICD-10-CM | POA: Diagnosis not present

## 2020-03-15 DIAGNOSIS — S065X9A Traumatic subdural hemorrhage with loss of consciousness of unspecified duration, initial encounter: Secondary | ICD-10-CM | POA: Diagnosis present

## 2020-03-15 DIAGNOSIS — Z9981 Dependence on supplemental oxygen: Secondary | ICD-10-CM | POA: Diagnosis not present

## 2020-03-15 DIAGNOSIS — Y92009 Unspecified place in unspecified non-institutional (private) residence as the place of occurrence of the external cause: Secondary | ICD-10-CM | POA: Diagnosis not present

## 2020-03-15 DIAGNOSIS — Z7902 Long term (current) use of antithrombotics/antiplatelets: Secondary | ICD-10-CM | POA: Diagnosis not present

## 2020-03-15 DIAGNOSIS — Z96651 Presence of right artificial knee joint: Secondary | ICD-10-CM | POA: Diagnosis present

## 2020-03-15 DIAGNOSIS — F17210 Nicotine dependence, cigarettes, uncomplicated: Secondary | ICD-10-CM | POA: Diagnosis present

## 2020-03-15 DIAGNOSIS — J449 Chronic obstructive pulmonary disease, unspecified: Secondary | ICD-10-CM | POA: Diagnosis present

## 2020-03-15 DIAGNOSIS — Z66 Do not resuscitate: Secondary | ICD-10-CM | POA: Diagnosis present

## 2020-03-15 DIAGNOSIS — G9340 Encephalopathy, unspecified: Secondary | ICD-10-CM | POA: Diagnosis present

## 2020-03-15 LAB — SARS CORONAVIRUS 2 BY RT PCR (HOSPITAL ORDER, PERFORMED IN ~~LOC~~ HOSPITAL LAB): SARS Coronavirus 2: NEGATIVE

## 2020-03-15 MED ORDER — MORPHINE SULFATE (PF) 2 MG/ML IV SOLN
2.0000 mg | INTRAVENOUS | Status: DC | PRN
  Filled 2020-03-15: qty 1

## 2020-03-15 MED ORDER — SODIUM CHLORIDE 0.9 % IV SOLN
INTRAVENOUS | Status: AC
  Administered 2020-03-15: 1000 mL via INTRAVENOUS

## 2020-03-15 NOTE — Progress Notes (Signed)
Patient actively dying.  Seems comfortable.  All family at bedside.  I have requested chaplain for prayers per family request.  We will order morphine 2 mg every 2 hours IV for comfort care.  No charge.

## 2020-03-15 NOTE — Progress Notes (Signed)
Nutrition Brief Note  Patient identified on malnutrition screening tool for decreased appetite and weight loss.  Chart reviewed. Pt has transitioned to comfort care.  No nutrition interventions warranted at this time.   Please consult as needed.   Lajuan Lines, RD, LDN Clinical Nutrition After Hours/Weekend Pager # in [place]

## 2020-03-15 NOTE — Progress Notes (Signed)
   03/15/20 1300  Clinical Encounter Type  Visited With Patient;Family  Visit Type Initial;Spiritual support;Social support  Referral From Family;Nurse  Consult/Referral To Chaplain  Ch responded to a Page and OR. When I arrived at the room the Pt's son and daughter were standing in the hall. The Dr. Was doing something to the Pt. After the Dr finish, we all went into the room. The family informed me about what happen to their mother. The family asked for prayer for their mother. I prayed for the mother and the family. Ch will follow-up as needed.

## 2020-03-16 MED ORDER — MORPHINE SULFATE (CONCENTRATE) 10 MG/0.5ML PO SOLN
5.0000 mg | ORAL | Status: DC | PRN
  Administered 2020-03-16 – 2020-03-18 (×5): 5 mg via ORAL
  Filled 2020-03-16 (×5): qty 0.5

## 2020-03-16 MED ORDER — MORPHINE SULFATE (PF) 2 MG/ML IV SOLN: 2.0000 mg | INTRAVENOUS | Status: DC | PRN

## 2020-03-16 MED ORDER — MORPHINE SULFATE (CONCENTRATE) 10 MG/0.5ML PO SOLN: 5.0000 mg | ORAL | Status: DC | PRN

## 2020-03-16 NOTE — Progress Notes (Signed)
Patient seems comfortable does not have IV line in place -switch IV morphine to Roxanol   Actively dying.  Family in agreement with hospice home placement when bed available.  No charge.

## 2020-03-17 NOTE — TOC Progression Note (Signed)
Transition of Care [facility]) - Progression Note    Patient Details  Name: Maryiah Olvey MRN: [id number] Date of Birth: 22-Apr-1924  Transition of Care [facility]) CM/SW Contact  [address], [place], [place] Phone Number: 03/17/2020, 10:28 AM  Clinical Narrative:    Return call from [place], hospital liaison for [facility]. Per Melissa, no beds available at this time but will update this social worker when a bed becomes available.    Orla Estrin, LCSW Clinical Social Worker  Transition of Care [phone]        Expected Discharge Plan and Services                                                 Social Determinants of Health (SDOH) Interventions    Readmission Risk Interventions No flowsheet data found.

## 2020-03-17 NOTE — TOC Progression Note (Signed)
Transition of Care [facility]) - Progression Note    Patient Details  Name: Krista Ortiz MRN: [id number] Date of Birth: 12-19-1923  Transition of Care [facility]) CM/SW Contact  [address] [place], [place] Phone Number: 03/17/2020, 10:58 AM  Clinical Narrative:    Return call from Trixie Dredge, liaison with [facility]. Referral made, however it was confirmed that they have no beds today.   Burt Piatek, LCSW Clinical Social Worker  Transition of Care [phone]        Expected Discharge Plan and Services                                                 Social Determinants of Health (SDOH) Interventions    Readmission Risk Interventions No flowsheet data found.

## 2020-03-17 NOTE — Progress Notes (Addendum)
[facility] Liaison note.  [facility])  Received request from [place] for family interest in [place].  Hospice Home notified of request and patient is approved for hospice per Dr. Tomasa Hosteller with [facility].  Spoke with son Gwyndolyn Saxon about [place] and that there are no current beds available. He is ok with waiting until one becomes available. [facility] numbers given to [facility] as he is the primary contact.   A Please do not hesitate to call with questions.        Thank you,    Clementeen Hoof, RN, BSN  [place] (listed on [place] under [facility] [place])   [phone]

## 2020-03-17 NOTE — TOC Progression Note (Signed)
Transition of Care [facility]) - Progression Note    Patient Details  Name: Krista Ortiz MRN: [id number] Date of Birth: Aug 06, 1924  Transition of Care [facility]) CM/SW Contact  Shanquita Ronning, [place], [place] Phone Number: 03/17/2020, 10:17 AM  Clinical Narrative:    Referral received to assist with patient's transition to residential hospice. Spoke with patient's son Mauria Asquith who agrees with referral to [facility]-residential hospice.  Phone call to [place], hospital liaison at [facility]. Voicemail message left requesting a return call.   [address], LCSW Transition of Care [phone]            Expected Discharge Plan and Services                                                 Social Determinants of Health (SDOH) Interventions    Readmission Risk Interventions No flowsheet data found.

## 2020-03-17 NOTE — Progress Notes (Signed)
Patient seems comfortable.  Actively dying.  Family at bedside satisfied with the care.  Agreeable with hospice on transfer but no beds available today.  No charge.

## 2020-03-18 ENCOUNTER — Encounter: Payer: Self-pay | Admitting: Internal Medicine

## 2020-03-18 DIAGNOSIS — S065X9A Traumatic subdural hemorrhage with loss of consciousness of unspecified duration, initial encounter: Principal | ICD-10-CM

## 2020-03-18 DIAGNOSIS — Z515 Encounter for palliative care: Secondary | ICD-10-CM

## 2020-03-28 ENCOUNTER — Other Ambulatory Visit: Payer: Medicare Other

## 2020-03-28 ENCOUNTER — Ambulatory Visit: Payer: Medicare Other | Admitting: Oncology

## 2020-04-07 NOTE — Care Management Important Message (Signed)
Important Message  Patient Details  Name: Krista Ortiz MRN: [id number] Date of Birth: 08-27-1924   Medicare Important Message Given:  Other (see comment)  Patient on comfort care and out of respect of the patient and family No Important message given.  Juliann Pulse A Dineen Conradt 03/25/20, 9:08 AM

## 2020-04-07 NOTE — Progress Notes (Signed)
Pt death pronounced at 2003 by RN, Cyndie Chime and RN, Regino Schultze. NP Rufina Falco notified [phone].

## 2020-04-07 NOTE — Progress Notes (Signed)
Palliative:   Krista Ortiz has been accepted to residential hospice with [facility].  She is awaiting bed placement.   Palliative will shadow for symptom management needs, as goals are set.   Plan:  Residential Hospice with [facility]. Comfort and dignity at end of life.   No Charge  Quinn Axe, Np Palliative Medicine Team Team Phone [phone] Greater than 50% of this time was spent counseling and coordinating care related to the above assessment and plan.

## 2020-04-07 NOTE — Death Summary Note (Addendum)
DEATH SUMMARY   Patient Details  Name: Tiffiny Worthy MRN: [id number] DOB: Jul 18, 1924  Admission/Discharge Information   Admit Date:  2020-04-01  Date of Death: Date of Death: 05-Apr-2020  Time of Death: Time of Death: 12-16-01  Length of Stay: 3  Referring Physician: Hortencia Pilar, MD   Reason(s) for Hospitalization   Traumatic subdural Hematoma s/p fall  Diagnoses  Preliminary cause of death:  Secondary Diagnoses (including complications and co-morbidities):  Principal Problem:   Traumatic subdural hematoma with loss of consciousness ([place]) Active Problems:   Chronic obstructive pulmonary disease ([place])   Peripheral vascular disease ([place])   Acute encephalopathy   Comfort measures only status   Intracranial subdural hematoma ([place])   Palliative care by specialist   Brief Hospital Course (including significant findings, care, treatment, and services provided and events leading to death)  Linnette Panella is a 86 y.o. year old female with PMH of Acoustic neuroma, chronic back pain, HLD, COPD, Right knee joint replacement, polycythemia, permanent cardiac pacemaker, PVD, TIA, and thoracic compression fracture who was admitted on 2020/04/01 with traumatic subdural hematoma s/p fall.  ED Course: On arrival to the ED, she was afebrile with blood pressure 115/90 mm Hg and pulse rate 93 beats/min. There were no focal obvious neurological deficits on initial evaluation; she was alert but disoriented.  Noncontrast CT head was obtained and showed a 10 mm subdural with 6 mm midline shift.  Case was discussed with [facility] neurosurgery who accepted patient for transfer to [facility].  While waiting transfer patient had a change in neuro status repeat CT head was obtained which showed an interval increase in the right hemispheric subdural hemorrhage with mass-effect and 2 cm right-to-left midline shift and uncal herniation.  SIGNIFICANT EVENTS/STUDIES:  2023/04/02: Patient presented to the ED with altered mental  status status post fall Apr 02, 2023: CT Head/Cervical Spine revealed acute holohemispheric right subdural hematoma measuring 10 mm with 6 mm right to left midline shift. No acute cervical spine fracture 2023/04/02: Stat consult placed to [facility] neurosurgery who accepted patient for transfer 04/02/23: Patient noted with change in neuro status 04-02-23: CT head repeat showed an interval increase in the right hemispheric subdural hemorrhage with mass-effect and 2 cm right to left midline shift and uncal herniation April 02, 2023: Due to poor prognosis and rapid neurological change transfer to [facility] was counseled following discussion with family members by ED physician to transition to comfort care. 2023/04/02: Hospitalist team contacted for admission and transition to comfort care  07/9: Patient transition to full comfort care with palliative following.  Plan for residential hospice with [facility]. 04/04/23: Patient passed away peacefully with family member at the bedside and was pronounced at 12/16/2001.   Pertinent Labs and Studies  Significant Diagnostic Studies DG Chest 1 View  Result Date: April 01, 2020 CLINICAL DATA:  Fall, weakness. EXAM: CHEST  1 VIEW COMPARISON:  01/15/2020 FINDINGS: Left-sided pacemaker remains in place. Right chest port remains in place. Stable heart size and mediastinal contours. Aortic tortuosity and atherosclerosis. Possible small left pleural effusion, similar. Improved right basilar aeration from prior exam with improved ill-defined opacity. No new airspace disease. No pneumothorax. No acute osseous abnormalities are seen. Chronic superior subluxation of the right humeral head. IMPRESSION: 1. No acute findings or evidence of traumatic injury. 2. Improved right basilar aeration from prior exam. 3. Possible small left pleural effusion, similar to prior. Electronically Signed   By: Keith Rake M.D.   On: 04/01/2020 18:54   DG Pelvis 1-2 Views  Result  Date: 03/31/2020 CLINICAL DATA:  Weakness, fall. EXAM:  PELVIS - 1-2 VIEW COMPARISON:  None. FINDINGS: Intramedullary rod with trans trochanteric screw fixation of the left proximal femur. Hardware is intact were visualized. No evidence of acute fracture of the pelvis or hips. Cortical margins are intact. The pubic rami are intact. Mild bilateral hip osteoarthritis. Bones are diffusely under mineralized. Surgical hardware in the lower lumbar spine is partially included. IMPRESSION: 1. No acute pelvic fracture. 2. Intact included left proximal femur hardware. Electronically Signed   By: Keith Rake M.D.   On: 04/06/2020 18:56   CT Head Wo Contrast  Result Date: 03/25/2020 CLINICAL DATA:  86 year old female with head trauma. EXAM: CT HEAD WITHOUT CONTRAST TECHNIQUE: Contiguous axial images were obtained from the base of the skull through the vertex without intravenous contrast. COMPARISON:  Earlier CT dated 04/03/2020. FINDINGS: Brain: Significant interval increase in the size of the right hemispheric subdural hemorrhage now measuring up to approximately 2.7 cm in thickness. There is associated significant mass effect on the right brain parenchyma and approximately 2 cm right to left midline shift (previously 6 mm). There is moderate compression of the right lateral ventricle with complete compression of the temporal and occipital horns of the right lateral ventricle. There is right uncal herniation. Vascular: No acute findings. Suboptimal visualization of the right MCA. Skull: No acute fracture. Sinuses/Orbits: No acute finding. Other: None IMPRESSION: Significant interval increase in the right hemispheric subdural hemorrhage with mass effect and 2 cm right to left midline shift and uncal herniation. These results were called by telephone at the time of interpretation on 03/29/2020 at 9:58 pm to provider Lenise Arena , who verbally acknowledged these results. Electronically Signed   By: Anner Crete M.D.   On: 04/05/2020 21:57   CT Head Wo  Contrast  Result Date: 04/05/2020 CLINICAL DATA:  Encephalopathy. Fall today. Found on floor. EXAM: CT HEAD WITHOUT CONTRAST TECHNIQUE: Contiguous axial images were obtained from the base of the skull through the vertex without intravenous contrast. COMPARISON:  01/14/2020 FINDINGS: Brain: Acute holo hemispheric right subdural hematoma measures up to 10 mm in thickness. There is 6 mm right to left midline shift. Mild mass effect on the subjacent right hemisphere. Underlying atrophy and chronic small vessel ischemia again seen. Remote left occipital infarct. Hyperdense left CP angle mass measuring 2.1 cm, grossly unchanged and likely meningioma. Vascular: Atherosclerosis of skullbase vasculature without hyperdense vessel or abnormal calcification. Skull: No fracture or focal lesion. Sinuses/Orbits: No acute findings. Chronic mucosal thickening of left side of sphenoid sinus. Bilateral cataract resection. Trace opacification of lower mastoid air cells, chronic. Other: None. IMPRESSION: 1. Acute holo hemispheric right subdural hematoma measuring up to 10 mm in thickness. Mass effect on the subjacent cerebral hemisphere. There is 6 mm right to left midline shift. 2. Unchanged atrophy and chronic small vessel ischemia. 3. Unchanged hyperdensity left CP angle mass, likely meningioma. Critical Value/emergent results were called by telephone at the time of interpretation on 03/25/2020 at 7:01 pm to Dr Lenise Arena , who verbally acknowledged these results. Electronically Signed   By: Keith Rake M.D.   On: 03/09/2020 19:02   CT Cervical Spine Wo Contrast  Result Date: 04/04/2020 CLINICAL DATA:  Fall this afternoon. Found on floor. EXAM: CT CERVICAL SPINE WITHOUT CONTRAST TECHNIQUE: Multidetector CT imaging of the cervical spine was performed without intravenous contrast. Multiplanar CT image reconstructions were also generated. COMPARISON:  None. FINDINGS: Alignment: Minimal anterolisthesis of C4 on C5, likely  degenerative.  No traumatic subluxation. Mild broad-based leftward curvature of cervical spine. Skull base and vertebrae: No acute fracture. Vertebral body heights are maintained. The dens and skull base are intact. Soft tissues and spinal canal: No prevertebral fluid or swelling. No visible canal hematoma. Disc levels: Disc space narrowing and endplate spurring at V7-C5, C5-C6, and C6-C7. Multilevel facet hypertrophy. There is degenerative pannus at C1-C2. Upper chest: No acute findings. Other: Carotid calcifications. IMPRESSION: Multilevel degenerative change in the cervical spine without acute fracture or subluxation. Electronically Signed   By: Keith Rake M.D.   On: 03/29/2020 19:06    Microbiology Recent Results (from the past 240 hour(s))  SARS Coronavirus 2 by RT PCR (hospital order, performed in [facility] hospital lab) Nasopharyngeal Nasopharyngeal Swab     Status: None   Collection Time: 03/15/20  1:39 AM   Specimen: Nasopharyngeal Swab  Result Value Ref Range Status   SARS Coronavirus 2 NEGATIVE NEGATIVE Final    Comment: (NOTE) SARS-CoV-2 target nucleic acids are NOT DETECTED.  The SARS-CoV-2 RNA is generally detectable in upper and lower respiratory specimens during the acute phase of infection. The lowest concentration of SARS-CoV-2 viral copies this assay can detect is 250 copies / mL. A negative result does not preclude SARS-CoV-2 infection and should not be used as the sole basis for treatment or other patient management decisions.  A negative result may occur with improper specimen collection / handling, submission of specimen other than nasopharyngeal swab, presence of viral mutation(s) within the areas targeted by this assay, and inadequate number of viral copies (<250 copies / mL). A negative result must be combined with clinical observations, patient history, and epidemiological information.  Fact Sheet for Patients:    StrictlyIdeas.no  Fact Sheet for Healthcare Providers: BankingDealers.co.za  This test is not yet approved or  cleared by the [place] FDA and has been authorized for detection and/or diagnosis of SARS-CoV-2 by FDA under an Emergency Use Authorization (EUA).  This EUA will remain in effect (meaning this test can be used) for the duration of the COVID-19 declaration under Section 564(b)(1) of the Act, 21 U.S.C. section 360bbb-3(b)(1), unless the authorization is terminated or revoked sooner.  Performed at [facility], [address] [place], [place] [address]     Lab Basic Metabolic Panel: Recent Labs  Lab 03/13/2020 1758  NA 131*  K 4.1  CL 97*  CO2 24  GLUCOSE 89  BUN 14  CREATININE 1.28*  CALCIUM 8.3*   Liver Function Tests: Recent Labs  Lab 03/21/2020 1758  AST 20  ALT 12  ALKPHOS 88  BILITOT 1.1  PROT 5.2*  ALBUMIN 2.8*   No results for input(s): LIPASE, AMYLASE in the last 168 hours. No results for input(s): AMMONIA in the last 168 hours. CBC: Recent Labs  Lab 03/09/2020 1758 03/08/2020 1925  WBC 1.2* 1.8*  HGB 7.5* 12.4  HCT 21.5* 35.1*  MCV 100.0 98.3  PLT 79* 126*   Cardiac Enzymes: No results for input(s): CKTOTAL, CKMB, CKMBINDEX, TROPONINI in the last 168 hours. Sepsis Labs: Recent Labs  Lab 03/24/2020 1758 03/22/2020 1925  WBC 1.2* 1.8*    Procedures/Operations         Rufina Falco, BSN, MSN, [employer], [employer]  [facility] Nurse Practitioner  [facility]

## 2020-04-07 NOTE — Progress Notes (Signed)
PROGRESS NOTE    Krista Ortiz  TKW:[id number] DOB: [birth year] DOA: 03/12/2020 PCP: Hortencia Pilar, MD   Brief Narrative:  Krista Ortiz is a 86 y.o. female lives alone, with medical history significant for COPD, on nighttime oxygen, PVD , who presented to the emergency room by EMS following a fall at home, followed by disorientation.  Initial work-up with acute holohemispheric right subdural hematoma and a midline shift. He was initially accepted at [facility] neurosurgery. Later due to decline in her mental status a repeat CT head done which shows significant increase in her hematoma. After talking with family patient was transitioned to comfort care.  Subjective: Patient was unresponsive but seems comfortable. She was accompanied by one daughter and 2 son in the room.  Assessment & Plan:   Principal Problem:   Traumatic subdural hematoma with loss of consciousness (HCC) Active Problems:   Chronic obstructive pulmonary disease (HCC)   Peripheral vascular disease (HCC)   Acute encephalopathy   Comfort measures only status   Intracranial subdural hematoma ([place])   Palliative care by specialist  Traumatic subdural hematoma with loss of consciousness. Due to worsening mental status and patient becoming more obtunded she was transitioned to complete comfort measures. Patient was seen and examined today. She appears comfortable and accompanied by her family at bedside. She is being accepted for hospice home, waiting for bed availability. -Continue comfort measures.  Objective: Vitals:   03/16/20 2303 03/17/20 0822 03/17/20 2055 04/16/20 1525  BP: (!) 107/43 (!) 108/42 (!) 93/38 (!) 91/47  Pulse: 77 86 (!) 110 (!) 122  Resp: 12 14 18 20   Temp: (!) 97.4 F (36.3 C) (!) 97.5 F (36.4 C) (!) 97.2 F (36.2 C) 97.7 F (36.5 C)  TempSrc: Oral   Oral  SpO2: 90% (!) 80% 94% 90%  Weight:      Height:       No intake or output data in the 24 hours ending 04/16/20 1836 Filed Weights    03/13/2020 1752  Weight: 52.8 kg    Examination:  General exam: Appears calm and comfortable  Respiratory system: Clear to auscultation. Respiratory effort normal. Cardiovascular system: S1 & S2 heard, RRR.  Central nervous system: Unresponsive.Not Following any commands  DVT prophylaxis: Patient is comfort measure Code Status: DNR Family Communication: Daughter and 2 sons were updated at bedside Disposition Plan:  Status is: Inpatient  Remains inpatient appropriate because:Inpatient level of care appropriate due to severity of illness   Dispo: The patient is from: Home              Anticipated d/c is to: Hospice home              Anticipated d/c date is: 1 day              Patient currently is not medically stable to d/c. Patient is waiting for hospice home bed availability. Currently complete comfort measures. Might die during current hospitalization.  Consultants:   Neurosurgery  Procedures:  Antimicrobials:   Data Reviewed: I have personally reviewed following labs and imaging studies  CBC: Recent Labs  Lab 04/05/2020 1758 04/06/2020 1925  WBC 1.2* 1.8*  HGB 7.5* 12.4  HCT 21.5* 35.1*  MCV 100.0 98.3  PLT 79* 924*   Basic Metabolic Panel: Recent Labs  Lab 03/25/2020 1758  NA 131*  K 4.1  CL 97*  CO2 24  GLUCOSE 89  BUN 14  CREATININE 1.28*  CALCIUM 8.3*   GFR: Estimated Creatinine Clearance:  19.3 mL/min (A) (by C-G formula based on SCr of 1.28 mg/dL (H)). Liver Function Tests: Recent Labs  Lab 03/23/2020 1758  AST 20  ALT 12  ALKPHOS 88  BILITOT 1.1  PROT 5.2*  ALBUMIN 2.8*   No results for input(s): LIPASE, AMYLASE in the last 168 hours. No results for input(s): AMMONIA in the last 168 hours. Coagulation Profile: No results for input(s): INR, PROTIME in the last 168 hours. Cardiac Enzymes: No results for input(s): CKTOTAL, CKMB, CKMBINDEX, TROPONINI in the last 168 hours. BNP (last 3 results) No results for input(s): PROBNP in the last 8760  hours. HbA1C: No results for input(s): HGBA1C in the last 72 hours. CBG: No results for input(s): GLUCAP in the last 168 hours. Lipid Profile: No results for input(s): CHOL, HDL, LDLCALC, TRIG, CHOLHDL, LDLDIRECT in the last 72 hours. Thyroid Function Tests: No results for input(s): TSH, T4TOTAL, FREET4, T3FREE, THYROIDAB in the last 72 hours. Anemia Panel: No results for input(s): VITAMINB12, FOLATE, FERRITIN, TIBC, IRON, RETICCTPCT in the last 72 hours. Sepsis Labs: No results for input(s): PROCALCITON, LATICACIDVEN in the last 168 hours.  Recent Results (from the past 240 hour(s))  SARS Coronavirus 2 by RT PCR (hospital order, performed in [facility] hospital lab) Nasopharyngeal Nasopharyngeal Swab     Status: None   Collection Time: 03/15/20  1:39 AM   Specimen: Nasopharyngeal Swab  Result Value Ref Range Status   SARS Coronavirus 2 NEGATIVE NEGATIVE Final    Comment: (NOTE) SARS-CoV-2 target nucleic acids are NOT DETECTED.  The SARS-CoV-2 RNA is generally detectable in upper and lower respiratory specimens during the acute phase of infection. The lowest concentration of SARS-CoV-2 viral copies this assay can detect is 250 copies / mL. A negative result does not preclude SARS-CoV-2 infection and should not be used as the sole basis for treatment or other patient management decisions.  A negative result may occur with improper specimen collection / handling, submission of specimen other than nasopharyngeal swab, presence of viral mutation(s) within the areas targeted by this assay, and inadequate number of viral copies (<250 copies / mL). A negative result must be combined with clinical observations, patient history, and epidemiological information.  Fact Sheet for Patients:   StrictlyIdeas.no  Fact Sheet for Healthcare Providers: BankingDealers.co.za  This test is not yet approved or  cleared by the [place] FDA and has  been authorized for detection and/or diagnosis of SARS-CoV-2 by FDA under an Emergency Use Authorization (EUA).  This EUA will remain in effect (meaning this test can be used) for the duration of the COVID-19 declaration under Section 564(b)(1) of the Act, 21 U.S.C. section 360bbb-3(b)(1), unless the authorization is terminated or revoked sooner.  Performed at [facility], [address], [place], [place] [address]      Radiology Studies: No results found.  Scheduled Meds: . sodium chloride flush  3 mL Intravenous Once   Continuous Infusions:   LOS: 3 days   Time spent: 30 minutes.  Lorella Nimrod, MD [facility]  If 7PM-7AM, please contact night-coverage [email or web address]  2020-04-17, 6:36 PM   This record has been created using Dragon voice recognition software. Errors have been sought and corrected,but may not always be located. Such creation errors do not reflect on the standard of care.

## 2020-04-07 NOTE — Progress Notes (Signed)
[facility] hospital liaison note:  Visit made to new referral for [facility] hospice home. Family at bedside. Writer provided education regarding hospice services, philosophy, team approach to care and current visitation policy with understanding voiced. Questions answered.  [facility] is unable to offer a bed today. Family and hospital care team made aware. Will continue to follow and update daily regarding bed availability. Thank you for the opportunity to be involved in the care of this patient and her family.  Flo Shanks BSN, RN, [place] [phone]

## 2020-04-07 DEATH — deceased
